# Patient Record
Sex: Female | Born: 1975 | Race: Black or African American | Hispanic: No | Smoking: Current every day smoker
Health system: Southern US, Community
[De-identification: ages and names within clinical notes are randomized; demographics above are authoritative.]

## PROBLEM LIST (undated history)

## (undated) ENCOUNTER — Inpatient Hospital Stay (HOSPITAL_COMMUNITY): Payer: Self-pay

## (undated) DIAGNOSIS — R519 Headache, unspecified: Secondary | ICD-10-CM

## (undated) DIAGNOSIS — M5430 Sciatica, unspecified side: Secondary | ICD-10-CM

## (undated) DIAGNOSIS — E119 Type 2 diabetes mellitus without complications: Secondary | ICD-10-CM

## (undated) DIAGNOSIS — Z87442 Personal history of urinary calculi: Secondary | ICD-10-CM

## (undated) DIAGNOSIS — K219 Gastro-esophageal reflux disease without esophagitis: Secondary | ICD-10-CM

## (undated) DIAGNOSIS — I10 Essential (primary) hypertension: Secondary | ICD-10-CM

## (undated) DIAGNOSIS — D649 Anemia, unspecified: Secondary | ICD-10-CM

## (undated) HISTORY — PX: COLPOSCOPY: SHX161

## (undated) HISTORY — PX: CERVICAL BIOPSY: SHX590

## (undated) HISTORY — DX: Sciatica, unspecified side: M54.30

---

## 1998-05-29 ENCOUNTER — Encounter: Admission: RE | Admit: 1998-05-29 | Discharge: 1998-05-29 | Payer: Self-pay | Admitting: Internal Medicine

## 2004-10-05 ENCOUNTER — Emergency Department (HOSPITAL_COMMUNITY): Admission: EM | Admit: 2004-10-05 | Discharge: 2004-10-05 | Payer: Self-pay | Admitting: Emergency Medicine

## 2009-05-19 ENCOUNTER — Emergency Department (HOSPITAL_COMMUNITY): Admission: EM | Admit: 2009-05-19 | Discharge: 2009-05-19 | Payer: Self-pay | Admitting: Emergency Medicine

## 2010-01-17 ENCOUNTER — Emergency Department (HOSPITAL_COMMUNITY): Admission: EM | Admit: 2010-01-17 | Discharge: 2010-01-18 | Payer: Self-pay | Admitting: Emergency Medicine

## 2010-10-12 LAB — POCT I-STAT, CHEM 8
Calcium, Ion: 1.2 mmol/L (ref 1.12–1.32)
Chloride: 107 mEq/L (ref 96–112)
Creatinine, Ser: 0.9 mg/dL (ref 0.4–1.2)
Glucose, Bld: 79 mg/dL (ref 70–99)
Hemoglobin: 12.6 g/dL (ref 12.0–15.0)
Potassium: 3.1 mEq/L — ABNORMAL LOW (ref 3.5–5.1)
TCO2: 23 mmol/L (ref 0–100)

## 2010-10-12 LAB — URINALYSIS, ROUTINE W REFLEX MICROSCOPIC
Glucose, UA: NEGATIVE mg/dL
Ketones, ur: 15 mg/dL — AB
Leukocytes, UA: NEGATIVE
Nitrite: NEGATIVE
Protein, ur: NEGATIVE mg/dL
Specific Gravity, Urine: 1.027 (ref 1.005–1.030)
Urobilinogen, UA: 1 mg/dL (ref 0.0–1.0)
pH: 5.5 (ref 5.0–8.0)

## 2010-10-12 LAB — POCT PREGNANCY, URINE: Preg Test, Ur: NEGATIVE

## 2010-10-12 LAB — URINE MICROSCOPIC-ADD ON

## 2010-10-30 LAB — URINE MICROSCOPIC-ADD ON

## 2010-10-30 LAB — URINALYSIS, ROUTINE W REFLEX MICROSCOPIC
Nitrite: NEGATIVE
Specific Gravity, Urine: 1.026 (ref 1.005–1.030)
Urobilinogen, UA: 1 mg/dL (ref 0.0–1.0)
pH: 6.5 (ref 5.0–8.0)

## 2010-10-30 LAB — CBC
HCT: 39.5 % (ref 36.0–46.0)
Hemoglobin: 13.7 g/dL (ref 12.0–15.0)
MCHC: 34.7 g/dL (ref 30.0–36.0)
MCV: 101 fL — ABNORMAL HIGH (ref 78.0–100.0)
RDW: 11.9 % (ref 11.5–15.5)
WBC: 6.6 10*3/uL (ref 4.0–10.5)

## 2010-10-30 LAB — DIFFERENTIAL
Lymphocytes Relative: 31 % (ref 12–46)
Lymphs Abs: 2.1 10*3/uL (ref 0.7–4.0)
Monocytes Absolute: 0.3 10*3/uL (ref 0.1–1.0)
Monocytes Relative: 5 % (ref 3–12)
Neutrophils Relative %: 63 % (ref 43–77)

## 2010-10-30 LAB — POCT I-STAT, CHEM 8
Chloride: 110 mEq/L (ref 96–112)
Hemoglobin: 14.3 g/dL (ref 12.0–15.0)
Potassium: 3.3 mEq/L — ABNORMAL LOW (ref 3.5–5.1)
Sodium: 140 mEq/L (ref 135–145)
TCO2: 21 mmol/L (ref 0–100)

## 2010-10-30 LAB — ETHANOL: Alcohol, Ethyl (B): <5 mg/dL (ref 0–10)

## 2010-10-30 LAB — POCT PREGNANCY, URINE: Preg Test, Ur: NEGATIVE

## 2011-11-11 ENCOUNTER — Emergency Department (HOSPITAL_COMMUNITY): Payer: BC Managed Care – PPO

## 2011-11-11 ENCOUNTER — Encounter (HOSPITAL_COMMUNITY): Payer: Self-pay | Admitting: Emergency Medicine

## 2011-11-11 ENCOUNTER — Emergency Department (HOSPITAL_COMMUNITY)
Admission: EM | Admit: 2011-11-11 | Discharge: 2011-11-12 | Disposition: A | Discharge status: Home / Self Care | Payer: BC Managed Care – PPO | Attending: Emergency Medicine | Admitting: Emergency Medicine

## 2011-11-11 DIAGNOSIS — Z79899 Other long term (current) drug therapy: Secondary | ICD-10-CM | POA: Insufficient documentation

## 2011-11-11 DIAGNOSIS — I10 Essential (primary) hypertension: Secondary | ICD-10-CM | POA: Insufficient documentation

## 2011-11-11 DIAGNOSIS — R0602 Shortness of breath: Secondary | ICD-10-CM | POA: Insufficient documentation

## 2011-11-11 DIAGNOSIS — R064 Hyperventilation: Secondary | ICD-10-CM | POA: Insufficient documentation

## 2011-11-11 DIAGNOSIS — R209 Unspecified disturbances of skin sensation: Secondary | ICD-10-CM | POA: Insufficient documentation

## 2011-11-11 DIAGNOSIS — R0789 Other chest pain: Secondary | ICD-10-CM

## 2011-11-11 DIAGNOSIS — Z7982 Long term (current) use of aspirin: Secondary | ICD-10-CM | POA: Insufficient documentation

## 2011-11-11 DIAGNOSIS — R071 Chest pain on breathing: Secondary | ICD-10-CM | POA: Insufficient documentation

## 2011-11-11 DIAGNOSIS — M25519 Pain in unspecified shoulder: Secondary | ICD-10-CM | POA: Insufficient documentation

## 2011-11-11 DIAGNOSIS — E876 Hypokalemia: Secondary | ICD-10-CM | POA: Insufficient documentation

## 2011-11-11 DIAGNOSIS — M79609 Pain in unspecified limb: Secondary | ICD-10-CM | POA: Insufficient documentation

## 2011-11-11 DIAGNOSIS — R110 Nausea: Secondary | ICD-10-CM | POA: Insufficient documentation

## 2011-11-11 HISTORY — DX: Essential (primary) hypertension: I10

## 2011-11-11 LAB — BASIC METABOLIC PANEL
Chloride: 97 mEq/L (ref 96–112)
GFR calc Af Amer: >90 mL/min (ref >90)
GFR calc non Af Amer: >90 mL/min (ref >90)
Potassium: 2.4 mEq/L — CL (ref 3.5–5.1)
Sodium: 139 mEq/L (ref 135–145)

## 2011-11-11 LAB — DIFFERENTIAL
Basophils Absolute: 0 10*3/uL (ref 0.0–0.1)
Basophils Relative: 0 % (ref 0–1)
Eosinophils Absolute: 0.1 10*3/uL (ref 0.0–0.7)
Monocytes Relative: 7 % (ref 3–12)
Neutro Abs: 5.1 10*3/uL (ref 1.7–7.7)
Neutrophils Relative %: 53 % (ref 43–77)

## 2011-11-11 LAB — TROPONIN I: Troponin I: <0.3 ng/mL (ref <0.30)

## 2011-11-11 LAB — CBC
Hemoglobin: 12.1 g/dL (ref 12.0–15.0)
MCH: 32.9 pg (ref 26.0–34.0)
MCHC: 34.3 g/dL (ref 30.0–36.0)
Platelets: 281 10*3/uL (ref 150–400)

## 2011-11-11 LAB — D-DIMER, QUANTITATIVE: D-Dimer, Quant: <0.22 ug/mL-FEU (ref 0.00–0.48)

## 2011-11-11 MED ORDER — IBUPROFEN 600 MG PO TABS: 600.0000 mg | ORAL_TABLET | Freq: Four times a day (QID) | ORAL | Status: AC | PRN

## 2011-11-11 MED ORDER — POTASSIUM CHLORIDE CRYS ER 20 MEQ PO TBCR: EXTENDED_RELEASE_TABLET | ORAL | Status: DC

## 2011-11-11 MED ORDER — SODIUM CHLORIDE 0.9 % IV SOLN
INTRAVENOUS | Status: DC
  Administered 2011-11-11: 125 mL/h via INTRAVENOUS

## 2011-11-11 MED ORDER — METHOCARBAMOL 500 MG PO TABS: ORAL_TABLET | ORAL | Status: DC

## 2011-11-11 MED ORDER — CYCLOBENZAPRINE HCL 10 MG PO TABS
10.0000 mg | ORAL_TABLET | Freq: Once | ORAL | Status: AC
  Administered 2011-11-11: 10 mg via ORAL
  Filled 2011-11-11: qty 1

## 2011-11-11 MED ORDER — OMEPRAZOLE 20 MG PO CPDR: 20.0000 mg | DELAYED_RELEASE_CAPSULE | Freq: Every day | ORAL | Status: DC

## 2011-11-11 MED ORDER — POTASSIUM CHLORIDE CRYS ER 20 MEQ PO TBCR
40.0000 meq | EXTENDED_RELEASE_TABLET | Freq: Once | ORAL | Status: AC
  Administered 2011-11-12: 40 meq via ORAL
  Filled 2011-11-11: qty 2

## 2011-11-11 MED ORDER — KETOROLAC TROMETHAMINE 30 MG/ML IJ SOLN
30.0000 mg | Freq: Once | INTRAMUSCULAR | Status: AC
  Administered 2011-11-11: 30 mg via INTRAVENOUS
  Filled 2011-11-11: qty 1

## 2011-11-11 NOTE — Discharge Instructions (Signed)
Take the potassium pills as directed. You  will probably need to be on them as long as you are taking the hydrochlorothiazide for your blood pressure. Take the other medications for your chest wall pain. Recheck as needed.

## 2011-11-11 NOTE — ED Notes (Signed)
Per EMS-chest pain radiating to left side, nausea resolved-been doing heavy lifting at work-bp 158/100

## 2011-11-11 NOTE — ED Notes (Signed)
AVW:[id number]<XB> Expected date:11/11/11<BR> Expected time:<BR> Means of arrival:<BR> Comments:<BR> EMS 10 GC - chest wall pain

## 2011-11-11 NOTE — ED Provider Notes (Signed)
History     CSN: [id number]  Arrival date & time 11/11/11  1859   First MD Initiated Contact with Patient 11/11/11 2052      Chief Complaint  Patient presents with  . Chest Pain    (Consider location/radiation/quality/duration/timing/severity/associated sxs/prior treatment) HPI  Patient relates last night when she was sitting she started getting pain in her left chest that went up into her left shoulder and down into her left arm and her back. She states it lasted about an hour before she fell asleep. She relates she did not have it this morning however her chest was sore. She relates she was at work and she has difficulty with her employer and they were having some words. This is about 5:30 PM and she states the chest pain got more intense and restarted. She states it's sharp and a pressure feeling and she felt like her heart was skipping and beating. She states she started feeling short of breath and hyperventilated and her toes feel tingly. She had nausea without vomiting or sweating. She states she's never had this before. She states certain movements make the pain worse and laying flat on her back makes the pain feel better. She relates she was on Depo-Provera but her last injection was in August. Patient also relates her stepfather has been in the hospital the past 3 days.  PCP Dr. Eula Listen  Past Medical History  Diagnosis Date  . Hypertension     No past surgical history on file.  No family history on file. Maternal grandmother died at age 43 from an MI  maternal uncle died in his 81s of a brain aneurysm Paternal grandfather died age 58 of cancer Mother of patient, maternal uncle, maternal aunt, and 2 brothers have diabetes    History  Substance Use Topics  . Smoking status: Smokes 1/4 ppd  . Smokeless tobacco: Current User  . Alcohol Use: Yes  employed  OB History    Grav Para Term Preterm Abortions TAB SAB Ect Mult Living                  Review of Systems    All other systems reviewed and are negative.    Allergies  Review of patient's allergies indicates no known allergies.  Home Medications   Current Outpatient Rx  Name Route Sig Dispense Refill  . AMLODIPINE BESYLATE 10 MG PO TABS Oral Take 10 mg by mouth daily.    . ASPIRIN EC 81 MG PO TBEC Oral Take 81 mg by mouth daily.    Marland Kitchen HYDROCHLOROTHIAZIDE 25 MG PO TABS Oral Take 25 mg by mouth daily.      BP 119/73  Pulse 87  Temp 98.6 F (37 C)  Resp 22  Ht 5\' 5"  (1.651 m)  Wt 218 lb (98.884 kg)  BMI 36.28 kg/m2  SpO2 100%  LMP 10/28/2011  Vital signs normal    Physical Exam  Nursing note and vitals reviewed. Constitutional: She is oriented to person, place, and time. She appears well-developed and well-nourished.  Non-toxic appearance. She does not appear ill. No distress.  HENT:  Head: Normocephalic and atraumatic.  Right Ear: External ear normal.  Left Ear: External ear normal.  Nose: Nose normal. No mucosal edema or rhinorrhea.  Mouth/Throat: Oropharynx is clear and moist and mucous membranes are normal. No dental abscesses or uvula swelling.  Eyes: Conjunctivae and EOM are normal. Pupils are equal, round, and reactive to light.  Neck: Normal range of motion and  full passive range of motion without pain. Neck supple.  Cardiovascular: Normal rate, regular rhythm and normal heart sounds.  Exam reveals no gallop and no friction rub.   No murmur heard. Pulmonary/Chest: Effort normal and breath sounds normal. No respiratory distress. She has no wheezes. She has no rhonchi. She has no rales. She exhibits tenderness. She exhibits no crepitus.       Tender left upper chest wall to palpation.  Abdominal: Soft. Normal appearance and bowel sounds are normal. She exhibits no distension. There is no tenderness. There is no rebound and no guarding.  Musculoskeletal: Normal range of motion. She exhibits no edema and no tenderness.       Moves all extremities well.   Neurological: She  is alert and oriented to person, place, and time. She has normal strength. No cranial nerve deficit.  Skin: Skin is warm, dry and intact. No rash noted. No erythema. No pallor.  Psychiatric: She has a normal mood and affect. Her speech is normal and behavior is normal. Her mood appears not anxious.    ED Course  Procedures (including critical care time)   Medications  0.9 %  sodium chloride infusion (125 mL/hr Intravenous New Bag/Given 11/11/11 2223)  potassium chloride SA (K-DUR,KLOR-CON) CR tablet 40 mEq (not administered)  ketorolac (TORADOL) 30 MG/ML injection 30 mg (30 mg Intravenous Given 11/11/11 2224)  cyclobenzaprine (FLEXERIL) tablet 10 mg (10 mg Oral Given 11/11/11 2224)   Recheck states her pain is better, denies cramping.  MOP in room and upset when I discussed getting IV potassium, states she has been with her ill husband in the hospital and working and can't stay in the ED until 1 am for her to get IV potassium ( I was going to order several runs).   Results for orders placed during the hospital encounter of 11/11/11  CBC      Component Value Range   WBC 9.6  4.0 - 10.5 (K/uL)   RBC 3.68 (*) 3.87 - 5.11 (MIL/uL)   Hemoglobin 12.1  12.0 - 15.0 (g/dL)   HCT 16.1 (*) 09.6 - 46.0 (%)   MCV 95.9  78.0 - 100.0 (fL)   MCH 32.9  26.0 - 34.0 (pg)   MCHC 34.3  30.0 - 36.0 (g/dL)   RDW 04.5  40.9 - 81.1 (%)   Platelets 281  150 - 400 (K/uL)  DIFFERENTIAL      Component Value Range   Neutrophils Relative 53  43 - 77 (%)   Neutro Abs 5.1  1.7 - 7.7 (K/uL)   Lymphocytes Relative 39  12 - 46 (%)   Lymphs Abs 3.7  0.7 - 4.0 (K/uL)   Monocytes Relative 7  3 - 12 (%)   Monocytes Absolute 0.7  0.1 - 1.0 (K/uL)   Eosinophils Relative 1  0 - 5 (%)   Eosinophils Absolute 0.1  0.0 - 0.7 (K/uL)   Basophils Relative 0  0 - 1 (%)   Basophils Absolute 0.0  0.0 - 0.1 (K/uL)  BASIC METABOLIC PANEL      Component Value Range   Sodium 139  135 - 145 (mEq/L)   Potassium 2.4 (*) 3.5 - 5.1  (mEq/L)   Chloride 97  96 - 112 (mEq/L)   CO2 32  19 - 32 (mEq/L)   Glucose, Bld 98  70 - 99 (mg/dL)   BUN 12  6 - 23 (mg/dL)   Creatinine, Ser 9.14  0.50 - 1.10 (mg/dL)   Calcium 10.4  8.4 - 10.5 (mg/dL)   GFR calc non Af Amer >90  >90 (mL/min)   GFR calc Af Amer >90  >90 (mL/min)  TROPONIN I      Component Value Range   Troponin I <0.30  <0.30 (ng/mL)  D-DIMER, QUANTITATIVE      Component Value Range   D-Dimer, Quant <0.22  0.00 - 0.48 (ug/mL-FEU)   Laboratory interpretation all normal except hypokalemia   Dg Chest 2 View  11/11/2011  *RADIOLOGY REPORT*  Clinical Data: Chest pain and shortness of breath.  CHEST - 2 VIEW  Comparison: CT and plain film chest 05/19/2009.  Findings: Lungs are clear.  Heart size is normal.  No pneumothorax or effusion.  IMPRESSION: Negative chest.  Original Report Authenticated By: Bernadene Bell. Maricela Curet, M.D.    Date: 11/11/2011  Rate: 94  Rhythm: normal sinus rhythm  QRS Axis: normal  Intervals: normal  ST/T Wave abnormalities: normal  Conduction Disutrbances:none  Narrative Interpretation:   Old EKG Reviewed: none available    1. Chest wall pain   2. Hypokalemia    New Prescriptions   IBUPROFEN (ADVIL,MOTRIN) 600 MG TABLET    Take 1 tablet (600 mg total) by mouth every 6 (six) hours as needed for pain.   METHOCARBAMOL (ROBAXIN) 500 MG TABLET    Take 1 or 2 po Q 6hrs for pain   OMEPRAZOLE (PRILOSEC) 20 MG CAPSULE    Take 1 capsule (20 mg total) by mouth daily.   POTASSIUM CHLORIDE SA (K-DUR,KLOR-CON) 20 MEQ TABLET    Take 2 BID x 7 days then once a day   Plan discharge Devoria Albe, MD, FACEP    MDM          Ward Givens, MD 11/12/11 0002

## 2012-10-10 ENCOUNTER — Emergency Department (HOSPITAL_COMMUNITY)
Admission: EM | Admit: 2012-10-10 | Discharge: 2012-10-11 | Disposition: A | Discharge status: Home / Self Care | Payer: BC Managed Care – PPO | Attending: Emergency Medicine | Admitting: Emergency Medicine

## 2012-10-10 ENCOUNTER — Emergency Department (HOSPITAL_COMMUNITY): Payer: BC Managed Care – PPO

## 2012-10-10 ENCOUNTER — Encounter (HOSPITAL_COMMUNITY): Payer: Self-pay | Admitting: *Deleted

## 2012-10-10 DIAGNOSIS — Z7982 Long term (current) use of aspirin: Secondary | ICD-10-CM | POA: Insufficient documentation

## 2012-10-10 DIAGNOSIS — I10 Essential (primary) hypertension: Secondary | ICD-10-CM | POA: Insufficient documentation

## 2012-10-10 DIAGNOSIS — R5381 Other malaise: Secondary | ICD-10-CM | POA: Insufficient documentation

## 2012-10-10 DIAGNOSIS — R209 Unspecified disturbances of skin sensation: Secondary | ICD-10-CM | POA: Insufficient documentation

## 2012-10-10 DIAGNOSIS — F1210 Cannabis abuse, uncomplicated: Secondary | ICD-10-CM | POA: Insufficient documentation

## 2012-10-10 DIAGNOSIS — K219 Gastro-esophageal reflux disease without esophagitis: Secondary | ICD-10-CM | POA: Insufficient documentation

## 2012-10-10 DIAGNOSIS — R0789 Other chest pain: Secondary | ICD-10-CM | POA: Insufficient documentation

## 2012-10-10 DIAGNOSIS — Z79899 Other long term (current) drug therapy: Secondary | ICD-10-CM | POA: Insufficient documentation

## 2012-10-10 DIAGNOSIS — E876 Hypokalemia: Secondary | ICD-10-CM | POA: Insufficient documentation

## 2012-10-10 HISTORY — DX: Gastro-esophageal reflux disease without esophagitis: K21.9

## 2012-10-10 LAB — COMPREHENSIVE METABOLIC PANEL
ALT: 11 U/L (ref 0–35)
AST: 17 U/L (ref 0–37)
Alkaline Phosphatase: 107 U/L (ref 39–117)
CO2: 29 mEq/L (ref 19–32)
Calcium: 10.6 mg/dL — ABNORMAL HIGH (ref 8.4–10.5)
GFR calc non Af Amer: 78 mL/min — ABNORMAL LOW (ref >90)
Potassium: 2.8 mEq/L — ABNORMAL LOW (ref 3.5–5.1)
Sodium: 137 mEq/L (ref 135–145)
Total Protein: 8.8 g/dL — ABNORMAL HIGH (ref 6.0–8.3)

## 2012-10-10 LAB — CBC WITH DIFFERENTIAL/PLATELET
Basophils Absolute: 0 10*3/uL (ref 0.0–0.1)
Eosinophils Relative: 2 % (ref 0–5)
Lymphocytes Relative: 42 % (ref 12–46)
Lymphs Abs: 3.1 10*3/uL (ref 0.7–4.0)
MCV: 96.6 fL (ref 78.0–100.0)
Neutrophils Relative %: 49 % (ref 43–77)
Platelets: 292 10*3/uL (ref 150–400)
RBC: 4.11 MIL/uL (ref 3.87–5.11)
RDW: 12.6 % (ref 11.5–15.5)
WBC: 7.3 10*3/uL (ref 4.0–10.5)

## 2012-10-10 NOTE — ED Notes (Signed)
Pt states that she has been having CP for a couple of days now. Pt tried taking her GERD medication on Sat and that did not help with the pain. Pt states that the pain is in the center of her chest and radiates down her right arm.

## 2012-10-11 MED ORDER — POTASSIUM CHLORIDE CRYS ER 20 MEQ PO TBCR
40.0000 meq | EXTENDED_RELEASE_TABLET | Freq: Once | ORAL | Status: AC
  Administered 2012-10-11: 40 meq via ORAL
  Filled 2012-10-11: qty 2

## 2012-10-11 MED ORDER — POTASSIUM CHLORIDE 20 MEQ/15ML (10%) PO LIQD
40.0000 meq | Freq: Once | ORAL | Status: DC
  Filled 2012-10-11: qty 30

## 2012-10-11 MED ORDER — POTASSIUM CHLORIDE ER 10 MEQ PO TBCR: 10.0000 meq | EXTENDED_RELEASE_TABLET | Freq: Two times a day (BID) | ORAL | Status: DC

## 2012-10-11 MED ORDER — IBUPROFEN 800 MG PO TABS
800.0000 mg | ORAL_TABLET | Freq: Once | ORAL | Status: AC
  Administered 2012-10-11: 800 mg via ORAL
  Filled 2012-10-11: qty 1

## 2012-10-11 MED ORDER — POTASSIUM CHLORIDE 20 MEQ/15ML (10%) PO LIQD: 20.0000 meq | Freq: Two times a day (BID) | ORAL | Status: DC

## 2012-10-11 NOTE — ED Provider Notes (Signed)
History     CSN: [id number]  Arrival date & time 10/10/12  1939   First MD Initiated Contact with Patient 10/10/12 2353      Chief Complaint  Patient presents with  . Chest Pain    (Consider location/radiation/quality/duration/timing/severity/associated sxs/prior treatment) HPI 37 yo female presents to the ER with complaint of tingling in hands, face, chest pressure extending into both arms and back, generalized weakness, and "feeling weird".  Pt with similar presentation about a year ago.  She has been taking her prevacid without improvement in symptoms.  No fever, chills, cough.  She has had mild sob.  No sxs at present.  Chest discomfort started yesterday, other symptoms today.  Pt reports feeling "washed out" Past Medical History  Diagnosis Date  . Hypertension   . GERD (gastroesophageal reflux disease)     History reviewed. No pertinent past surgical history.  History reviewed. No pertinent family history.  History  Substance Use Topics  . Smoking status: Not on file  . Smokeless tobacco: Current User  . Alcohol Use: Yes    OB History   Grav Para Term Preterm Abortions TAB SAB Ect Mult Living                  Review of Systems  All other systems reviewed and are negative.    Allergies  Review of patient's allergies indicates no known allergies.  Home Medications   Current Outpatient Rx  Name  Route  Sig  Dispense  Refill  . amLODipine (NORVASC) 10 MG tablet   Oral   Take 10 mg by mouth daily.         Marland Kitchen aspirin EC 81 MG tablet   Oral   Take 81 mg by mouth daily.         . hydrochlorothiazide (HYDRODIURIL) 25 MG tablet   Oral   Take 25 mg by mouth daily.         . methocarbamol (ROBAXIN) 500 MG tablet   Oral   Take 500 mg by mouth every 6 (six) hours as needed (pain).         Marland Kitchen omeprazole (PRILOSEC) 20 MG capsule   Oral   Take 1 capsule (20 mg total) by mouth daily.   30 capsule   0     BP 136/95  Pulse 96  Temp(Src) 97.6 F  (36.4 C) (Oral)  Resp 16  SpO2 100%  Physical Exam  Nursing note and vitals reviewed. Constitutional: She is oriented to person, place, and time. She appears well-developed and well-nourished.  HENT:  Head: Normocephalic and atraumatic.  Nose: Nose normal.  Mouth/Throat: Oropharynx is clear and moist.  Eyes: Conjunctivae and EOM are normal. Pupils are equal, round, and reactive to light.  Neck: Normal range of motion. Neck supple. No JVD present. No tracheal deviation present. No thyromegaly present.  Cardiovascular: Normal rate, regular rhythm, normal heart sounds and intact distal pulses.  Exam reveals no gallop and no friction rub.   No murmur heard. Pulmonary/Chest: Effort normal and breath sounds normal. No stridor. No respiratory distress. She has no wheezes. She has no rales. She exhibits no tenderness.  Abdominal: Soft. Bowel sounds are normal. She exhibits no distension and no mass. There is no tenderness. There is no rebound and no guarding.  Musculoskeletal: Normal range of motion. She exhibits no edema and no tenderness.  Lymphadenopathy:    She has no cervical adenopathy.  Neurological: She is alert and oriented to person, place, and  time. No cranial nerve deficit. She exhibits normal muscle tone. Coordination normal.  Skin: Skin is warm and dry. No rash noted. No erythema. No pallor.  Psychiatric: She has a normal mood and affect. Her behavior is normal. Judgment and thought content normal.    ED Course  Procedures (including critical care time)  Labs Reviewed  CBC WITH DIFFERENTIAL - Abnormal; Notable for the following:    MCH 34.1 (*)    All other components within normal limits  COMPREHENSIVE METABOLIC PANEL - Abnormal; Notable for the following:    Potassium 2.8 (*)    Chloride 95 (*)    Glucose, Bld 100 (*)    Calcium 10.6 (*)    Total Protein 8.8 (*)    GFR calc non Af Amer 78 (*)    All other components within normal limits  POCT I-STAT TROPONIN I   Dg  Chest 2 View  10/10/2012  *RADIOLOGY REPORT*  Clinical Data: Chest pain and short of breath  CHEST - 2 VIEW  Comparison: 11/11/2011  Findings: Normal heart size and normal vascularity.  Lungs are clear.  Negative for pneumonia or effusion.  IMPRESSION: Negative   Original Report Authenticated By: Janeece Riggers, M.D.     Date: 10/10/2012  Rate: 108  Rhythm: sinus tachycardia  QRS Axis: normal  Intervals: QT prolonged  ST/T Wave abnormalities: normal  Conduction Disutrbances:none  Narrative Interpretation:   Old EKG Reviewed: changes noted    1. Hypokalemia   2. Atypical chest pain       MDM  37 yo female with vague symptoms, chest discomfort.  Found to have hypokalemia, otherwise unremarkable exam and workup.  Will d/c with potassium repletion, close f/u with her pcm.  Do not feel sxs are due to dissection, PE, acs.          Olivia Mackie, MD 10/11/12 [phone]

## 2012-10-20 ENCOUNTER — Emergency Department (INDEPENDENT_AMBULATORY_CARE_PROVIDER_SITE_OTHER)
Admission: EM | Admit: 2012-10-20 | Discharge: 2012-10-20 | Disposition: A | Discharge status: Home / Self Care | Payer: BC Managed Care – PPO | Source: Home / Self Care | Attending: Family Medicine | Admitting: Family Medicine

## 2012-10-20 ENCOUNTER — Encounter (HOSPITAL_COMMUNITY): Payer: Self-pay | Admitting: *Deleted

## 2012-10-20 DIAGNOSIS — J020 Streptococcal pharyngitis: Secondary | ICD-10-CM

## 2012-10-20 MED ORDER — AMOXICILLIN 500 MG PO CAPS: 500.0000 mg | ORAL_CAPSULE | Freq: Three times a day (TID) | ORAL | Status: DC

## 2012-10-20 NOTE — ED Provider Notes (Signed)
History     CSN: [id number]  Arrival date & time 10/20/12  1524   First MD Initiated Contact with Patient 10/20/12 1543      No chief complaint on file.   (Consider location/radiation/quality/duration/timing/severity/associated sxs/prior treatment) Patient is a 37 y.o. female presenting with pharyngitis. The history is provided by the patient.  Sore Throat This is a new problem. The current episode started more than 2 days ago. The problem has been gradually worsening. The symptoms are aggravated by swallowing.    Past Medical History  Diagnosis Date  . Hypertension   . GERD (gastroesophageal reflux disease)     No past surgical history on file.  No family history on file.  History  Substance Use Topics  . Smoking status: Not on file  . Smokeless tobacco: Current User  . Alcohol Use: Yes    OB History   Grav Para Term Preterm Abortions TAB SAB Ect Mult Living                  Review of Systems  Constitutional: Positive for fever, chills and appetite change.  HENT: Positive for sore throat. Negative for congestion and rhinorrhea.   Cardiovascular: Negative.   Gastrointestinal: Negative.   Musculoskeletal: Negative.   Skin: Negative.   Hematological: Positive for adenopathy.    Allergies  Review of patient's allergies indicates no known allergies.  Home Medications   Current Outpatient Rx  Name  Route  Sig  Dispense  Refill  . amLODipine (NORVASC) 10 MG tablet   Oral   Take 10 mg by mouth daily.         Marland Kitchen amoxicillin (AMOXIL) 500 MG capsule   Oral   Take 1 capsule (500 mg total) by mouth 3 (three) times daily.   30 capsule   0   . aspirin EC 81 MG tablet   Oral   Take 81 mg by mouth daily.         . hydrochlorothiazide (HYDRODIURIL) 25 MG tablet   Oral   Take 25 mg by mouth daily.         . methocarbamol (ROBAXIN) 500 MG tablet   Oral   Take 500 mg by mouth every 6 (six) hours as needed (pain).         Marland Kitchen omeprazole (PRILOSEC) 20 MG  capsule   Oral   Take 1 capsule (20 mg total) by mouth daily.   30 capsule   0   . potassium chloride (K-DUR) 10 MEQ tablet   Oral   Take 1 tablet (10 mEq total) by mouth 2 (two) times daily.   20 tablet   0     BP 128/65  Pulse 111  Temp(Src) 98.6 F (37 C) (Oral)  Resp 16  SpO2 100%  Physical Exam  Nursing note and vitals reviewed. Constitutional: She is oriented to person, place, and time. She appears well-developed and well-nourished. No distress.  HENT:  Head: Normocephalic.  Right Ear: External ear normal.  Left Ear: External ear normal.  Mouth/Throat: Uvula is midline and mucous membranes are normal. No edematous. Oropharyngeal exudate present.  Neck: Normal range of motion. Neck supple.  Pulmonary/Chest: Breath sounds normal.  Lymphadenopathy:    She has cervical adenopathy.  Neurological: She is alert and oriented to person, place, and time.  Skin: Skin is warm and dry. No rash noted.    ED Course  Procedures (including critical care time)  Labs Reviewed - No data to display No results found.  1. Strep sore throat       MDM          Linna Hoff, MD 10/20/12 [phone]

## 2012-10-20 NOTE — ED Notes (Signed)
C/o sore throat onset Sat.  She thought it was a cold,  C/o chills fever and sweating.  Hasn't eaten much since Sat.  C/o body weakness and aching.  C/o headache and pain in R TMJ.

## 2013-10-17 ENCOUNTER — Emergency Department (HOSPITAL_COMMUNITY)
Admission: EM | Admit: 2013-10-17 | Discharge: 2013-10-17 | Disposition: A | Discharge status: Home / Self Care | Payer: BC Managed Care – PPO | Attending: Emergency Medicine | Admitting: Emergency Medicine

## 2013-10-17 ENCOUNTER — Emergency Department (HOSPITAL_COMMUNITY): Payer: BC Managed Care – PPO

## 2013-10-17 ENCOUNTER — Encounter (HOSPITAL_COMMUNITY): Payer: Self-pay | Admitting: Emergency Medicine

## 2013-10-17 DIAGNOSIS — I10 Essential (primary) hypertension: Secondary | ICD-10-CM | POA: Insufficient documentation

## 2013-10-17 DIAGNOSIS — Z7982 Long term (current) use of aspirin: Secondary | ICD-10-CM | POA: Insufficient documentation

## 2013-10-17 DIAGNOSIS — Z792 Long term (current) use of antibiotics: Secondary | ICD-10-CM | POA: Insufficient documentation

## 2013-10-17 DIAGNOSIS — K219 Gastro-esophageal reflux disease without esophagitis: Secondary | ICD-10-CM | POA: Insufficient documentation

## 2013-10-17 DIAGNOSIS — E876 Hypokalemia: Secondary | ICD-10-CM | POA: Insufficient documentation

## 2013-10-17 DIAGNOSIS — E669 Obesity, unspecified: Secondary | ICD-10-CM | POA: Insufficient documentation

## 2013-10-17 DIAGNOSIS — D649 Anemia, unspecified: Secondary | ICD-10-CM | POA: Insufficient documentation

## 2013-10-17 DIAGNOSIS — R5381 Other malaise: Secondary | ICD-10-CM | POA: Insufficient documentation

## 2013-10-17 DIAGNOSIS — Z79899 Other long term (current) drug therapy: Secondary | ICD-10-CM | POA: Insufficient documentation

## 2013-10-17 DIAGNOSIS — M940 Chondrocostal junction syndrome [Tietze]: Secondary | ICD-10-CM | POA: Insufficient documentation

## 2013-10-17 DIAGNOSIS — R195 Other fecal abnormalities: Secondary | ICD-10-CM | POA: Insufficient documentation

## 2013-10-17 DIAGNOSIS — F17200 Nicotine dependence, unspecified, uncomplicated: Secondary | ICD-10-CM | POA: Insufficient documentation

## 2013-10-17 DIAGNOSIS — R5383 Other fatigue: Secondary | ICD-10-CM

## 2013-10-17 LAB — BASIC METABOLIC PANEL
BUN: 12 mg/dL (ref 6–23)
CALCIUM: 9.7 mg/dL (ref 8.4–10.5)
CO2: 28 meq/L (ref 19–32)
CREATININE: 1 mg/dL (ref 0.50–1.10)
Chloride: 94 mEq/L — ABNORMAL LOW (ref 96–112)
GFR calc Af Amer: 82 mL/min — ABNORMAL LOW (ref >90)
GFR, EST NON AFRICAN AMERICAN: 71 mL/min — AB (ref >90)
Glucose, Bld: 85 mg/dL (ref 70–99)
Potassium: 2.5 mEq/L — CL (ref 3.7–5.3)
Sodium: 136 mEq/L — ABNORMAL LOW (ref 137–147)

## 2013-10-17 LAB — CBC
HCT: 31.6 % — ABNORMAL LOW (ref 36.0–46.0)
Hemoglobin: 10.9 g/dL — ABNORMAL LOW (ref 12.0–15.0)
MCH: 34.3 pg — ABNORMAL HIGH (ref 26.0–34.0)
MCHC: 34.5 g/dL (ref 30.0–36.0)
MCV: 99.4 fL (ref 78.0–100.0)
PLATELETS: 347 10*3/uL (ref 150–400)
RBC: 3.18 MIL/uL — AB (ref 3.87–5.11)
RDW: 13.3 % (ref 11.5–15.5)
WBC: 9.5 10*3/uL (ref 4.0–10.5)

## 2013-10-17 LAB — I-STAT TROPONIN, ED: TROPONIN I, POC: 0 ng/mL (ref 0.00–0.08)

## 2013-10-17 LAB — POC OCCULT BLOOD, ED: Fecal Occult Bld: POSITIVE — AB

## 2013-10-17 MED ORDER — POTASSIUM CHLORIDE CRYS ER 20 MEQ PO TBCR: 20.0000 meq | EXTENDED_RELEASE_TABLET | Freq: Every day | ORAL | Status: DC

## 2013-10-17 MED ORDER — POTASSIUM CHLORIDE 10 MEQ/100ML IV SOLN
10.0000 meq | Freq: Once | INTRAVENOUS | Status: AC
  Administered 2013-10-17: 10 meq via INTRAVENOUS
  Filled 2013-10-17: qty 100

## 2013-10-17 MED ORDER — POTASSIUM CHLORIDE CRYS ER 20 MEQ PO TBCR
40.0000 meq | EXTENDED_RELEASE_TABLET | Freq: Once | ORAL | Status: AC
  Administered 2013-10-17: 40 meq via ORAL
  Filled 2013-10-17: qty 2

## 2013-10-17 MED ORDER — KETOROLAC TROMETHAMINE 30 MG/ML IJ SOLN
30.0000 mg | Freq: Once | INTRAMUSCULAR | Status: AC
  Administered 2013-10-17: 30 mg via INTRAVENOUS
  Filled 2013-10-17: qty 1

## 2013-10-17 NOTE — ED Notes (Signed)
Pt states was at a funeral when pain started. Seen for same previously states pain more intense.

## 2013-10-17 NOTE — ED Provider Notes (Signed)
CSN: [id number]     Arrival date & time 10/17/13  1452 History   First MD Initiated Contact with Patient 10/17/13 1555     Chief Complaint  Patient presents with  . Chest Pain     (Consider location/radiation/quality/duration/timing/severity/associated sxs/prior Treatment) HPI Comments: Patient is a 38 year old female past medical history of hypertension and GERD who presents to the emergency department complaining of midsternal chest pain radiating to bilateral axilla beginning around 12:00 noon today while she was at a funeral. Pain has been constant since, described as sharp and aching, worse with certain movements, palpation and deep inspiration. She has not had any alleviating factors for her symptoms. Denies nausea, vomiting or diaphoresis. States she had an upper respiratory infection over the past week and was coughing a lot, these symptoms have since subsided. She is a smoker. States her grandmother died of early heart disease. Labs were obtained prior to patient being seen, it was noted that she had a hemoglobin of 10.9, 3 point drop in one year. Patient does not have a menstrual cycle as she is on the Depakote she, denies any rectal bleeding. Denies history of anemia. She is aware that she has low potassium after discussing potassium level of 2.5. She is supposed to be on potassium replacement, however does not take this. States she's had slight generalized weakness and fatigue.  The history is provided by the patient.    Past Medical History  Diagnosis Date  . Hypertension   . GERD (gastroesophageal reflux disease)    History reviewed. No pertinent past surgical history. Family History  Problem Relation Age of Onset  . Diabetes Mother   . Hypertension Mother   . GER disease Mother    History  Substance Use Topics  . Smoking status: Current Every Day Smoker -- 0.30 packs/day    Types: Cigarettes  . Smokeless tobacco: Never Used  . Alcohol Use: Yes     Comment: occasionally    OB History   Grav Para Term Preterm Abortions TAB SAB Ect Mult Living                 Review of Systems  Constitutional: Positive for fatigue.  Cardiovascular: Positive for chest pain.  Neurological: Positive for weakness.  All other systems reviewed and are negative.      Allergies  Review of patient's allergies indicates no known allergies.  Home Medications   Current Outpatient Rx  Name  Route  Sig  Dispense  Refill  . amLODipine (NORVASC) 10 MG tablet   Oral   Take 10 mg by mouth daily.         Marland Kitchen. aspirin EC 81 MG tablet   Oral   Take 81 mg by mouth daily.         . hydrochlorothiazide (HYDRODIURIL) 25 MG tablet   Oral   Take 25 mg by mouth daily.         Marland Kitchen. ibuprofen (ADVIL,MOTRIN) 800 MG tablet   Oral   Take 800 mg by mouth every 8 (eight) hours as needed for mild pain.         Marland Kitchen. omeprazole (PRILOSEC) 20 MG capsule   Oral   Take 20 mg by mouth daily.         Marland Kitchen. amoxicillin (AMOXIL) 500 MG capsule   Oral   Take 1 capsule (500 mg total) by mouth 3 (three) times daily.   30 capsule   0   . EXPIRED: omeprazole (PRILOSEC) 20 MG capsule  Oral   Take 1 capsule (20 mg total) by mouth daily.   30 capsule   0   . potassium chloride SA (K-DUR,KLOR-CON) 20 MEQ tablet   Oral   Take 1 tablet (20 mEq total) by mouth daily.   5 tablet   0    BP 96/63  Pulse 83  Temp(Src) 98.3 F (36.8 C)  Resp 15  Ht 5\' 6"  (1.676 m)  Wt 218 lb (98.884 kg)  BMI 35.20 kg/m2  SpO2 100% Physical Exam  Nursing note and vitals reviewed. Constitutional: She is oriented to person, place, and time. She appears well-developed and well-nourished. No distress.  Obese.  HENT:  Head: Normocephalic and atraumatic.  Mouth/Throat: Oropharynx is clear and moist.  Eyes: Conjunctivae and EOM are normal. Pupils are equal, round, and reactive to light.  Neck: Normal range of motion. Neck supple. No JVD present.  Cardiovascular: Normal rate, regular rhythm, normal heart  sounds and intact distal pulses.   No extremity edema.  Pulmonary/Chest: Effort normal and breath sounds normal. No respiratory distress. She exhibits tenderness (generalized tenderness across the entire chest).  Abdominal: Soft. Bowel sounds are normal. There is no tenderness.  Musculoskeletal: Normal range of motion. She exhibits no edema.  Neurological: She is alert and oriented to person, place, and time. She has normal strength. No sensory deficit.  Speech fluent, goal oriented. Moves limbs without ataxia. Equal grip strength bilateral.  Skin: Skin is warm and dry. She is not diaphoretic.  Psychiatric: She has a normal mood and affect. Her behavior is normal.    ED Course  Procedures (including critical care time) Labs Review Labs Reviewed  CBC - Abnormal; Notable for the following:    RBC 3.18 (*)    Hemoglobin 10.9 (*)    HCT 31.6 (*)    MCH 34.3 (*)    All other components within normal limits  BASIC METABOLIC PANEL - Abnormal; Notable for the following:    Sodium 136 (*)    Potassium 2.5 (*)    Chloride 94 (*)    GFR calc non Af Amer 71 (*)    GFR calc Af Amer 82 (*)    All other components within normal limits  POC OCCULT BLOOD, ED - Abnormal; Notable for the following:    Fecal Occult Bld POSITIVE (*)    All other components within normal limits  I-STAT TROPOININ, ED  POCT GASTRIC OCCULT BLOOD (1-CARD TO LAB)   Imaging Review Dg Chest 2 View  10/17/2013   CLINICAL DATA:  Chest pain, left-sided.  EXAM: CHEST  2 VIEW  COMPARISON:  10/10/2012  FINDINGS: Normal heart size and mediastinal contours. No acute infiltrate or edema. No effusion or pneumothorax. No acute osseous findings.  IMPRESSION: No active cardiopulmonary disease.   Electronically Signed   By: Tiburcio Pea M.D.   On: 10/17/2013 15:42     EKG Interpretation   Date/Time:  Tuesday October 17 2013 14:57:13 EDT Ventricular Rate:  92 PR Interval:  150 QRS Duration: 82 QT Interval:  360 QTC  Calculation: 445 R Axis:   40 Text Interpretation:  Normal sinus rhythm T wave abnormality, consider  inferior ischemia Abnormal ECG New T wave inversions Confirmed by  ZACKOWSKI  MD, SCOTT [phone]) on 10/17/2013 5:41:12 PM      MDM   Final diagnoses:  Costochondritis  Hypokalemia  Anemia  Occult blood positive stool   Patient presenting with reproducible chest pain beginning around 12 noon today while at a funeral.  She is well appearing and in no apparent distress, afebrile with normal vital signs. Labs obtained in triage as stated in history of present illness, will treat hypokalemia PO and IV, check stool for occult blood, give IV fluids and Toradol for pain. Doubt cardiac or pulmonary in origin, low risk, PERC negative. 6:56 PM Patient reports improvement of her chest pain after receiving Toradol. IV potassium completed. Stool positive for occult blood. EKG showing new T-wave inversions, however I have low suspicion for cardiac in origin. Heart score low risk 3. Patient stable for discharge, vitals remained stable. She will followup with both cardiology and gastroenterology. I prescribed her potassium orally. Return precautions given. Patient states understanding of treatment care plan and is agreeable.   Trevor Mace, PA-C 10/17/13 1858

## 2013-10-17 NOTE — ED Notes (Signed)
PA at bedside.

## 2013-10-17 NOTE — ED Notes (Signed)
Pt ambulates without distress.  

## 2013-10-17 NOTE — ED Notes (Signed)
Pt reports L CP starting today around 1200.

## 2013-10-17 NOTE — Discharge Instructions (Signed)
Take potassium daily as prescribed. Follow up with both cardiology and gastroenterology. Take tylenol every 6 hours as needed for chest wall pain. Return with worsening symptoms.  Costochondritis Costochondritis, sometimes called Tietze syndrome, is a swelling and irritation (inflammation) of the tissue (cartilage) that connects your ribs with your breastbone (sternum). It causes pain in the chest and rib area. Costochondritis usually goes away on its own over time. It can take up to 6 weeks or longer to get better, especially if you are unable to limit your activities. CAUSES  Some cases of costochondritis have no known cause. Possible causes include:  Injury (trauma).  Exercise or activity such as lifting.  Severe coughing. SIGNS AND SYMPTOMS  Pain and tenderness in the chest and rib area.  Pain that gets worse when coughing or taking deep breaths.  Pain that gets worse with specific movements. DIAGNOSIS  Your health care provider will do a physical exam and ask about your symptoms. Chest X-rays or other tests may be done to rule out other problems. TREATMENT  Costochondritis usually goes away on its own over time. Your health care provider may prescribe medicine to help relieve pain. HOME CARE INSTRUCTIONS   Avoid exhausting physical activity. Try not to strain your ribs during normal activity. This would include any activities using chest, abdominal, and side muscles, especially if heavy weights are used.  Apply ice to the affected area for the first 2 days after the pain begins.  Put ice in a plastic bag.  Place a towel between your skin and the bag.  Leave the ice on for 20 minutes, 2 3 times a day.  Only take over-the-counter or prescription medicines as directed by your health care provider. SEEK MEDICAL CARE IF:  You have redness or swelling at the rib joints. These are signs of infection.  Your pain does not go away despite rest or medicine. SEEK IMMEDIATE MEDICAL  CARE IF:   Your pain increases or you are very uncomfortable.  You have shortness of breath or difficulty breathing.  You cough up blood.  You have worse chest pains, sweating, or vomiting.  You have a fever or persistent symptoms for more than 2 3 days.  You have a fever and your symptoms suddenly get worse. MAKE SURE YOU:   Understand these instructions.  Will watch your condition.  Will get help right away if you are not doing well or get worse. Document Released: 04/22/2005 Document Revised: 05/03/2013 Document Reviewed: 02/14/2013 [facility] Patient Information 2014 [place], [place].  Chest Wall Pain Chest wall pain is pain in or around the bones and muscles of your chest. It may take up to 6 weeks to get better. It may take longer if you must stay physically active in your work and activities.  CAUSES  Chest wall pain may happen on its own. However, it may be caused by:  A viral illness like the flu.  Injury.  Coughing.  Exercise.  Arthritis.  Fibromyalgia.  Shingles. HOME CARE INSTRUCTIONS   Avoid overtiring physical activity. Try not to strain or perform activities that cause pain. This includes any activities using your chest or your abdominal and side muscles, especially if heavy weights are used.  Put ice on the sore area.  Put ice in a plastic bag.  Place a towel between your skin and the bag.  Leave the ice on for 15-20 minutes per hour while awake for the first 2 days.  Only take over-the-counter or prescription medicines for pain,  discomfort, or fever as directed by your caregiver. SEEK IMMEDIATE MEDICAL CARE IF:   Your pain increases, or you are very uncomfortable.  You have a fever.  Your chest pain becomes worse.  You have new, unexplained symptoms.  You have nausea or vomiting.  You feel sweaty or lightheaded.  You have a cough with phlegm (sputum), or you cough up blood. MAKE SURE YOU:   Understand these instructions.  Will  watch your condition.  Will get help right away if you are not doing well or get worse. Document Released: 07/13/2005 Document Revised: 10/05/2011 Document Reviewed: 03/09/2011 [facility] Patient Information 2014 [place].  Fecal Occult Blood Test This is a test done on a stool specimen to screen for gastrointestinal bleeding, which may be an indicator of colon cancer Is is usually done as part of a routine examination, annually, after age 38 or as directed by your caregiver. The fecal occult blood test (FOBT) checks for blood in your stool. Normally, there will not be enough blood lost through the gastrointestinal tract to turn an FOBT positive or for you to notice it visually in the form of bloody or dark, tarry stools. Any significant amount of blood being passed should be investigated.  A positive FOBT will tell your caregiver that you have bleeding occurring somewhere in your gastrointestinal tract. This blood loss could be due to ulcers, diverticulosis, bleeding polyps, inflammatory bowel disease, hemorrhoids, from swallowed blood due to bleeding gums or nosebleeds, or it could be due to benign or cancerous tumors. Anything that protrudes into the lumen (the empty space in the intestine), like a polyp or tumor, and is rubbed against by the fecal waste as it passes through has the potential to eventually bleed intermittently. Often this small amount of blood is the first, and sometimes the only, symptom of early colon cancer, making the FOBT a valuable screening tool. PREPARATION FOR TEST  You should not eat red meat within three days before testing. Other substances that could cause a false positive test result include fish, turnips, horseradish, and drugs such as colchicines and oxidizing drugs (for example, iodine and boric acid). Be sure to carefully follow your caregiver's instructions. With FOBT, your caregiver or laboratory will give you one or more test "cards." You collect a separate  sample from three different stools, usually on consecutive days. Each stool sample should be collected into a clean container and should not be contaminated with urine or water. The slide is labeled with your name and the date; then, with an applicator stick, you apply a thin smear of stool onto each filter paper square/window contained on the card. Allow the filter paper to dry. Once it is dry, it is stable. Usually you will collect all of the consecutive samples, and then return all of them to your caregiver or laboratory at the same time, sometimes by mailing them. There are also over the counter tests which are dropped in your toilet. NORMAL FINDINGS   No occult blood within the stool.  The FOBT test is normally negative. A positive indicates either blood in the stool or an interfering substance. Multiple samples are done to: 1) catch intermittent bleeding; and 2) help rule out false positives. Ranges for normal findings may vary among different laboratories and hospitals. You should always check with your doctor after having lab work or other tests done to discuss the meaning of your test results and whether your values are considered within normal limits. MEANING OF TEST  Your  caregiver will go over the test results with you and discuss the importance and meaning of your results, as well as treatment options and the need for additional tests if necessary. OBTAINING THE TEST RESULTS  It is your responsibility to obtain your test results. Ask the lab or department performing the test when and how you will get your results. Document Released: 08/07/2004 Document Revised: 10/05/2011 Document Reviewed: 06/22/2008 [facility] Patient Information 2014 [place], [place].  Anemia, Nonspecific Anemia is a condition in which the concentration of red blood cells or hemoglobin in the blood is below normal. Hemoglobin is a substance in red blood cells that carries oxygen to the tissues of the body. Anemia results  in not enough oxygen reaching these tissues.  CAUSES  Common causes of anemia include:   Excessive bleeding. Bleeding may be internal or external. This includes excessive bleeding from periods (in women) or from the intestine.   Poor nutrition.   Chronic kidney, thyroid, and liver disease.  Bone marrow disorders that decrease red blood cell production.  Cancer and treatments for cancer.  HIV, AIDS, and their treatments.  Spleen problems that increase red blood cell destruction.  Blood disorders.  Excess destruction of red blood cells due to infection, medicines, and autoimmune disorders. SIGNS AND SYMPTOMS   Minor weakness.   Dizziness.   Headache.  Palpitations.   Shortness of breath, especially with exercise.   Paleness.  Cold sensitivity.  Indigestion.  Nausea.  Difficulty sleeping.  Difficulty concentrating. Symptoms may occur suddenly or they may develop slowly.  DIAGNOSIS  Additional blood tests are often needed. These help your health care provider determine the best treatment. Your health care provider will check your stool for blood and look for other causes of blood loss.  TREATMENT  Treatment varies depending on the cause of the anemia. Treatment can include:   Supplements of iron, vitamin B12, or folic acid.   Hormone medicines.   A blood transfusion. This may be needed if blood loss is severe.   Hospitalization. This may be needed if there is significant continual blood loss.   Dietary changes.  Spleen removal. HOME CARE INSTRUCTIONS Keep all follow-up appointments. It often takes many weeks to correct anemia, and having your health care provider check on your condition and your response to treatment is very important. SEEK IMMEDIATE MEDICAL CARE IF:   You develop extreme weakness, shortness of breath, or chest pain.   You become dizzy or have trouble concentrating.  You develop heavy vaginal bleeding.   You develop a  rash.   You have bloody or black, tarry stools.   You faint.   You vomit up blood.   You vomit repeatedly.   You have abdominal pain.  You have a fever or persistent symptoms for more than 2 3 days.   You have a fever and your symptoms suddenly get worse.   You are dehydrated.  MAKE SURE YOU:  Understand these instructions.  Will watch your condition.  Will get help right away if you are not doing well or get worse. Document Released: 08/20/2004 Document Revised: 03/15/2013 Document Reviewed: 01/06/2013 [facility] Patient Information 2014 [place], [place].

## 2013-10-18 NOTE — ED Provider Notes (Signed)
Medical screening examination/treatment/procedure(s) were performed by non-physician practitioner and as supervising physician I was immediately available for consultation/collaboration.   EKG Interpretation   Date/Time:  Tuesday October 17 2013 14:57:13 EDT Ventricular Rate:  92 PR Interval:  150 QRS Duration: 82 QT Interval:  360 QTC Calculation: 445 R Axis:   40 Text Interpretation:  Normal sinus rhythm T wave abnormality, consider  inferior ischemia Abnormal ECG New T wave inversions Confirmed by  Deretha EmoryZACKOWSKI  MD, Annette Liotta [phone]([id number]) on 10/17/2013 5:41:12 PM        Shelda JakesScott W. Tamlyn Sides, MD 10/18/13 1348

## 2013-11-22 ENCOUNTER — Encounter (HOSPITAL_COMMUNITY): Payer: Self-pay | Admitting: Emergency Medicine

## 2013-11-22 DIAGNOSIS — K219 Gastro-esophageal reflux disease without esophagitis: Secondary | ICD-10-CM | POA: Insufficient documentation

## 2013-11-22 DIAGNOSIS — Z7982 Long term (current) use of aspirin: Secondary | ICD-10-CM | POA: Insufficient documentation

## 2013-11-22 DIAGNOSIS — A64 Unspecified sexually transmitted disease: Secondary | ICD-10-CM | POA: Insufficient documentation

## 2013-11-22 DIAGNOSIS — Z91040 Latex allergy status: Secondary | ICD-10-CM | POA: Insufficient documentation

## 2013-11-22 DIAGNOSIS — I10 Essential (primary) hypertension: Secondary | ICD-10-CM | POA: Insufficient documentation

## 2013-11-22 DIAGNOSIS — Z791 Long term (current) use of non-steroidal anti-inflammatories (NSAID): Secondary | ICD-10-CM | POA: Insufficient documentation

## 2013-11-22 DIAGNOSIS — F17200 Nicotine dependence, unspecified, uncomplicated: Secondary | ICD-10-CM | POA: Insufficient documentation

## 2013-11-22 DIAGNOSIS — Z79899 Other long term (current) drug therapy: Secondary | ICD-10-CM | POA: Insufficient documentation

## 2013-11-22 DIAGNOSIS — A5901 Trichomonal vulvovaginitis: Secondary | ICD-10-CM | POA: Insufficient documentation

## 2013-11-22 LAB — CBC WITH DIFFERENTIAL/PLATELET
Basophils Absolute: 0 10*3/uL (ref 0.0–0.1)
Basophils Relative: 0 % (ref 0–1)
Eosinophils Absolute: 0.1 10*3/uL (ref 0.0–0.7)
Eosinophils Relative: 1 % (ref 0–5)
HCT: 33.2 % — ABNORMAL LOW (ref 36.0–46.0)
Hemoglobin: 11 g/dL — ABNORMAL LOW (ref 12.0–15.0)
LYMPHS ABS: 3.2 10*3/uL (ref 0.7–4.0)
Lymphocytes Relative: 37 % (ref 12–46)
MCH: 34.2 pg — ABNORMAL HIGH (ref 26.0–34.0)
MCHC: 33.1 g/dL (ref 30.0–36.0)
MCV: 103.1 fL — AB (ref 78.0–100.0)
MONOS PCT: 5 % (ref 3–12)
Monocytes Absolute: 0.4 10*3/uL (ref 0.1–1.0)
Neutro Abs: 4.8 10*3/uL (ref 1.7–7.7)
Neutrophils Relative %: 57 % (ref 43–77)
Platelets: 349 10*3/uL (ref 150–400)
RBC: 3.22 MIL/uL — AB (ref 3.87–5.11)
RDW: 13.3 % (ref 11.5–15.5)
WBC: 8.5 10*3/uL (ref 4.0–10.5)

## 2013-11-22 LAB — URINALYSIS, ROUTINE W REFLEX MICROSCOPIC
Glucose, UA: NEGATIVE mg/dL
Ketones, ur: NEGATIVE mg/dL
NITRITE: NEGATIVE
PH: 5.5 (ref 5.0–8.0)
Protein, ur: 30 mg/dL — AB
Specific Gravity, Urine: 1.03 (ref 1.005–1.030)
Urobilinogen, UA: 0.2 mg/dL (ref 0.0–1.0)

## 2013-11-22 LAB — COMPREHENSIVE METABOLIC PANEL
ALT: 8 U/L (ref 0–35)
AST: 12 U/L (ref 0–37)
Albumin: 3.6 g/dL (ref 3.5–5.2)
Alkaline Phosphatase: 93 U/L (ref 39–117)
BUN: 8 mg/dL (ref 6–23)
CO2: 24 mEq/L (ref 19–32)
CREATININE: 0.79 mg/dL (ref 0.50–1.10)
Calcium: 9.1 mg/dL (ref 8.4–10.5)
Chloride: 105 mEq/L (ref 96–112)
GFR calc Af Amer: >90 mL/min (ref >90)
GFR calc non Af Amer: >90 mL/min (ref >90)
GLUCOSE: 86 mg/dL (ref 70–99)
Potassium: 2.8 mEq/L — CL (ref 3.7–5.3)
Sodium: 142 mEq/L (ref 137–147)
TOTAL PROTEIN: 7.5 g/dL (ref 6.0–8.3)
Total Bilirubin: 0.4 mg/dL (ref 0.3–1.2)

## 2013-11-22 LAB — URINE MICROSCOPIC-ADD ON

## 2013-11-22 NOTE — ED Notes (Addendum)
Pt reports blood in urine; left sided flank pain; pelvic pain for 3 days. Denies burning with urination. Denies N,V, mild diarr and constipation at times. Pt reports she was here recently and was blood found in stool. Was told to see gastroenterologist but was unable to afford it.

## 2013-11-22 NOTE — ED Notes (Signed)
Dr Karma GanjaLinker notified of Potassium  Ordered EKG

## 2013-11-22 NOTE — ED Notes (Signed)
Potassium 2.8 per lab

## 2013-11-23 ENCOUNTER — Emergency Department (HOSPITAL_COMMUNITY)
Admission: EM | Admit: 2013-11-23 | Discharge: 2013-11-23 | Disposition: A | Discharge status: Home / Self Care | Payer: BC Managed Care – PPO | Attending: Emergency Medicine | Admitting: Emergency Medicine

## 2013-11-23 ENCOUNTER — Encounter (HOSPITAL_COMMUNITY): Payer: Self-pay | Admitting: Emergency Medicine

## 2013-11-23 DIAGNOSIS — R109 Unspecified abdominal pain: Secondary | ICD-10-CM

## 2013-11-23 DIAGNOSIS — A64 Unspecified sexually transmitted disease: Secondary | ICD-10-CM

## 2013-11-23 DIAGNOSIS — A599 Trichomoniasis, unspecified: Secondary | ICD-10-CM

## 2013-11-23 LAB — GC/CHLAMYDIA PROBE AMP
CT Probe RNA: NEGATIVE
GC Probe RNA: NEGATIVE

## 2013-11-23 LAB — HIV ANTIBODY (ROUTINE TESTING W REFLEX): HIV 1&2 Ab, 4th Generation: NONREACTIVE

## 2013-11-23 LAB — RPR

## 2013-11-23 MED ORDER — CEFTRIAXONE SODIUM 250 MG IJ SOLR
250.0000 mg | Freq: Once | INTRAMUSCULAR | Status: AC
Start: 2013-11-23 — End: 2013-11-23
  Administered 2013-11-23: 250 mg via INTRAMUSCULAR
  Filled 2013-11-23: qty 250

## 2013-11-23 MED ORDER — LIDOCAINE HCL (PF) 1 % IJ SOLN
INTRAMUSCULAR | Status: AC
  Administered 2013-11-23: 0.9 mL
  Filled 2013-11-23: qty 5

## 2013-11-23 MED ORDER — POTASSIUM CHLORIDE ER 10 MEQ PO TBCR: 10.0000 meq | EXTENDED_RELEASE_TABLET | Freq: Every day | ORAL | Status: DC

## 2013-11-23 MED ORDER — DOXYCYCLINE HYCLATE 100 MG PO CAPS: 100.0000 mg | ORAL_CAPSULE | Freq: Two times a day (BID) | ORAL | Status: DC

## 2013-11-23 MED ORDER — METRONIDAZOLE 500 MG PO TABS: 500.0000 mg | ORAL_TABLET | Freq: Two times a day (BID) | ORAL | Status: DC

## 2013-11-23 MED ORDER — NAPROXEN 500 MG PO TABS: 500.0000 mg | ORAL_TABLET | Freq: Two times a day (BID) | ORAL | Status: DC

## 2013-11-23 NOTE — ED Provider Notes (Signed)
CSN: [id number]     Arrival date & time 11/22/13  2009 History   First MD Initiated Contact with Patient 11/23/13 0121     Chief Complaint  Patient presents with  . Abdominal Pain  . Flank Pain     (Consider location/radiation/quality/duration/timing/severity/associated sxs/prior Treatment) HPI Comments: 38-year-old female, history of abdominal pain which has been intermittent for the last several days. This seems to be intermittent, it is not spurred on by any specific action including eating, drinking, urinating or having bowel movements. She has noted intermittent vaginal discharge as well as some dark colored urine. The symptoms are not present at this time, she has no symptoms on arrival here at the emergency department. She does have a history of gonorrhea in the past. The pain is located in the left abdomen and sometimes has some radiation to the left flank.  Patient is a 38 y.o. female presenting with abdominal pain and flank pain. The history is provided by the patient.  Abdominal Pain Flank Pain Associated symptoms include abdominal pain.    Past Medical History  Diagnosis Date  . Hypertension   . GERD (gastroesophageal reflux disease)    History reviewed. No pertinent past surgical history. Family History  Problem Relation Age of Onset  . Diabetes Mother   . Hypertension Mother   . GER disease Mother    History  Substance Use Topics  . Smoking status: Current Every Day Smoker -- 0.30 packs/day for 15 years    Types: Cigarettes  . Smokeless tobacco: Never Used  . Alcohol Use: Yes     Comment: rare   OB History   Grav Para Term Preterm Abortions TAB SAB Ect Mult Living                 Review of Systems  Gastrointestinal: Positive for abdominal pain.  Genitourinary: Positive for flank pain.  All other systems reviewed and are negative.     Allergies  Coconut flavor; Latex; Pineapple; and Tomato  Home Medications   Prior to Admission medications    Medication Sig Start Date End Date Taking? Authorizing Provider  amLODipine (NORVASC) 10 MG tablet Take 10 mg by mouth daily.   Yes Historical Provider, MD  aspirin EC 81 MG tablet Take 81 mg by mouth daily.   Yes Historical Provider, MD  hydrochlorothiazide (HYDRODIURIL) 25 MG tablet Take 25 mg by mouth daily.   Yes Historical Provider, MD  ibuprofen (ADVIL,MOTRIN) 800 MG tablet Take 800 mg by mouth every 8 (eight) hours as needed for mild pain.   Yes Historical Provider, MD  omeprazole (PRILOSEC) 20 MG capsule Take 20 mg by mouth daily.   Yes Historical Provider, MD  potassium chloride SA (K-DUR,KLOR-CON) 20 MEQ tablet Take 1 tablet (20 mEq total) by mouth daily. 10/17/13  Yes Trevor Mace, PA-C  omeprazole (PRILOSEC) 20 MG capsule Take 1 capsule (20 mg total) by mouth daily. 11/11/11 11/10/12  Ward Givens, MD   BP 141/84  Pulse 78  Temp(Src) 98.7 F (37.1 C) (Oral)  Resp 18  Ht 5\' 5"  (1.651 m)  Wt 202 lb (91.627 kg)  BMI 33.61 kg/m2  SpO2 100% Physical Exam  Nursing note and vitals reviewed. Constitutional: She appears well-developed and well-nourished. No distress.  HENT:  Head: Normocephalic and atraumatic.  Mouth/Throat: Oropharynx is clear and moist. No oropharyngeal exudate.  Eyes: Conjunctivae and EOM are normal. Pupils are equal, round, and reactive to light. Right eye exhibits no discharge. Left eye exhibits no  discharge. No scleral icterus.  Neck: Normal range of motion. Neck supple. No JVD present. No thyromegaly present.  Cardiovascular: Normal rate, regular rhythm, normal heart sounds and intact distal pulses.  Exam reveals no gallop and no friction rub.   No murmur heard. Pulmonary/Chest: Effort normal and breath sounds normal. No respiratory distress. She has no wheezes. She has no rales.  Abdominal: Soft. Bowel sounds are normal. She exhibits no distension and no mass. There is no tenderness.  Genitourinary:  Chaperone present for exam, grayish white discharge in the  vaginal vault, no cervical motion tenderness or adnexal tenderness or masses, no bleeding, cervical os closed  Musculoskeletal: Normal range of motion. She exhibits no edema and no tenderness.  Lymphadenopathy:    She has no cervical adenopathy.  Neurological: She is alert. Coordination normal.  Skin: Skin is warm and dry. No rash noted. No erythema.  Psychiatric: She has a normal mood and affect. Her behavior is normal.    ED Course  Procedures (including critical care time) Labs Review Labs Reviewed  CBC WITH DIFFERENTIAL - Abnormal; Notable for the following:    RBC 3.22 (*)    Hemoglobin 11.0 (*)    HCT 33.2 (*)    MCV 103.1 (*)    MCH 34.2 (*)    All other components within normal limits  COMPREHENSIVE METABOLIC PANEL - Abnormal; Notable for the following:    Potassium 2.8 (*)    All other components within normal limits  URINALYSIS, ROUTINE W REFLEX MICROSCOPIC - Abnormal; Notable for the following:    Color, Urine AMBER (*)    APPearance CLOUDY (*)    Hgb urine dipstick MODERATE (*)    Bilirubin Urine SMALL (*)    Protein, ur 30 (*)    Leukocytes, UA MODERATE (*)    All other components within normal limits  URINE MICROSCOPIC-ADD ON - Abnormal; Notable for the following:    Squamous Epithelial / LPF MANY (*)    All other components within normal limits  GC/CHLAMYDIA PROBE AMP  RPR  HIV ANTIBODY (ROUTINE TESTING)    Imaging Review No results found.   EKG Interpretation   Date/Time:  Wednesday November 22 2013 21:45:17 EDT Ventricular Rate:  75 PR Interval:  148 QRS Duration: 78 QT Interval:  386 QTC Calculation: 431 R Axis:   25 Text Interpretation:  Normal sinus rhythm Normal ECG possible U waves  Since last tracing U waves now present Confirmed by Darryl Blumenstein  MD, Laurell Coalson  [phone]([id number]) on 11/23/2013 1:21:15 AM      MDM   Final diagnoses:  STD (female)  Trichomonal infection  Abdominal pain    The patient has no abdominal tenderness, her vital signs are  normal, her exam is consistent with an STD and her urinalysis shows that she does have Trichomonas. She will be prophylactically treated for STDs, she has been made aware of the indications for return and followup.  Meds given in ED:  Medications  cefTRIAXone (ROCEPHIN) injection 250 mg (not administered)    New Prescriptions   DOXYCYCLINE (VIBRAMYCIN) 100 MG CAPSULE    Take 1 capsule (100 mg total) by mouth 2 (two) times daily.   METRONIDAZOLE (FLAGYL) 500 MG TABLET    Take 1 tablet (500 mg total) by mouth 2 (two) times daily.   NAPROXEN (NAPROSYN) 500 MG TABLET    Take 1 tablet (500 mg total) by mouth 2 (two) times daily with a meal.        Vida RollerBrian D Margarite Vessel,  MD 11/23/13 [id number]

## 2013-11-23 NOTE — Discharge Instructions (Signed)
Pelvic Infection ° °If you have been diagnosed with a pelvic infection such as a sexually transmitted disease, you will need to be treated with antibiotics. Please take the medicines as prescribed. Some of these tests do not come back for 1-2 days in which case if they turn positive you will receive a phone call to let you know. If you are contacted and do have an infection consistent with a sexually transmitted disease, then you will need to tell any and all sexual partners that you have had in the last 6 months no so that they can be tested and treated as well. If you should develop severe or worsening pain in your abdomen or the pelvis or develop severe fevers,nausea or vomiting that prevent you from taking your medications, return to the emergency department immediately. Otherwise contact your local physician or county health department for a follow up appointment to complete STD testing including HIV and syphilis.  See the list of phone numbers below. ° °RESOURCE GUIDE ° °Dental Problems ° °Patients with Medicaid: °[facility]                     [facility] °[address]                                           [address] °Phone:  [phone]                                                  Phone:  [phone] ° °If unable to pay or uninsured, contact:  [facility] or [facility] to become qualified for the adult dental clinic. ° °Chronic Pain Problems °Contact [facility]  [phone] °Patients need to be referred by their primary care doctor. ° °Insufficient Money for Medicine °Contact [employer]:  call "211" or [facility] 271-5999. ° °No Primary Care Doctor °Call [facility]  832-8000 °Other agencies that provide inexpensive medical care °   [facility]  [phone] °   [facility]  832-7272 °   [facility]  [phone] °   [facility] Clinic  832-4777 °   [facility]  [phone] ° [facility]  [phone] ° °Psychological Services °[facility]  [phone] °[facility]  [phone] °[facility]   [phone] (emergency services 641-4993) ° °Substance Abuse Resources °[facility]  336-882-2125 °[facility] 336-784-9470 °[facility] 336-285-9073 °[facility] 336-845-3988 °Residential & Outpatient Substance Abuse Program  800-659-3381 ° °Abuse/Neglect °[place] [facility] (336) 641-3795 °[place] [facility] 800-378-5315 (After Hours) ° °Emergency Shelter °[facility] (336) 271-5985 ° °Maternity Homes °[facility] (336) 275-9566 °[facility] (704) 372-4663 ° °MRSA Hotline #:   832-7006 ° ° ° °[place] [facility] ° °[facility] [place] [facility]     [employer]                          [facility] °[address] [place]                         [address]      [address]  °[place]                                                [place]                            [place] °Phone:  349-3220                                   Phone:  [phone]                 Phone:  342-8140 ° °[facility] °Phone:  342-8316 ° °[place] [facility] °(336) 342-1394 °(336) 342-3537 (After Hours) ° ° ° °

## 2013-11-23 NOTE — ED Notes (Signed)
Clarified with Dr. Hyacinth MeekerMiller about missing order for wet prep.  MD says to discard since diagnosis has already been made.  Contacted lab to update.

## 2014-09-27 ENCOUNTER — Emergency Department (HOSPITAL_COMMUNITY): Payer: BLUE CROSS/BLUE SHIELD

## 2014-09-27 ENCOUNTER — Encounter (HOSPITAL_COMMUNITY): Payer: Self-pay | Admitting: *Deleted

## 2014-09-27 ENCOUNTER — Emergency Department (HOSPITAL_COMMUNITY)
Admission: EM | Admit: 2014-09-27 | Discharge: 2014-09-27 | Disposition: A | Discharge status: Home / Self Care | Payer: BLUE CROSS/BLUE SHIELD | Attending: Emergency Medicine | Admitting: Emergency Medicine

## 2014-09-27 DIAGNOSIS — R51 Headache: Secondary | ICD-10-CM | POA: Diagnosis not present

## 2014-09-27 DIAGNOSIS — R1084 Generalized abdominal pain: Secondary | ICD-10-CM | POA: Diagnosis not present

## 2014-09-27 DIAGNOSIS — Z792 Long term (current) use of antibiotics: Secondary | ICD-10-CM | POA: Insufficient documentation

## 2014-09-27 DIAGNOSIS — I10 Essential (primary) hypertension: Secondary | ICD-10-CM | POA: Diagnosis not present

## 2014-09-27 DIAGNOSIS — R079 Chest pain, unspecified: Secondary | ICD-10-CM | POA: Diagnosis not present

## 2014-09-27 DIAGNOSIS — Z79899 Other long term (current) drug therapy: Secondary | ICD-10-CM | POA: Diagnosis not present

## 2014-09-27 DIAGNOSIS — K219 Gastro-esophageal reflux disease without esophagitis: Secondary | ICD-10-CM | POA: Diagnosis not present

## 2014-09-27 DIAGNOSIS — Z791 Long term (current) use of non-steroidal anti-inflammatories (NSAID): Secondary | ICD-10-CM | POA: Diagnosis not present

## 2014-09-27 DIAGNOSIS — Z7982 Long term (current) use of aspirin: Secondary | ICD-10-CM | POA: Diagnosis not present

## 2014-09-27 DIAGNOSIS — Z720 Tobacco use: Secondary | ICD-10-CM | POA: Insufficient documentation

## 2014-09-27 DIAGNOSIS — Z3202 Encounter for pregnancy test, result negative: Secondary | ICD-10-CM | POA: Insufficient documentation

## 2014-09-27 DIAGNOSIS — R109 Unspecified abdominal pain: Secondary | ICD-10-CM | POA: Diagnosis present

## 2014-09-27 DIAGNOSIS — Z91040 Latex allergy status: Secondary | ICD-10-CM | POA: Diagnosis not present

## 2014-09-27 DIAGNOSIS — R519 Headache, unspecified: Secondary | ICD-10-CM

## 2014-09-27 LAB — CBC WITH DIFFERENTIAL/PLATELET
BASOS PCT: 0 % (ref 0–1)
Basophils Absolute: 0 10*3/uL (ref 0.0–0.1)
Eosinophils Absolute: 0.1 10*3/uL (ref 0.0–0.7)
Eosinophils Relative: 2 % (ref 0–5)
HCT: 35.8 % — ABNORMAL LOW (ref 36.0–46.0)
Hemoglobin: 11.7 g/dL — ABNORMAL LOW (ref 12.0–15.0)
LYMPHS ABS: 2.3 10*3/uL (ref 0.7–4.0)
Lymphocytes Relative: 38 % (ref 12–46)
MCH: 33.9 pg (ref 26.0–34.0)
MCHC: 32.7 g/dL (ref 30.0–36.0)
MCV: 103.8 fL — ABNORMAL HIGH (ref 78.0–100.0)
MONOS PCT: 6 % (ref 3–12)
Monocytes Absolute: 0.4 10*3/uL (ref 0.1–1.0)
NEUTROS PCT: 54 % (ref 43–77)
Neutro Abs: 3.2 10*3/uL (ref 1.7–7.7)
PLATELETS: 257 10*3/uL (ref 150–400)
RBC: 3.45 MIL/uL — AB (ref 3.87–5.11)
RDW: 12.6 % (ref 11.5–15.5)
WBC: 6 10*3/uL (ref 4.0–10.5)

## 2014-09-27 LAB — URINALYSIS, ROUTINE W REFLEX MICROSCOPIC
BILIRUBIN URINE: NEGATIVE
GLUCOSE, UA: NEGATIVE mg/dL
KETONES UR: NEGATIVE mg/dL
LEUKOCYTES UA: NEGATIVE
Nitrite: NEGATIVE
Protein, ur: NEGATIVE mg/dL
SPECIFIC GRAVITY, URINE: 1.025 (ref 1.005–1.030)
Urobilinogen, UA: 0.2 mg/dL (ref 0.0–1.0)
pH: 6.5 (ref 5.0–8.0)

## 2014-09-27 LAB — COMPREHENSIVE METABOLIC PANEL
ALT: 18 U/L (ref 0–35)
AST: 27 U/L (ref 0–37)
Albumin: 4.1 g/dL (ref 3.5–5.2)
Alkaline Phosphatase: 68 U/L (ref 39–117)
Anion gap: 5 (ref 5–15)
BILIRUBIN TOTAL: 0.5 mg/dL (ref 0.3–1.2)
BUN: 9 mg/dL (ref 6–23)
CHLORIDE: 106 mmol/L (ref 96–112)
CO2: 29 mmol/L (ref 19–32)
CREATININE: 0.78 mg/dL (ref 0.50–1.10)
Calcium: 9.1 mg/dL (ref 8.4–10.5)
GFR calc Af Amer: >90 mL/min (ref >90)
GFR calc non Af Amer: >90 mL/min (ref >90)
Glucose, Bld: 92 mg/dL (ref 70–99)
Potassium: 3.3 mmol/L — ABNORMAL LOW (ref 3.5–5.1)
Sodium: 140 mmol/L (ref 135–145)
Total Protein: 7.5 g/dL (ref 6.0–8.3)

## 2014-09-27 LAB — LIPASE, BLOOD: Lipase: 27 U/L (ref 11–59)

## 2014-09-27 LAB — URINE MICROSCOPIC-ADD ON

## 2014-09-27 LAB — POC URINE PREG, ED: PREG TEST UR: NEGATIVE

## 2014-09-27 MED ORDER — POTASSIUM CHLORIDE CRYS ER 20 MEQ PO TBCR
40.0000 meq | EXTENDED_RELEASE_TABLET | Freq: Once | ORAL | Status: AC
  Administered 2014-09-27: 40 meq via ORAL
  Filled 2014-09-27: qty 2

## 2014-09-27 MED ORDER — SODIUM CHLORIDE 0.9 % IV BOLUS (SEPSIS)
1000.0000 mL | Freq: Once | INTRAVENOUS | Status: AC
  Administered 2014-09-27: 1000 mL via INTRAVENOUS

## 2014-09-27 MED ORDER — KETOROLAC TROMETHAMINE 30 MG/ML IJ SOLN
30.0000 mg | Freq: Once | INTRAMUSCULAR | Status: AC
  Administered 2014-09-27: 30 mg via INTRAVENOUS
  Filled 2014-09-27: qty 1

## 2014-09-27 MED ORDER — NAPROXEN 500 MG PO TABS: 500.0000 mg | ORAL_TABLET | Freq: Two times a day (BID) | ORAL | Status: DC

## 2014-09-27 NOTE — ED Notes (Signed)
Xray informed of negative pregnancy.

## 2014-09-27 NOTE — ED Notes (Signed)
POC pregnancy negative as reported by Elease HashimotoPatricia in mini lab.

## 2014-09-27 NOTE — ED Provider Notes (Signed)
CSN: [id number]     Arrival date & time 09/27/14  [id number] History   First MD Initiated Contact with Patient 09/27/14 1013     Chief Complaint  Patient presents with  . Chest Pain  . Headache  . Abdominal Pain     (Consider location/radiation/quality/duration/timing/severity/associated sxs/prior Treatment) HPI   39 year old female with history of hypertension and GERD presents with multiple complaints. Patient reports for the past 2 days she has had intermittent chest pain. She described pain as a sharp sensation to the left side of chest radiates up to her left side of neck and across both upper arms. Chest pain is waxing waning, does not associated with exertion however certain movement does aggravate her pain. No associated lightheadedness or dizziness or shortness of breath. Denies diaphoresis. She also endorsed abdominal pain for the past 2 days. Pain is a crampy sensation with sharp component starting on the right side of her abdomen and radiate across the left side of abdomen worsening with bending over or with lifting. No associated nausea vomiting or diarrhea, no dysuria or hematuria vaginal bleeding vaginal discharge hematochezia or melena. Today she also endorsed a gradual onset of headache. Headache resides on the right forehead, persistent with light and sound sensitivity but no visual changes and no neck stiffness. No fever or chills. States that she has history of migraine headaches since she was young. Patient works as a Estate manager/land agentjanitorial. She admits to having a strong family history of cardiac disease, she is a smoker and has history of hypertension. She also mentioned that she has low potassium which she was given supplementation. She denies any prior history of PE or DVT, no recent surgery, prolonged bed rest, unilateral leg swelling or calf pain, taking birth control pill, hemoptysis, or active cancer.   Past Medical History  Diagnosis Date  . Hypertension   . GERD (gastroesophageal reflux  disease)    History reviewed. No pertinent past surgical history. Family History  Problem Relation Age of Onset  . Diabetes Mother   . Hypertension Mother   . GER disease Mother    History  Substance Use Topics  . Smoking status: Current Every Day Smoker -- 0.30 packs/day for 15 years    Types: Cigarettes  . Smokeless tobacco: Never Used  . Alcohol Use: Yes     Comment: rare   OB History    No data available     Review of Systems  All other systems reviewed and are negative.     Allergies  Coconut flavor; Latex; Pineapple; and Tomato  Home Medications   Prior to Admission medications   Medication Sig Start Date End Date Taking? Authorizing Provider  amLODipine (NORVASC) 10 MG tablet Take 10 mg by mouth daily.    Historical Provider, MD  aspirin EC 81 MG tablet Take 81 mg by mouth daily.    Historical Provider, MD  doxycycline (VIBRAMYCIN) 100 MG capsule Take 1 capsule (100 mg total) by mouth 2 (two) times daily. 11/23/13   Vida RollerBrian D Miller, MD  hydrochlorothiazide (HYDRODIURIL) 25 MG tablet Take 25 mg by mouth daily.    Historical Provider, MD  ibuprofen (ADVIL,MOTRIN) 800 MG tablet Take 800 mg by mouth every 8 (eight) hours as needed for mild pain.    Historical Provider, MD  metroNIDAZOLE (FLAGYL) 500 MG tablet Take 1 tablet (500 mg total) by mouth 2 (two) times daily. 11/23/13   Vida RollerBrian D Miller, MD  naproxen (NAPROSYN) 500 MG tablet Take 1 tablet (500 mg total)  by mouth 2 (two) times daily with a meal. 11/23/13   Vida Roller, MD  omeprazole (PRILOSEC) 20 MG capsule Take 1 capsule (20 mg total) by mouth daily. 11/11/11 11/10/12  Ward Givens, MD  omeprazole (PRILOSEC) 20 MG capsule Take 20 mg by mouth daily.    Historical Provider, MD  potassium chloride (K-DUR) 10 MEQ tablet Take 1 tablet (10 mEq total) by mouth daily. 11/23/13   Vida Roller, MD  potassium chloride SA (K-DUR,KLOR-CON) 20 MEQ tablet Take 1 tablet (20 mEq total) by mouth daily. 10/17/13   Robyn M Hess, PA-C    BP 128/79 mmHg  Pulse 73  Temp(Src) 98.1 F (36.7 C) (Oral)  Resp 15  SpO2 96% Physical Exam  Constitutional: She is oriented to person, place, and time. She appears well-developed and well-nourished. No distress.  HENT:  Head: Atraumatic.  Eyes: Conjunctivae and EOM are normal. Pupils are equal, round, and reactive to light.  Neck: Neck supple. No JVD present.  No nuchal rigidity  Cardiovascular: Normal rate and regular rhythm.  Exam reveals no gallop and no friction rub.   No murmur heard. Pulmonary/Chest: Effort normal and breath sounds normal. No respiratory distress. She exhibits tenderness (Mild diffuse anterior chest wall tenderness to palpation without focal point tenderness, crepitus, as emphysema.).  Abdominal: Soft. Bowel sounds are normal. She exhibits no distension. There is no tenderness.  Musculoskeletal: She exhibits no edema.  Bilateral lower extremities without palpable cords, erythema, edema.  5/5 strength to all 4 extremities with intact distal pulses.  Neurological: She is alert and oriented to person, place, and time. She has normal strength. No cranial nerve deficit or sensory deficit. GCS eye subscore is 4. GCS verbal subscore is 5. GCS motor subscore is 6.  Skin: No rash noted.  Psychiatric: She has a normal mood and affect.  Nursing note and vitals reviewed.   ED Course  Procedures (including critical care time)   10:51 AM Patient presents with multiple complaints. Her headache is chronic in nature, no acute onset thunderclap headache concerning for subarachnoid hemorrhage, no fever nuchal rigidity concerning for meningitis, no focal neuro deficit concerning for stroke. Patient endorse chest pain.  HEART score of 3, low risk.  Pain is reproducible.  Endorse abdominal pain.  Her abdomen is soft and nontender.  No peritoneal sign.  Work up initiated.  No abdominal bruit, low suspicion for AAA or aortic dissection.  12:29 PM Workup unremarkable, labs  reassuring, mild hypokalemia with a potassium of 3.3, supplementation given. No source of infection identified. Doubt ACS. Pt report feeling better.  She mentioned she has not f/u with her PCP and are out of all of her meds for nearly a year.  She request medication refill.  She agrees to set up f/u appointment with pcp promptly.    12:36 PM Patient has amlodipine, and hydrochlorothiazide listed on the list of medication. Her blood pressure has been normal throughout today's visit. At this time I recommend follow-up with her PCP for medication refill and to see whether she needs to be on blood per medication. Patient voiced understanding and agrees with plan. Will prescribe NSAIDs to use as needed.  Labs Review Labs Reviewed  CBC WITH DIFFERENTIAL/PLATELET - Abnormal; Notable for the following:    RBC 3.45 (*)    Hemoglobin 11.7 (*)    HCT 35.8 (*)    MCV 103.8 (*)    All other components within normal limits  COMPREHENSIVE METABOLIC PANEL - Abnormal; Notable  for the following:    Potassium 3.3 (*)    All other components within normal limits  URINALYSIS, ROUTINE W REFLEX MICROSCOPIC - Abnormal; Notable for the following:    APPearance TURBID (*)    Hgb urine dipstick SMALL (*)    All other components within normal limits  URINE MICROSCOPIC-ADD ON - Abnormal; Notable for the following:    Bacteria, UA FEW (*)    All other components within normal limits  LIPASE, BLOOD  I-STAT TROPOININ, ED  POC URINE PREG, ED    Imaging Review Dg Chest 2 View  09/27/2014   CLINICAL DATA:  Chest pain and difficulty breathing for 2 days  EXAM: CHEST  2 VIEW  COMPARISON:  October 17, 2013  FINDINGS: Lungs are clear. Heart size and pulmonary vascularity are normal. No adenopathy. No bone lesions. No pneumothorax.  IMPRESSION: No edema or consolidation.   Electronically Signed   By: Bretta Bang III M.D.   On: 09/27/2014 11:50     EKG Interpretation   Date/Time:  Thursday September 27 2014 10:24:01  EST Ventricular Rate:  70 PR Interval:  139 QRS Duration: 81 QT Interval:  429 QTC Calculation: 463 R Axis:   43 Text Interpretation:  Sinus rhythm No significant change since last  tracing Confirmed by KNAPP  MD-J, JON ([id number]) on 09/27/2014 10:31:33 AM      MDM   Final diagnoses:  Chest pain  Bad headache  Generalized abdominal pain    BP 127/86 mmHg  Pulse 82  Temp(Src) 98.1 F (36.7 C) (Oral)  Resp 27  SpO2 100%  I have reviewed nursing notes and vital signs. I personally reviewed the imaging tests through PACS system  I reviewed available ER/hospitalization records thought the EMR     Fayrene Helper, PA-C 09/27/14 1243  Linwood Dibbles, MD 09/27/14 1246

## 2014-09-27 NOTE — Discharge Instructions (Signed)
Please follow up with your doctor for further management of your health.  You may benefit from outpatient cardiac stress test if your doctor think it is appropriate.  Take naproxen as needed for pain.     Emergency Department Resource Guide 1) Find a Doctor and Pay Out of Pocket Although you won't have to find out who is covered by your insurance plan, it is a good idea to ask around and get recommendations. You will then need to call the office and see if the doctor you have chosen will accept you as a new patient and what types of options they offer for patients who are self-pay. Some doctors offer discounts or will set up payment plans for their patients who do not have insurance, but you will need to ask so you aren't surprised when you get to your appointment.  2) Contact Your Local Health Department Not all health departments have doctors that can see patients for sick visits, but many do, so it is worth a call to see if yours does. If you don't know where your local health department is, you can check in your phone book. The CDC also has a tool to help you locate your state's health department, and many state websites also have listings of all of their local health departments.  3) Find a Walk-in Clinic If your illness is not likely to be very severe or complicated, you may want to try a walk in clinic. These are popping up all over the country in pharmacies, drugstores, and shopping centers. They're usually staffed by nurse practitioners or physician assistants that have been trained to treat common illnesses and complaints. They're usually fairly quick and inexpensive. However, if you have serious medical issues or chronic medical problems, these are probably not your best option.  No Primary Care Doctor: - Call Health Connect at  [phone] - they can help you locate a primary care doctor that  accepts your insurance, provides certain services, etc. - Physician Referral Service-  [phone]  Chronic Pain Problems: Organization         Address  Phone   Notes  [facility]  [phone] Patients need to be referred by their primary care doctor.   Medication Assistance: Organization         Address  Phone   Notes  [facility] Medication [facility] [address] [place], [place] [address] [phone] --Must be a resident of [place] -- Must have NO insurance coverage whatsoever (no Medicaid/ Medicare, etc.) -- The pt. MUST have a primary care doctor that directs their care regularly and follows them in the community   MedAssist  [phone]   [employer]  [phone]    Agencies that provide inexpensive medical care: Organization         Address  Phone   Notes  [facility]  [phone]   [facility]    [phone]   [facility] [address] [place], [place] [address] [phone]   [facility] [address], [place] [phone]   [facility]anned Parenthood    [phone]   [facility]    [phone]   [facility]  [address], [place] Phone:  [phone], Fax:  [phone] Hours of Operation:  9 am - 6 pm, M-F.  Also accepts Medicaid/Medicare and self-pay.  [facility]  [place] for [place], [address], [place] Phone: [phone], Fax: [phone]. Hours of Operation:  8:30 am - 5:30 pm, M-F.  Also accepts Medicaid and self-pay.  [facility] [place] [address] [place] Phone: [phone]   [place], [place], [place] [phone], Ext. [phone] Mondays & Thursdays: 7-9 AM.  First 15 patients are seen on a first come, first serve basis.    [place] Providers:  Organization         Address  Phone   Notes  [facility] [address],  [place] [phone] Also accepts self-pay patients.  [facility] [id number] [place], [place]  [phone]   [place], [address], [place] [phone]   [facility] [address] [place] [phone]   Lucianne Lei [address], [place]   [phone] Only accepts [place] Access [place] patients after they have their name applied to their card.   Self-Pay (no insurance) in [facility]:  Organization         Address  Phone   Notes  Sickle Cell Patients, [facility] [place] [phone]   [facility] [place] [phone]   [facility] [place]  [place], [place], [place] [phone]   [facility]/Dr. Osei-Bonsu  [address], [place] or [address], [place] [phone] Phone number for both [place] and [place] locations is the same.  [facility] [address] [place] [phone]   [facility]  [address], [place] or [address] [place] [address] [phone]   [facility] [address], [place] [phone], phone; [phone], fax Sees patients 1st and 3rd Saturday of every month.  Must not qualify for public or private insurance (i.e. Medicaid, Medicare, [place] Health Choice, Veterans' Benefits)  Household income should be no more than 200% of the poverty level The clinic cannot treat you if you are pregnant or think you are pregnant  Sexually transmitted diseases are not treated at the clinic.    Dental Care: Organization         Address  Phone  Notes  [facility] Department of [facility] [place] [phone] Accepts children up to age 40 who are enrolled in  [place] or [place]; pregnant women with a Medicaid card; and children who have applied for Medicaid or [place] Health Choice, but were declined, whose parents can pay a reduced fee at time of service.  [facility] of [facility]  [address], [place] [phone] Accepts children up to age 85 who are enrolled in [place] or [place]; pregnant women with a Medicaid card; and children who have applied for Medicaid or [place] Health Choice, but were declined, whose parents can pay a reduced fee at time of service.  [place] Adult [facility] Access PROGRAM  [place] [phone] Patients are seen by appointment only. Walk-ins are not accepted. [place] will see patients 62 years of age and older. Monday - Tuesday (8am-5pm) Most Wednesdays (8:30-5pm) $30 per visit,  cash only  [employer] Adult [employer] PROGRAM  [address], [place] [phone] Patients are seen by appointment only. Walk-ins are not accepted. [place] will see patients 68 years of age and older. One Wednesday Evening (Monthly: Volunteer Based).  $30 per visit, cash only  [place]  [phone] for adults; Children under age 32, call Graduate Pediatric Dentistry at [phone]. Children aged 36-14, please call [phone] to request a pediatric application.  Dental services are provided in all areas of dental care including fillings, crowns and bridges, complete and partial dentures, implants, gum treatment, root canals, and extractions. Preventive care is also provided. Treatment is provided to both adults and children. Patients are selected via a lottery and there is often a waiting list.   [facility] [address] [place]  [phone] [email or web address]   [facility] [address] [place], [place] [phone], Ext. [phone] Second and Fourth Thursday of each month, opens at 6:30  AM; Clinic ends at 9 AM.  Patients are seen on a first-come first-served basis, and a limited number are seen during each clinic.   [facility]  [address] Hillard Danker [place], [place] [phone]   Eligibility Requirements You must have lived in [place], [place], or [place] counties for at least the last three months.   You cannot be eligible for state or federal sponsored [employer], including [employer], [place], or [employer].   You generally cannot be eligible for healthcare insurance through your employer.    How to apply: Eligibility screenings are held every Tuesday and Wednesday afternoon from 1:00 pm until 4:00 pm. You do not need an appointment for the interview!  [facility] [address] [place], [place]   [place]  [place] Department  [place]  [phone]    Behavioral Health Resources in the Community: Intensive Outpatient Programs Organization         Address  Phone  Notes  Bejou Flanagan. [address], [place], [place] [phone]   [facility] Outpatient [address] [place], [place]   ADS: Alcohol & Drug Svcs [address] [place], [place]   [place] [address],  [place], [place] or [phone]   Substance Abuse Resources Organization         Address  Phone  Notes  Alcohol and Drug Services  [phone]   [place]  [phone]   [facility] [place]   Chinita Pester  [phone]   Residential & Outpatient Substance Abuse Program  [phone]   Psychological Services Organization         Address  Phone  Notes  [facility] [place]  [place]  [phone]   [place] [address] [place] or  [phone]    Mobile Crisis Teams Organization         Address  Phone  Notes  [facility], Mobile Crisis Care Unit  [phone]   [facility]  [address] [place], [place]   [place] Levels [address], [place] [phone]    Self-Help/Support Groups Organization         Address  Phone  Notes  [facility] of [place] - variety of support groups  [place] Call for more information  Narcotics Anonymous (NA), [facility] [address], [employer] [place]  2 meetings at this location   Special educational needs teacher         Address  Phone  Notes  [facility] Residential Treatment [place],    [place]  [phone]   [facility]  [address] [place] [phone], [place], [place]   [place], [place] [phone] Admissions: 8am-3pm M-F  Incentives Substance [place] [address]    [place], [place] [phone]   [facility] [address] [place], [place], [place]   [facility] [address],  [place], [place]   [facility] - Intensive Outpatient [address], [place], [place]   [facility] [address]  [place], [place] [phone] or [phone]   [facility] (RTS) [address] [place], [place] Accepts Medicaid  [facility] [place] [address],  [place] [phone] Substance Abuse/Addiction Treatment   [facility] Organization         Address  Phone  Notes  [facility]  [phone]   Domenic Schwab, PhD [address] Arlis Porta [place], [place]   [phone] or [phone]   [place] and [place], [place] [phone]   [facility] [address],  [place], [place] [phone] Insurance/Medicaid/sponsorship through [facility] and Families [address], [address] [place]                                    [place], [place] [phone] [place] [address] [place], [place] [phone]    Dr. Adele Schilder  [phone]   [facility] Dept. 1) [address], [place] 2) [place] 3)  [place] [address], [place] [phone]  [phone]   [place] [phone] or [phone] (After Hours)

## 2014-09-27 NOTE — ED Notes (Signed)
Pt arrives from home, states she has had chest pain, h/a, and abdominal pain x2 days intermittently. Pt states chest pain is intermittent and radiates down the left arm and up to the jaw and will also radiate to the right. Pt denies n/v/d, dizziness, lightheadedness, sob, weakness.

## 2014-09-27 NOTE — ED Notes (Signed)
Pt is in stable condition upon d/c and ambulates from ED. 

## 2015-09-09 ENCOUNTER — Encounter (HOSPITAL_COMMUNITY): Payer: Self-pay

## 2015-09-09 ENCOUNTER — Inpatient Hospital Stay (HOSPITAL_COMMUNITY)
Admission: AD | Admit: 2015-09-09 | Discharge: 2015-09-09 | Disposition: A | Discharge status: Home / Self Care | Payer: BLUE CROSS/BLUE SHIELD | Source: Ambulatory Visit | Attending: Family Medicine | Admitting: Family Medicine

## 2015-09-09 ENCOUNTER — Inpatient Hospital Stay (HOSPITAL_COMMUNITY): Payer: BLUE CROSS/BLUE SHIELD

## 2015-09-09 DIAGNOSIS — O3680X0 Pregnancy with inconclusive fetal viability, not applicable or unspecified: Secondary | ICD-10-CM

## 2015-09-09 DIAGNOSIS — O99331 Smoking (tobacco) complicating pregnancy, first trimester: Secondary | ICD-10-CM | POA: Diagnosis not present

## 2015-09-09 DIAGNOSIS — Z7982 Long term (current) use of aspirin: Secondary | ICD-10-CM | POA: Insufficient documentation

## 2015-09-09 DIAGNOSIS — F17210 Nicotine dependence, cigarettes, uncomplicated: Secondary | ICD-10-CM | POA: Insufficient documentation

## 2015-09-09 DIAGNOSIS — Z3A01 Less than 8 weeks gestation of pregnancy: Secondary | ICD-10-CM | POA: Diagnosis not present

## 2015-09-09 DIAGNOSIS — O4691 Antepartum hemorrhage, unspecified, first trimester: Secondary | ICD-10-CM | POA: Diagnosis not present

## 2015-09-09 DIAGNOSIS — O209 Hemorrhage in early pregnancy, unspecified: Secondary | ICD-10-CM | POA: Insufficient documentation

## 2015-09-09 LAB — HCG, QUANTITATIVE, PREGNANCY: hCG, Beta Chain, Quant, S: 395 m[IU]/mL — ABNORMAL HIGH (ref <5)

## 2015-09-09 LAB — URINE MICROSCOPIC-ADD ON

## 2015-09-09 LAB — URINALYSIS, ROUTINE W REFLEX MICROSCOPIC
Bilirubin Urine: NEGATIVE
GLUCOSE, UA: NEGATIVE mg/dL
KETONES UR: NEGATIVE mg/dL
Leukocytes, UA: NEGATIVE
NITRITE: NEGATIVE
PH: 6 (ref 5.0–8.0)
PROTEIN: NEGATIVE mg/dL
Specific Gravity, Urine: 1.02 (ref 1.005–1.030)

## 2015-09-09 LAB — WET PREP, GENITAL
Sperm: NONE SEEN
Trich, Wet Prep: NONE SEEN
YEAST WET PREP: NONE SEEN

## 2015-09-09 LAB — CBC
HCT: 34.4 % — ABNORMAL LOW (ref 36.0–46.0)
HEMOGLOBIN: 11.6 g/dL — AB (ref 12.0–15.0)
MCH: 34.2 pg — AB (ref 26.0–34.0)
MCHC: 33.7 g/dL (ref 30.0–36.0)
MCV: 101.5 fL — AB (ref 78.0–100.0)
PLATELETS: 256 10*3/uL (ref 150–400)
RBC: 3.39 MIL/uL — ABNORMAL LOW (ref 3.87–5.11)
RDW: 12.8 % (ref 11.5–15.5)
WBC: 7.3 10*3/uL (ref 4.0–10.5)

## 2015-09-09 LAB — POCT PREGNANCY, URINE: Preg Test, Ur: POSITIVE — AB

## 2015-09-09 NOTE — Discharge Instructions (Signed)

## 2015-09-09 NOTE — MAU Note (Signed)
Patient presents via EMS with vaginal bleeding that started 2 hrs ago. Patient denies any pain.

## 2015-09-09 NOTE — MAU Provider Note (Signed)
History     CSN: [id number]  Arrival date and time: 09/09/15 2047   First Provider Initiated Contact with Patient 09/09/15 2146      Chief Complaint  Patient presents with  . Vaginal Bleeding   Vaginal Bleeding The patient's primary symptoms include vaginal bleeding. This is a new problem. The current episode started today. The problem occurs intermittently. The problem has been unchanged. The patient is experiencing no pain. She is pregnant. Pertinent negatives include no abdominal pain, chills, constipation, diarrhea, dysuria, fever, frequency, nausea, urgency or vomiting. The vaginal discharge was bloody. The vaginal bleeding is spotting. She has not been passing clots. She has not been passing tissue. Nothing aggravates the symptoms. She has tried nothing for the symptoms. She is sexually active. It is unknown whether or not her partner has an STD. She uses nothing for contraception. Her menstrual history has been irregular (LMP 08/01/15 ).    Past Medical History  Diagnosis Date  . Hypertension   . GERD (gastroesophageal reflux disease)     History reviewed. No pertinent past surgical history.  Family History  Problem Relation Age of Onset  . Diabetes Mother   . Hypertension Mother   . GER disease Mother     Social History  Substance Use Topics  . Smoking status: Current Every Day Smoker -- 0.30 packs/day for 15 years    Types: Cigarettes  . Smokeless tobacco: Never Used  . Alcohol Use: Yes     Comment: rare    Allergies:  Allergies  Allergen Reactions  . Coconut Flavor     Nausea   . Latex Itching  . Pineapple     Itching and tongue swelling  . Tomato     Itching and tongue swelling.     Prescriptions prior to admission  Medication Sig Dispense Refill Last Dose  . amLODipine (NORVASC) 10 MG tablet Take 10 mg by mouth daily.   unk  . aspirin EC 81 MG tablet Take 81 mg by mouth daily.   09/26/2014 at Unknown time  . doxycycline (VIBRAMYCIN) 100 MG capsule  Take 1 capsule (100 mg total) by mouth 2 (two) times daily. (Patient not taking: Reported on 09/27/2014) 20 capsule 0 Completed Course at Unknown time  . hydrochlorothiazide (HYDRODIURIL) 25 MG tablet Take 25 mg by mouth daily.   unk  . ibuprofen (ADVIL,MOTRIN) 800 MG tablet Take 800 mg by mouth every 8 (eight) hours as needed for mild pain.   unk  . metroNIDAZOLE (FLAGYL) 500 MG tablet Take 1 tablet (500 mg total) by mouth 2 (two) times daily. 20 tablet 0   . naproxen (NAPROSYN) 500 MG tablet Take 1 tablet (500 mg total) by mouth 2 (two) times daily with a meal. 30 tablet 0   . omeprazole (PRILOSEC) 20 MG capsule Take 1 capsule (20 mg total) by mouth daily. 30 capsule 0 Past Month at Unknown time  . potassium chloride (K-DUR) 10 MEQ tablet Take 1 tablet (10 mEq total) by mouth daily. 20 tablet 0   . potassium chloride SA (K-DUR,KLOR-CON) 20 MEQ tablet Take 1 tablet (20 mEq total) by mouth daily. 5 tablet 0 Past Week at Unknown time    Review of Systems  Constitutional: Negative for fever and chills.  Gastrointestinal: Negative for nausea, vomiting, abdominal pain, diarrhea and constipation.  Genitourinary: Positive for vaginal bleeding. Negative for dysuria, urgency and frequency.   Physical Exam   Blood pressure 139/79, pulse 78, temperature 98.8 F (37.1 C), temperature source Oral,  resp. rate 16, last menstrual period 08/01/2015.  Physical Exam  Nursing note and vitals reviewed. Constitutional: She is oriented to person, place, and time. She appears well-developed and well-nourished. No distress.  HENT:  Head: Normocephalic.  Cardiovascular: Normal rate.   Respiratory: Effort normal.  GI: Soft. There is no tenderness. There is no rebound.  Neurological: She is alert and oriented to person, place, and time.  Skin: Skin is warm and dry.  Psychiatric: She has a normal mood and affect.   Results for orders placed or performed during the hospital encounter of 09/09/15 (from the past 24  hour(s))  Urinalysis, Routine w reflex microscopic (not at [place])     Status: Abnormal   Collection Time: 09/09/15  8:54 PM  Result Value Ref Range   Color, Urine YELLOW YELLOW   APPearance CLEAR CLEAR   Specific Gravity, Urine 1.020 1.005 - 1.030   pH 6.0 5.0 - 8.0   Glucose, UA NEGATIVE NEGATIVE mg/dL   Hgb urine dipstick LARGE (A) NEGATIVE   Bilirubin Urine NEGATIVE NEGATIVE   Ketones, ur NEGATIVE NEGATIVE mg/dL   Protein, ur NEGATIVE NEGATIVE mg/dL   Nitrite NEGATIVE NEGATIVE   Leukocytes, UA NEGATIVE NEGATIVE  Urine microscopic-add on     Status: Abnormal   Collection Time: 09/09/15  8:54 PM  Result Value Ref Range   Squamous Epithelial / LPF 6-30 (A) NONE SEEN   WBC, UA 0-5 0 - 5 WBC/hpf   RBC / HPF 0-5 0 - 5 RBC/hpf   Bacteria, UA FEW (A) NONE SEEN   Urine-Other AMORPHOUS URATES/PHOSPHATES   Pregnancy, urine POC     Status: Abnormal   Collection Time: 09/09/15  9:00 PM  Result Value Ref Range   Preg Test, Ur POSITIVE (A) NEGATIVE  Wet prep, genital     Status: Abnormal   Collection Time: 09/09/15  9:27 PM  Result Value Ref Range   Yeast Wet Prep HPF POC NONE SEEN NONE SEEN   Trich, Wet Prep NONE SEEN NONE SEEN   Clue Cells Wet Prep HPF POC PRESENT (A) NONE SEEN   WBC, Wet Prep HPF POC FEW (A) NONE SEEN   Sperm NONE SEEN   CBC     Status: Abnormal   Collection Time: 09/09/15  9:50 PM  Result Value Ref Range   WBC 7.3 4.0 - 10.5 K/uL   RBC 3.39 (L) 3.87 - 5.11 MIL/uL   Hemoglobin 11.6 (L) 12.0 - 15.0 g/dL   HCT 16.1 (L) 09.6 - 04.5 %   MCV 101.5 (H) 78.0 - 100.0 fL   MCH 34.2 (H) 26.0 - 34.0 pg   MCHC 33.7 30.0 - 36.0 g/dL   RDW 40.9 81.1 - 91.4 %   Platelets 256 150 - 400 K/uL  hCG, quantitative, pregnancy     Status: Abnormal   Collection Time: 09/09/15  9:50 PM  Result Value Ref Range   hCG, Beta Chain, Quant, S 395 (H) <5 mIU/mL  ABO/Rh     Status: None (Preliminary result)   Collection Time: 09/09/15 10:06 PM  Result Value Ref Range   ABO/RH(D) B POS     US Ob Comp Less 14 Wks  09/09/2015  CLINICAL DATA:  Spotting today. Estimated gestational age by LMP is 5 weeks 4 days. Quantitative beta HCG is 395. EXAM: OBSTETRIC <14 WK [place] AND TRANSVAGINAL OB US TECHNIQUE: Both transabdominal and transvaginal ultrasound examinations were performed for complete evaluation of the gestation as well as the maternal uterus, adnexal regions, and  pelvic cul-de-sac. Transvaginal technique was performed to assess early pregnancy. COMPARISON:  None. FINDINGS: Intrauterine gestational sac: No intrauterine gestational sac identified. Yolk sac:  Not identified. Embryo:  Not identified. Cardiac Activity: Not identified. Maternal uterus/adnexae: Uterus is anteverted. No myometrial mass lesions identified. Endometrial stripe is not thickened. Both ovaries are visualized and appear normal. Corpus luteal cyst on the right. No free fluid in the pelvis. IMPRESSION: No intrauterine gestational sac, yolk sac, or fetal pole identified. Differential considerations include intrauterine pregnancy too early to be sonographically visualized, missed abortion, or ectopic pregnancy. Followup ultrasound is recommended in 10-14 days for further evaluation. Electronically Signed   By: Burman Nieves M.D.   On: 09/09/2015 23:24   US Ob Transvaginal  09/09/2015  CLINICAL DATA:  Spotting today. Estimated gestational age by LMP is 5 weeks 4 days. Quantitative beta HCG is 395. EXAM: OBSTETRIC <14 WK [place] AND TRANSVAGINAL OB US TECHNIQUE: Both transabdominal and transvaginal ultrasound examinations were performed for complete evaluation of the gestation as well as the maternal uterus, adnexal regions, and pelvic cul-de-sac. Transvaginal technique was performed to assess early pregnancy. COMPARISON:  None. FINDINGS: Intrauterine gestational sac: No intrauterine gestational sac identified. Yolk sac:  Not identified. Embryo:  Not identified. Cardiac Activity: Not identified. Maternal uterus/adnexae: Uterus is  anteverted. No myometrial mass lesions identified. Endometrial stripe is not thickened. Both ovaries are visualized and appear normal. Corpus luteal cyst on the right. No free fluid in the pelvis. IMPRESSION: No intrauterine gestational sac, yolk sac, or fetal pole identified. Differential considerations include intrauterine pregnancy too early to be sonographically visualized, missed abortion, or ectopic pregnancy. Followup ultrasound is recommended in 10-14 days for further evaluation. Electronically Signed   By: Burman Nieves M.D.   On: 09/09/2015 23:24     MAU Course  Procedures  MDM   Assessment and Plan   1. Vaginal bleeding in pregnancy, first trimester   2. Pregnancy, location unknown    DC home Comfort measures reviewed  1st Trimester precautions  Bleeding precautions Ectopic precautions RX: none  Return to MAU as needed   Follow-up Information    Follow up with [facility].   Specialty:  Obstetrics and Gynecology   Why:  thursday 09/11/14 at 11:00 am for repeat blood work    Contact information:   [address] [place] [address] [phone]        Tawnya Crook 09/09/2015, 9:52 PM

## 2015-09-10 ENCOUNTER — Encounter (HOSPITAL_COMMUNITY): Payer: Self-pay | Admitting: *Deleted

## 2015-09-10 ENCOUNTER — Inpatient Hospital Stay (HOSPITAL_COMMUNITY)
Admission: AD | Admit: 2015-09-10 | Discharge: 2015-09-10 | Disposition: A | Discharge status: Home / Self Care | Payer: BLUE CROSS/BLUE SHIELD | Source: Ambulatory Visit | Attending: Obstetrics & Gynecology | Admitting: Obstetrics & Gynecology

## 2015-09-10 DIAGNOSIS — Z3A01 Less than 8 weeks gestation of pregnancy: Secondary | ICD-10-CM | POA: Insufficient documentation

## 2015-09-10 DIAGNOSIS — O99331 Smoking (tobacco) complicating pregnancy, first trimester: Secondary | ICD-10-CM | POA: Insufficient documentation

## 2015-09-10 DIAGNOSIS — O209 Hemorrhage in early pregnancy, unspecified: Secondary | ICD-10-CM | POA: Insufficient documentation

## 2015-09-10 DIAGNOSIS — O4691 Antepartum hemorrhage, unspecified, first trimester: Secondary | ICD-10-CM

## 2015-09-10 DIAGNOSIS — O3680X0 Pregnancy with inconclusive fetal viability, not applicable or unspecified: Secondary | ICD-10-CM

## 2015-09-10 DIAGNOSIS — F17210 Nicotine dependence, cigarettes, uncomplicated: Secondary | ICD-10-CM | POA: Insufficient documentation

## 2015-09-10 DIAGNOSIS — Z7982 Long term (current) use of aspirin: Secondary | ICD-10-CM | POA: Insufficient documentation

## 2015-09-10 LAB — CBC
HEMATOCRIT: 37 % (ref 36.0–46.0)
HEMOGLOBIN: 12.4 g/dL (ref 12.0–15.0)
MCH: 33 pg (ref 26.0–34.0)
MCHC: 33.5 g/dL (ref 30.0–36.0)
MCV: 98.4 fL (ref 78.0–100.0)
Platelets: 294 10*3/uL (ref 150–400)
RBC: 3.76 MIL/uL — ABNORMAL LOW (ref 3.87–5.11)
RDW: 12.6 % (ref 11.5–15.5)
WBC: 7.7 10*3/uL (ref 4.0–10.5)

## 2015-09-10 LAB — GC/CHLAMYDIA PROBE AMP (~~LOC~~) NOT AT ARMC
Chlamydia: NEGATIVE
NEISSERIA GONORRHEA: NEGATIVE

## 2015-09-10 LAB — ABO/RH: ABO/RH(D): B POS

## 2015-09-10 LAB — RPR: RPR Ser Ql: NONREACTIVE

## 2015-09-10 LAB — HCG, QUANTITATIVE, PREGNANCY: hCG, Beta Chain, Quant, S: 475 m[IU]/mL — ABNORMAL HIGH (ref <5)

## 2015-09-10 LAB — HIV ANTIBODY (ROUTINE TESTING W REFLEX): HIV Screen 4th Generation wRfx: NONREACTIVE

## 2015-09-10 NOTE — MAU Provider Note (Signed)
Chief Complaint: Vaginal Bleeding   None     SUBJECTIVE HPI: Suzanne [name] is a 40 y.o. G1P0 at [redacted]w[redacted]d by LMP who presents to maternity admissions reporting increased bleeding since her visit in MAU yesterday. She was seen yesterday with vaginal spotting and had quant hcg of 395 and no IUP or ectopic visualized on [place].  Today, she reports her bleeding increased and is dark red and requiring pads, soaking a pad in 2-3 hours with some golf ball sized clots.  She has not tried anything for her bleeding, nothing makes it better or worse. She denies abdominal pain, vaginal itching/burning, urinary symptoms, h/a, dizziness, n/v, or fever/chills.     HPI  Past Medical History  Diagnosis Date  . Hypertension   . GERD (gastroesophageal reflux disease)    Past Surgical History  Procedure Laterality Date  . Colposcopy    . Cervical biopsy     Social History   Social History  . Marital Status: Single    Spouse Name: N/A  . Number of Children: N/A  . Years of Education: N/A   Occupational History  . Not on file.   Social History Main Topics  . Smoking status: Current Every Day Smoker -- 0.30 packs/day for 15 years    Types: Cigarettes  . Smokeless tobacco: Never Used  . Alcohol Use: Yes     Comment: rare  . Drug Use: 1.00 per week    Special: Marijuana  . Sexual Activity: Yes    Birth Control/ Protection: Injection   Other Topics Concern  . Not on file   Social History Narrative   No current facility-administered medications on file prior to encounter.   Current Outpatient Prescriptions on File Prior to Encounter  Medication Sig Dispense Refill  . amLODipine (NORVASC) 10 MG tablet Take 10 mg by mouth daily.    Marland Kitchen aspirin EC 81 MG tablet Take 81 mg by mouth daily.    . hydrochlorothiazide (HYDRODIURIL) 25 MG tablet Take 25 mg by mouth daily.    Marland Kitchen lisinopril (PRINIVIL,ZESTRIL) 10 MG tablet Take 10 mg by mouth daily.    . Multiple Vitamin (MULTIVITAMIN WITH MINERALS) TABS  tablet Take 1 tablet by mouth daily.    Marland Kitchen omeprazole (PRILOSEC) 20 MG capsule Take 20 mg by mouth daily.     Allergies  Allergen Reactions  . Coconut Flavor Nausea Only        . Latex Itching  . Pineapple Itching and Swelling    Tongue swelling  . Tomato Itching and Swelling    Tongue swelling.     ROS:  Review of Systems  Constitutional: Negative for fever, chills and fatigue.  Respiratory: Negative for shortness of breath.   Cardiovascular: Negative for chest pain.  Gastrointestinal: Negative for abdominal pain.  Genitourinary: Positive for vaginal bleeding. Negative for dysuria, flank pain, vaginal discharge, difficulty urinating, vaginal pain and pelvic pain.  Neurological: Negative for dizziness and headaches.  Psychiatric/Behavioral: Negative.      I have reviewed patient's Past Medical Hx, Surgical Hx, Family Hx, Social Hx, medications and allergies.   Physical Exam   Patient Vitals for the past 24 hrs:  BP Temp Temp src Pulse Resp Height Weight  09/10/15 1741 138/84 mmHg - - 100 16 - -  09/10/15 1512 129/87 mmHg 98.6 F (37 C) Oral (!) 123 18  (1.676 m) 192 lb (87.091 kg)   Constitutional: Well-developed, well-nourished female in no acute distress.  Cardiovascular: normal rate Respiratory: normal effort GI:  Abd soft, non-tender. Pos BS x 4 MS: Extremities nontender, no edema, normal ROM Neurologic: Alert and oriented x 4.  GU: Neg CVAT.  PELVIC EXAM: deferred.  Visual inspection of pad with 1/4 soaked pad in 2 hours in MAU.  LAB RESULTS Results for orders placed or performed during the hospital encounter of 09/10/15 (from the past 24 hour(s))  CBC     Status: Abnormal   Collection Time: 09/10/15  4:04 PM  Result Value Ref Range   WBC 7.7 4.0 - 10.5 K/uL   RBC 3.76 (L) 3.87 - 5.11 MIL/uL   Hemoglobin 12.4 12.0 - 15.0 g/dL   HCT 16.1 09.6 - 04.5 %   MCV 98.4 78.0 - 100.0 fL   MCH 33.0 26.0 - 34.0 pg   MCHC 33.5 30.0 - 36.0 g/dL   RDW 40.9 81.1 -  91.4 %   Platelets 294 150 - 400 K/uL  hCG, quantitative, pregnancy     Status: Abnormal   Collection Time: 09/10/15  4:04 PM  Result Value Ref Range   hCG, Beta Chain, Quant, S 475 (H) <5 mIU/mL    --/--/B POS (02/13 2206)  IMAGING US Ob Comp Less 14 Wks  09/09/2015  CLINICAL DATA:  Spotting today. Estimated gestational age by LMP is 5 weeks 4 days. Quantitative beta HCG is 395. EXAM: OBSTETRIC <14 WK [place] AND TRANSVAGINAL OB US TECHNIQUE: Both transabdominal and transvaginal ultrasound examinations were performed for complete evaluation of the gestation as well as the maternal uterus, adnexal regions, and pelvic cul-de-sac. Transvaginal technique was performed to assess early pregnancy. COMPARISON:  None. FINDINGS: Intrauterine gestational sac: No intrauterine gestational sac identified. Yolk sac:  Not identified. Embryo:  Not identified. Cardiac Activity: Not identified. Maternal uterus/adnexae: Uterus is anteverted. No myometrial mass lesions identified. Endometrial stripe is not thickened. Both ovaries are visualized and appear normal. Corpus luteal cyst on the right. No free fluid in the pelvis. IMPRESSION: No intrauterine gestational sac, yolk sac, or fetal pole identified. Differential considerations include intrauterine pregnancy too early to be sonographically visualized, missed abortion, or ectopic pregnancy. Followup ultrasound is recommended in 10-14 days for further evaluation. Electronically Signed   By: Burman Nieves M.D.   On: 09/09/2015 23:24   US Ob Transvaginal  09/09/2015  CLINICAL DATA:  Spotting today. Estimated gestational age by LMP is 5 weeks 4 days. Quantitative beta HCG is 395. EXAM: OBSTETRIC <14 WK [place] AND TRANSVAGINAL OB US TECHNIQUE: Both transabdominal and transvaginal ultrasound examinations were performed for complete evaluation of the gestation as well as the maternal uterus, adnexal regions, and pelvic cul-de-sac. Transvaginal technique was performed to assess  early pregnancy. COMPARISON:  None. FINDINGS: Intrauterine gestational sac: No intrauterine gestational sac identified. Yolk sac:  Not identified. Embryo:  Not identified. Cardiac Activity: Not identified. Maternal uterus/adnexae: Uterus is anteverted. No myometrial mass lesions identified. Endometrial stripe is not thickened. Both ovaries are visualized and appear normal. Corpus luteal cyst on the right. No free fluid in the pelvis. IMPRESSION: No intrauterine gestational sac, yolk sac, or fetal pole identified. Differential considerations include intrauterine pregnancy too early to be sonographically visualized, missed abortion, or ectopic pregnancy. Followup ultrasound is recommended in 10-14 days for further evaluation. Electronically Signed   By: Burman Nieves M.D.   On: 09/09/2015 23:24    MAU Management/MDM: Ordered labs and reviewed results.  With rise in hcg, and no report of pain today, will discharge pt with plan to follow hcg in 2 days in clinic.  Ectopic  precautions given, pt aware that ectopic can be life threatening and she needs to follow up as scheduled and return to MAU with emergencies. Pt stable at time of discharge.  ASSESSMENT 1. Vaginal bleeding in pregnancy, first trimester   2. Pregnancy of unknown anatomic location     PLAN Discharge home with ectopic and bleeding precautions   Follow-up Information    Follow up with [facility]   Specialty:  Obstetrics and Gynecology   Why:  On Thursday at 11:00 as scheduled , Return to MAU as needed for emergencies   Contact information:   [address] [place] [address] [phone]      Sharen Counter Certified Nurse-Midwife 09/10/2015  6:48 PM

## 2015-09-10 NOTE — Discharge Instructions (Signed)

## 2015-09-10 NOTE — MAU Note (Addendum)
Was here last night, was spotting.  Bleeding got heavier, passed a golf ball sized clot about 30 min ago.

## 2015-09-10 NOTE — MAU Note (Signed)
Denies pain, though has a funny feeling in lower abd

## 2015-09-12 ENCOUNTER — Encounter: Payer: Self-pay | Admitting: Advanced Practice Midwife

## 2015-09-12 ENCOUNTER — Ambulatory Visit (INDEPENDENT_AMBULATORY_CARE_PROVIDER_SITE_OTHER): Payer: BLUE CROSS/BLUE SHIELD | Admitting: Advanced Practice Midwife

## 2015-09-12 DIAGNOSIS — O3680X0 Pregnancy with inconclusive fetal viability, not applicable or unspecified: Secondary | ICD-10-CM

## 2015-09-12 DIAGNOSIS — O034 Incomplete spontaneous abortion without complication: Secondary | ICD-10-CM

## 2015-09-12 DIAGNOSIS — O0281 Inappropriate change in quantitative human chorionic gonadotropin (hCG) in early pregnancy: Secondary | ICD-10-CM | POA: Diagnosis not present

## 2015-09-12 LAB — HCG, QUANTITATIVE, PREGNANCY: HCG, BETA CHAIN, QUANT, S: 141 m[IU]/mL — AB (ref <5)

## 2015-09-12 NOTE — Patient Instructions (Signed)
Incomplete Miscarriage A miscarriage is the sudden loss of an unborn baby (fetus) before the 20th week of pregnancy. In an incomplete miscarriage, parts of the fetus or placenta (afterbirth) remain in the body.  Having a miscarriage can be an emotional experience. Talk with your health care provider about any questions you may have about miscarrying, the grieving process, and your future pregnancy plans. CAUSES   Problems with the fetal chromosomes that make it impossible for the baby to develop normally. Problems with the baby's genes or chromosomes are most often the result of errors that occur by chance as the embryo divides and grows. The problems are not inherited from the parents.  Infection of the cervix or uterus.  Hormone problems.  Problems with the cervix, such as having an incompetent cervix. This is when the tissue in the cervix is not strong enough to hold the pregnancy.  Problems with the uterus, such as an abnormally shaped uterus, uterine fibroids, or congenital abnormalities.  Certain medical conditions.  Smoking, drinking alcohol, or taking illegal drugs.  Trauma. SYMPTOMS   Vaginal bleeding or spotting, with or without cramps or pain.  Pain or cramping in the abdomen or lower back.  Passing fluid, tissue, or blood clots from the vagina. DIAGNOSIS  Your health care provider will perform a physical exam. You may also have an ultrasound to confirm the miscarriage. Blood or urine tests may also be ordered. TREATMENT   Usually, a dilation and curettage (D&C) procedure is performed. During a D&C procedure, the cervix is widened (dilated) and any remaining fetal or placental tissue is gently removed from the uterus.  Antibiotic medicines are prescribed if there is an infection. Other medicines may be given to reduce the size of the uterus (contract) if there is a lot of bleeding.  If you have Rh negative blood and your baby was Rh positive, you will need a Rho (D)  immune globulin shot. This shot will protect any future baby from having Rh blood problems in future pregnancies.  You may be confined to bed rest. This means you should stay in bed and only get up to use the bathroom. HOME CARE INSTRUCTIONS   Rest as directed by your health care provider.  Restrict activity as directed by your health care provider. You may be allowed to continue light activity if curettage was not done but you require further treatment.  Keep track of the number of pads you use each day. Keep track of how soaked (saturated) they are. Record this information.  Do not  use tampons.  Do not douche or have sexual intercourse until approved by your health care provider.  Keep all follow-up appointments for reevaluation and continuing management.  Only take over-the-counter or prescription medicines for pain, fever, or discomfort as directed by your health care provider.  Take antibiotic medicine as directed by your health care provider. Make sure you finish it even if you start to feel better. SEEK IMMEDIATE MEDICAL CARE IF:   You experience severe cramps in your stomach, back, or abdomen.  You have an unexplained temperature (make sure to record these temperatures).  You pass large clots or tissue (save these for your health care provider to inspect).  Your bleeding increases.  You become light-headed, weak, or have fainting episodes. MAKE SURE YOU:   Understand these instructions.  Will watch your condition.  Will get help right away if you are not doing well or get worse.   This information is not intended to   replace advice given to you by your health care provider. Make sure you discuss any questions you have with your health care provider.   Document Released: 07/13/2005 Document Revised: 08/03/2014 Document Reviewed: 02/09/2013 Elsevier Interactive Patient Education 2016 [employer]  

## 2015-09-12 NOTE — Progress Notes (Signed)
Ms. Suzanne [name]  is a 40 y.o. G1P0 at [redacted]w[redacted]d who presents to the [facility] today for follow-up quant hCG after 48 hours. The patient was seen in MAU on 09/09/15 and had quant hCG of 395 and US showed nothing in uterus. She denies pain, heavy vaginal bleeding or fever today. Her second HCG went up to 475, but her bleeding increased.   Pregnancy was desired, first pregnancy   OB History  Gravida Para Term Preterm AB SAB TAB Ectopic Multiple Living  1             # Outcome Date GA Lbr Len/2nd Weight Sex Delivery Anes PTL Lv  1 Current               Past Medical History  Diagnosis Date  . Hypertension   . GERD (gastroesophageal reflux disease)      LMP 08/01/2015  CONSTITUTIONAL: Well-developed, well-nourished female in no acute distress.  ENT: External right and left ear normal.  EYES: EOM intact, conjunctivae normal.  MUSCULOSKELETAL: Normal range of motion.  CARDIOVASCULAR: Regular heart rate RESPIRATORY: Normal effort NEUROLOGICAL: Alert and oriented to person, place, and time.  SKIN: Skin is warm and dry. No rash noted. Not diaphoretic. No erythema. No pallor. PSYCH: Normal mood and affect. Normal behavior. Normal judgment and thought content.  Results for orders placed or performed in visit on 09/12/15 (from the past 24 hour(s))  hCG, quantitative, pregnancy     Status: Abnormal   Collection Time: 09/12/15 11:12 AM  Result Value Ref Range   hCG, Beta Chain, Quant, S 141 (H) <5 mIU/mL    A: inappropriate  rise in quant hCG after 48 hours Significant drop Inevitable SAB  P: Discharge home First trimester/ectopic precautions discussed Patient will return for follow-up US in 1 week. Order placed and RN to schedule. Patient will return to [facility] for results following [place].  Patient may return to MAU as needed or if her condition were to change or worsen   Aviva Signs, CNM 09/12/2015 1:20 PM

## 2015-09-20 ENCOUNTER — Other Ambulatory Visit: Payer: BLUE CROSS/BLUE SHIELD

## 2015-09-20 DIAGNOSIS — O034 Incomplete spontaneous abortion without complication: Secondary | ICD-10-CM

## 2015-09-21 LAB — HCG, QUANTITATIVE, PREGNANCY: hCG, Beta Chain, Quant, S: <2 m[IU]/mL

## 2015-09-23 ENCOUNTER — Telehealth: Payer: Self-pay | Admitting: *Deleted

## 2015-09-23 NOTE — Telephone Encounter (Signed)
Called patient per Dr Debroah Loop to inform her of b-hcg results. No need for f/u. Patient voiced understanding. No further questions.

## 2015-09-23 NOTE — Telephone Encounter (Signed)
-----   Message from Adam Phenix, MD sent at 09/21/2015  3:59 PM EST ----- HCG low c/w complete SAb

## 2015-11-29 ENCOUNTER — Inpatient Hospital Stay (HOSPITAL_COMMUNITY)
Admission: AD | Admit: 2015-11-29 | Discharge: 2015-11-29 | Disposition: A | Discharge status: Home / Self Care | Payer: BLUE CROSS/BLUE SHIELD | Source: Ambulatory Visit | Attending: Obstetrics & Gynecology | Admitting: Obstetrics & Gynecology

## 2015-11-29 ENCOUNTER — Inpatient Hospital Stay (HOSPITAL_COMMUNITY): Payer: BLUE CROSS/BLUE SHIELD

## 2015-11-29 ENCOUNTER — Encounter (HOSPITAL_COMMUNITY): Payer: Self-pay | Admitting: *Deleted

## 2015-11-29 DIAGNOSIS — O418X10 Other specified disorders of amniotic fluid and membranes, first trimester, not applicable or unspecified: Secondary | ICD-10-CM

## 2015-11-29 DIAGNOSIS — Z79899 Other long term (current) drug therapy: Secondary | ICD-10-CM | POA: Diagnosis not present

## 2015-11-29 DIAGNOSIS — O99331 Smoking (tobacco) complicating pregnancy, first trimester: Secondary | ICD-10-CM | POA: Diagnosis not present

## 2015-11-29 DIAGNOSIS — O10919 Unspecified pre-existing hypertension complicating pregnancy, unspecified trimester: Secondary | ICD-10-CM

## 2015-11-29 DIAGNOSIS — F17210 Nicotine dependence, cigarettes, uncomplicated: Secondary | ICD-10-CM | POA: Insufficient documentation

## 2015-11-29 DIAGNOSIS — R109 Unspecified abdominal pain: Secondary | ICD-10-CM | POA: Diagnosis present

## 2015-11-29 DIAGNOSIS — O26891 Other specified pregnancy related conditions, first trimester: Secondary | ICD-10-CM | POA: Insufficient documentation

## 2015-11-29 DIAGNOSIS — Z3A01 Less than 8 weeks gestation of pregnancy: Secondary | ICD-10-CM | POA: Diagnosis not present

## 2015-11-29 DIAGNOSIS — O468X1 Other antepartum hemorrhage, first trimester: Secondary | ICD-10-CM | POA: Insufficient documentation

## 2015-11-29 DIAGNOSIS — Z91040 Latex allergy status: Secondary | ICD-10-CM | POA: Diagnosis not present

## 2015-11-29 DIAGNOSIS — Z91018 Allergy to other foods: Secondary | ICD-10-CM | POA: Diagnosis not present

## 2015-11-29 DIAGNOSIS — O09291 Supervision of pregnancy with other poor reproductive or obstetric history, first trimester: Secondary | ICD-10-CM

## 2015-11-29 DIAGNOSIS — N760 Acute vaginitis: Secondary | ICD-10-CM

## 2015-11-29 DIAGNOSIS — B9689 Other specified bacterial agents as the cause of diseases classified elsewhere: Secondary | ICD-10-CM | POA: Diagnosis not present

## 2015-11-29 DIAGNOSIS — K219 Gastro-esophageal reflux disease without esophagitis: Secondary | ICD-10-CM | POA: Diagnosis not present

## 2015-11-29 DIAGNOSIS — I10 Essential (primary) hypertension: Secondary | ICD-10-CM | POA: Insufficient documentation

## 2015-11-29 DIAGNOSIS — O23591 Infection of other part of genital tract in pregnancy, first trimester: Secondary | ICD-10-CM | POA: Diagnosis not present

## 2015-11-29 DIAGNOSIS — N83209 Unspecified ovarian cyst, unspecified side: Secondary | ICD-10-CM | POA: Diagnosis not present

## 2015-11-29 DIAGNOSIS — N83201 Unspecified ovarian cyst, right side: Secondary | ICD-10-CM | POA: Insufficient documentation

## 2015-11-29 DIAGNOSIS — O26899 Other specified pregnancy related conditions, unspecified trimester: Secondary | ICD-10-CM

## 2015-11-29 DIAGNOSIS — Z7982 Long term (current) use of aspirin: Secondary | ICD-10-CM | POA: Insufficient documentation

## 2015-11-29 LAB — WET PREP, GENITAL
Sperm: NONE SEEN
Trich, Wet Prep: NONE SEEN
Yeast Wet Prep HPF POC: NONE SEEN

## 2015-11-29 LAB — HCG, QUANTITATIVE, PREGNANCY: HCG, BETA CHAIN, QUANT, S: 99026 m[IU]/mL — AB (ref <5)

## 2015-11-29 LAB — CBC WITH DIFFERENTIAL/PLATELET
BASOS PCT: 0 %
Basophils Absolute: 0 10*3/uL (ref 0.0–0.1)
EOS ABS: 0.1 10*3/uL (ref 0.0–0.7)
Eosinophils Relative: 1 %
HEMATOCRIT: 34.7 % — AB (ref 36.0–46.0)
HEMOGLOBIN: 12 g/dL (ref 12.0–15.0)
Lymphocytes Relative: 29 %
Lymphs Abs: 2.2 10*3/uL (ref 0.7–4.0)
MCH: 33.7 pg (ref 26.0–34.0)
MCHC: 34.6 g/dL (ref 30.0–36.0)
MCV: 97.5 fL (ref 78.0–100.0)
MONOS PCT: 5 %
Monocytes Absolute: 0.4 10*3/uL (ref 0.1–1.0)
NEUTROS ABS: 4.8 10*3/uL (ref 1.7–7.7)
NEUTROS PCT: 65 %
Platelets: 259 10*3/uL (ref 150–400)
RBC: 3.56 MIL/uL — AB (ref 3.87–5.11)
RDW: 12.3 % (ref 11.5–15.5)
WBC: 7.4 10*3/uL (ref 4.0–10.5)

## 2015-11-29 LAB — URINALYSIS, ROUTINE W REFLEX MICROSCOPIC
BILIRUBIN URINE: NEGATIVE
Glucose, UA: NEGATIVE mg/dL
Ketones, ur: NEGATIVE mg/dL
Leukocytes, UA: NEGATIVE
Nitrite: NEGATIVE
PH: 5.5 (ref 5.0–8.0)
Protein, ur: NEGATIVE mg/dL
SPECIFIC GRAVITY, URINE: 1.02 (ref 1.005–1.030)

## 2015-11-29 LAB — POCT PREGNANCY, URINE: Preg Test, Ur: POSITIVE — AB

## 2015-11-29 LAB — URINE MICROSCOPIC-ADD ON

## 2015-11-29 MED ORDER — LABETALOL HCL 200 MG PO TABS: 200.0000 mg | ORAL_TABLET | Freq: Two times a day (BID) | ORAL | Status: DC

## 2015-11-29 MED ORDER — METRONIDAZOLE 500 MG PO TABS: 500.0000 mg | ORAL_TABLET | Freq: Two times a day (BID) | ORAL | Status: DC

## 2015-11-29 NOTE — MAU Provider Note (Signed)
History     CSN: [id number]  Arrival date and time: 11/29/15 1120   None     Chief Complaint  Patient presents with  . Abdominal Cramping  . Possible Pregnancy   HPI Suzanne [name] is 40 y.o. G2P0010 [redacted]w[redacted]d weeks presenting with lower abdominal cramping.  Began around midnight, interrupted sleep.  Easing off.  At its peak 7-8/10 and now 5/10. Neg for vaginal bleeding, a "little" discharge.  1 partner X 2 yrs.  Hx of Miscarriage in Feb.  LMP 3/24.  Depo injection given 11/05/15.  At time of miscarriage, we have record of [facility]CG on 2/13 395, 2/14 475 and on 2/16 141 and on 2/24 <2.    Past Medical History  Diagnosis Date  . Hypertension   . GERD (gastroesophageal reflux disease)     Past Surgical History  Procedure Laterality Date  . Colposcopy    . Cervical biopsy      Family History  Problem Relation Age of Onset  . Diabetes Mother   . Hypertension Mother   . GER disease Mother     Social History  Substance Use Topics  . Smoking status: Current Every Day Smoker -- 0.30 packs/day for 15 years    Types: Cigarettes  . Smokeless tobacco: Never Used  . Alcohol Use: No     Comment: rare    Allergies:  Allergies  Allergen Reactions  . Coconut Flavor Nausea Only        . Latex Itching  . Pineapple Itching and Swelling    Tongue swelling  . Tomato Itching and Swelling    Tongue swelling.     Prescriptions prior to admission  Medication Sig Dispense Refill Last Dose  . amLODipine (NORVASC) 10 MG tablet Take 10 mg by mouth daily.   09/09/2015 at Unknown time  . aspirin EC 81 MG tablet Take 81 mg by mouth daily.   Past Week at Unknown time  . hydrochlorothiazide (HYDRODIURIL) 25 MG tablet Take 25 mg by mouth daily.   09/09/2015 at Unknown time  . lisinopril (PRINIVIL,ZESTRIL) 10 MG tablet Take 10 mg by mouth daily.   09/09/2015 at Unknown time  . Multiple Vitamin (MULTIVITAMIN WITH MINERALS) TABS tablet Take 1 tablet by mouth daily.   Past Week at Unknown time  .  omeprazole (PRILOSEC) 20 MG capsule Take 20 mg by mouth daily.   09/09/2015 at Unknown time    Review of Systems  Constitutional: Negative for fever and chills.  Cardiovascular: Negative for chest pain.  Gastrointestinal: Positive for abdominal pain. Negative for nausea and vomiting.  Genitourinary: Negative for dysuria, urgency, frequency and hematuria.       Neg for vaginal bleeding.  + for vaginal discharge.   Neurological: Negative for headaches.   Physical Exam   Blood pressure 114/79, pulse 80, temperature 98.4 F (36.9 C), temperature source Oral, resp. rate 20, height 5' 5.5" (1.664 m), weight 197 lb (89.359 kg), last menstrual period 10/18/2015, unknown if currently breastfeeding.  Physical Exam  Constitutional: She is oriented to person, place, and time. She appears well-developed and well-nourished. No distress.  HENT:  Head: Normocephalic.  Neck: Normal range of motion.  Cardiovascular: Normal rate.   Respiratory: Effort normal.  GI: Soft. She exhibits no distension and no mass. There is no tenderness. There is no rebound and no guarding.  Genitourinary: There is no rash, tenderness or lesion on the right labia. There is no rash, tenderness or lesion on the left labia. Uterus is not  enlarged and not tender. Cervix exhibits no motion tenderness, no discharge and no friability. Right adnexum displays no mass, no tenderness and no fullness. Left adnexum displays no mass, no tenderness and no fullness. No bleeding in the vagina. Vaginal discharge (moderate amount of frothy discharge with mild odor.) found.  Neurological: She is alert and oriented to person, place, and time.  Skin: Skin is warm and dry.  Psychiatric: She has a normal mood and affect. Her behavior is normal. Thought content normal.    Results for orders placed or performed during the hospital encounter of 11/29/15 (from the past 24 hour(s))  Urinalysis, Routine w reflex microscopic (not at [facility])     Status:  Abnormal   Collection Time: 11/29/15 11:30 AM  Result Value Ref Range   Color, Urine YELLOW YELLOW   APPearance CLEAR CLEAR   Specific Gravity, Urine 1.020 1.005 - 1.030   pH 5.5 5.0 - 8.0   Glucose, UA NEGATIVE NEGATIVE mg/dL   Hgb urine dipstick TRACE (A) NEGATIVE   Bilirubin Urine NEGATIVE NEGATIVE   Ketones, ur NEGATIVE NEGATIVE mg/dL   Protein, ur NEGATIVE NEGATIVE mg/dL   Nitrite NEGATIVE NEGATIVE   Leukocytes, UA NEGATIVE NEGATIVE  Urine microscopic-add on     Status: Abnormal   Collection Time: 11/29/15 11:30 AM  Result Value Ref Range   Squamous Epithelial / LPF 0-5 (A) NONE SEEN   WBC, UA 0-5 0 - 5 WBC/hpf   RBC / HPF 0-5 0 - 5 RBC/hpf   Bacteria, UA FEW (A) NONE SEEN  Pregnancy, urine POC     Status: Abnormal   Collection Time: 11/29/15 11:34 AM  Result Value Ref Range   Preg Test, Ur POSITIVE (A) NEGATIVE  Wet prep, genital     Status: Abnormal   Collection Time: 11/29/15 12:15 PM  Result Value Ref Range   Yeast Wet Prep HPF POC NONE SEEN NONE SEEN   Trich, Wet Prep NONE SEEN NONE SEEN   Clue Cells Wet Prep HPF POC PRESENT (A) NONE SEEN   WBC, Wet Prep HPF POC MODERATE (A) NONE SEEN   Sperm NONE SEEN   CBC with Differential/Platelet     Status: Abnormal   Collection Time: 11/29/15 12:22 PM  Result Value Ref Range   WBC 7.4 4.0 - 10.5 K/uL   RBC 3.56 (L) 3.87 - 5.11 MIL/uL   Hemoglobin 12.0 12.0 - 15.0 g/dL   HCT 40.9 (L) 81.1 - 91.4 %   MCV 97.5 78.0 - 100.0 fL   MCH 33.7 26.0 - 34.0 pg   MCHC 34.6 30.0 - 36.0 g/dL   RDW 78.2 95.6 - 21.3 %   Platelets 259 150 - 400 K/uL   Neutrophils Relative % 65 %   Neutro Abs 4.8 1.7 - 7.7 K/uL   Lymphocytes Relative 29 %   Lymphs Abs 2.2 0.7 - 4.0 K/uL   Monocytes Relative 5 %   Monocytes Absolute 0.4 0.1 - 1.0 K/uL   Eosinophils Relative 1 %   Eosinophils Absolute 0.1 0.0 - 0.7 K/uL   Basophils Relative 0 %   Basophils Absolute 0.0 0.0 - 0.1 K/uL  hCG, quantitative, pregnancy     Status: Abnormal   Collection  Time: 11/29/15 12:22 PM  Result Value Ref Range   hCG, Beta Chain, Quant, S 99026 (H) <5 mIU/mL   BLOOD TYPE per previous record B Positive.   US Ob Comp Less 14 Wks  11/29/2015  CLINICAL DATA:  Pregnant patient with abdominal cramping.  EXAM: OBSTETRIC <14 WK US AND TRANSVAGINAL OB US TECHNIQUE: Both transabdominal and transvaginal ultrasound examinations were performed for complete evaluation of the gestation as well as the maternal uterus, adnexal regions, and pelvic cul-de-sac. Transvaginal technique was performed to assess early pregnancy. COMPARISON:  Pelvic ultrasound 09/09/2015 FINDINGS: Intrauterine gestational sac: Single Yolk sac:  Present Embryo:  Present Cardiac Activity: Present Heart Rate: 132  bpm CRL:  7.9  mm   6 w   5 d                  US EDC: 07/19/2016 Subchorionic hemorrhage:  Small Maternal uterus/adnexae: Small hemorrhagic cyst within the right ovary. Small simple cysts within the left ovary. Trace free fluid in the pelvis. IMPRESSION: Single live intrauterine gestation.  Small subchorionic hemorrhage. Electronically Signed   By: Annia Beltrew  Davis M.D.   On: 11/29/2015 14:17   [place] Ob Transvaginal  11/29/2015  CLINICAL DATA:  Pregnant patient with abdominal cramping. EXAM: OBSTETRIC <14 WK US AND TRANSVAGINAL OB US TECHNIQUE: Both transabdominal and transvaginal ultrasound examinations were performed for complete evaluation of the gestation as well as the maternal uterus, adnexal regions, and pelvic cul-de-sac. Transvaginal technique was performed to assess early pregnancy. COMPARISON:  Pelvic ultrasound 09/09/2015 FINDINGS: Intrauterine gestational sac: Single Yolk sac:  Present Embryo:  Present Cardiac Activity: Present Heart Rate: 132  bpm CRL:  7.9  mm   6 w   5 d                  US EDC: 07/19/2016 Subchorionic hemorrhage:  Small Maternal uterus/adnexae: Small hemorrhagic cyst within the right ovary. Small simple cysts within the left ovary. Trace free fluid in the pelvis. IMPRESSION:  Single live intrauterine gestation.  Small subchorionic hemorrhage. Electronically Signed   By: Annia Beltrew  Davis M.D.   On: 11/29/2015 14:17   MAU Course  Procedures  GC/CHL culture and HIV pending  MDM MSE Lab Exam U/S Labs and U/S results reviewed with Andrell.   Assessment and Plan  A:  Cramping in early pregnancy      Viable Intrauterine pregnancy at 7723w5d        Small Subchorionic hemorrhage      Small right hemorrhagic cyst      Bacterial vaginosis  P: Patient instructed to begin prenatal care with MD of her choice     Rx for Flagyl to pharmacy     May take tylenol for discomfort  Dyquan Minks,EVE M 11/29/2015, 2:29 PM

## 2015-11-29 NOTE — Discharge Instructions (Signed)
Bacterial Vaginosis Bacterial vaginosis is a vaginal infection that occurs when the normal balance of bacteria in the vagina is disrupted. It results from an overgrowth of certain bacteria. This is the most common vaginal infection in women of childbearing age. Treatment is important to prevent complications, especially in pregnant women, as it can cause a premature delivery. CAUSES  Bacterial vaginosis is caused by an increase in harmful bacteria that are normally present in smaller amounts in the vagina. Several different kinds of bacteria can cause bacterial vaginosis. However, the reason that the condition develops is not fully understood. RISK FACTORS Certain activities or behaviors can put you at an increased risk of developing bacterial vaginosis, including:  Having a new sex partner or multiple sex partners.  Douching.  Using an intrauterine device (IUD) for contraception. Women do not get bacterial vaginosis from toilet seats, bedding, swimming pools, or contact with objects around them. SIGNS AND SYMPTOMS  Some women with bacterial vaginosis have no signs or symptoms. Common symptoms include:  Grey vaginal discharge.  A fishlike odor with discharge, especially after sexual intercourse.  Itching or burning of the vagina and vulva.  Burning or pain with urination. DIAGNOSIS  Your health care provider will take a medical history and examine the vagina for signs of bacterial vaginosis. A sample of vaginal fluid may be taken. Your health care provider will look at this sample under a microscope to check for bacteria and abnormal cells. A vaginal pH test may also be done.  TREATMENT  Bacterial vaginosis may be treated with antibiotic medicines. These may be given in the form of a pill or a vaginal cream. A second round of antibiotics may be prescribed if the condition comes back after treatment. Because bacterial vaginosis increases your risk for sexually transmitted diseases, getting  treated can help reduce your risk for chlamydia, gonorrhea, HIV, and herpes. HOME CARE INSTRUCTIONS   Only take over-the-counter or prescription medicines as directed by your health care provider.  If antibiotic medicine was prescribed, take it as directed. Make sure you finish it even if you start to feel better.  Tell all sexual partners that you have a vaginal infection. They should see their health care provider and be treated if they have problems, such as a mild rash or itching.  During treatment, it is important that you follow these instructions:  Avoid sexual activity or use condoms correctly.  Do not douche.  Avoid alcohol as directed by your health care provider.  Avoid breastfeeding as directed by your health care provider. SEEK MEDICAL CARE IF:   Your symptoms are not improving after 3 days of treatment.  You have increased discharge or pain.  You have a fever. MAKE SURE YOU:   Understand these instructions.  Will watch your condition.  Will get help right away if you are not doing well or get worse. FOR MORE INFORMATION  Centers for Disease Control and Prevention, Division of STD Prevention: SolutionApps.co.za American Sexual Health Association (ASHA): www.ashastd.org    This information is not intended to replace advice given to you by your health care provider. Make sure you discuss any questions you have with your health care provider.   Document Released: 07/13/2005 Document Revised: 08/03/2014 Document Reviewed: 02/22/2013 Elsevier Interactive Patient Education 2016 Elsevier Inc. Hypertension During Pregnancy Hypertension is also called high blood pressure. Blood pressure moves blood in your body. Sometimes, the force that moves the blood becomes too strong. When you are pregnant, this condition should be watched carefully.  It can cause problems for you and your baby. HOME CARE   Make and keep all of your doctor visits.  Take medicine as told by your  doctor. Tell your doctor about all medicines you take.  Eat very little salt.  Exercise regularly.  Do not drink alcohol.  Do not smoke.  Do not have drinks with caffeine.  Lie on your left side when resting.  Your health care provider may ask you to take one low-dose aspirin (81mg ) each day. GET HELP RIGHT AWAY IF:  You have bad belly (abdominal) pain.  You have sudden puffiness (swelling) in the hands, ankles, or face.  You gain 4 pounds (1.8 kilograms) or more in 1 week.  You throw up (vomit) repeatedly.  You have bleeding from the vagina.  You do not feel the baby moving as much.  You have a headache.  You have blurred or double vision.  You have muscle twitching or spasms.  You have shortness of breath.  You have blue fingernails and lips.  You have blood in your pee (urine). MAKE SURE YOU:  Understand these instructions.  Will watch your condition.  Will get help right away if you are not doing well or get worse.   This information is not intended to replace advice given to you by your health care provider. Make sure you discuss any questions you have with your health care provider.   Document Released: 08/15/2010 Document Revised: 08/03/2014 Document Reviewed: 02/09/2013 Elsevier Interactive Patient Education 2016 Elsevier Inc. Abdominal Pain During Pregnancy Belly (abdominal) pain is common during pregnancy. Most of the time, it is not a serious problem. Other times, it can be a sign that something is wrong with the pregnancy. Always tell your doctor if you have belly pain. HOME CARE Monitor your belly pain for any changes. The following actions may help you feel better:  Do not have sex (intercourse) or put anything in your vagina until you feel better.  Rest until your pain stops.  Drink clear fluids if you feel sick to your stomach (nauseous). Do not eat solid food until you feel better.  Only take medicine as told by your doctor.  Keep all  doctor visits as told. GET HELP RIGHT AWAY IF:   You are bleeding, leaking fluid, or pieces of tissue come out of your vagina.  You have more pain or cramping.  You keep throwing up (vomiting).  You have pain when you pee (urinate) or have blood in your pee.  You have a fever.  You do not feel your baby moving as much.  You feel very weak or feel like passing out.  You have trouble breathing, with or without belly pain.  You have a very bad headache and belly pain.  You have fluid leaking from your vagina and belly pain.  You keep having watery poop (diarrhea).  Your belly pain does not go away after resting, or the pain gets worse. MAKE SURE YOU:   Understand these instructions.  Will watch your condition.  Will get help right away if you are not doing well or get worse.   This information is not intended to replace advice given to you by your health care provider. Make sure you discuss any questions you have with your health care provider.   Document Released: 07/01/2009 Document Revised: 03/15/2013 Document Reviewed: 02/09/2013 Elsevier Interactive Patient Education 2016 Elsevier Inc. Ovarian Cyst An ovarian cyst is a sac filled with fluid or blood. This sac  is attached to the ovary. Some cysts go away on their own. Other cysts need treatment.  HOME CARE   Only take medicine as told by your doctor.  Follow up with your doctor as told.  Get regular pelvic exams and Pap tests. GET HELP IF:  Your periods are late, not regular, or painful.  You stop having periods.  Your belly (abdominal) or pelvic pain does not go away.  Your belly becomes large or puffy (swollen).  You have a hard time peeing (totally emptying your bladder).  You have pressure on your bladder.  You have pain during sex.  You feel fullness, pressure, or discomfort in your belly.  You lose weight for no reason.  You feel sick most of the time.  You have a hard time pooping  (constipation).  You do not feel like eating.  You develop pimples (acne).  You have an increase in hair on your body and face.  You are gaining weight for no reason.  You think you are pregnant. GET HELP RIGHT AWAY IF:   Your belly pain gets worse.  You feel sick to your stomach (nauseous), and you throw up (vomit).  You have a fever that comes on fast.  You have belly pain while pooping (bowel movement).  Your periods are heavier than usual. MAKE SURE YOU:   Understand these instructions.  Will watch your condition.  Will get help right away if you are not doing well or get worse.   This information is not intended to replace advice given to you by your health care provider. Make sure you discuss any questions you have with your health care provider.   Document Released: 12/30/2007 Document Revised: 05/03/2013 Document Reviewed: 03/20/2013 Elsevier Interactive Patient Education 2016 Elsevier Inc. Subchorionic Hematoma A subchorionic hematoma is a gathering of blood between the outer wall of the placenta and the inner wall of the womb (uterus). The placenta is the organ that connects the fetus to the wall of the uterus. The placenta performs the feeding, breathing (oxygen to the fetus), and waste removal (excretory work) of the fetus.  Subchorionic hematoma is the most common abnormality found on a result from ultrasonography done during the first trimester or early second trimester of pregnancy. If there has been little or no vaginal bleeding, early small hematomas usually shrink on their own and do not affect your baby or pregnancy. The blood is gradually absorbed over 1-2 weeks. When bleeding starts later in pregnancy or the hematoma is larger or occurs in an older pregnant woman, the outcome may not be as good. Larger hematomas may get bigger, which increases the chances for miscarriage. Subchorionic hematoma also increases the risk of premature detachment of the placenta  from the uterus, preterm (premature) labor, and stillbirth. HOME CARE INSTRUCTIONS  Stay on bed rest if your health care provider recommends this. Although bed rest will not prevent more bleeding or prevent a miscarriage, your health care provider may recommend bed rest until you are advised otherwise.  Avoid heavy lifting (more than 10 lb [4.5 kg]), exercise, sexual intercourse, or douching as directed by your health care provider.  Keep track of the number of pads you use each day and how soaked (saturated) they are. Write down this information.  Do not use tampons.  Keep all follow-up appointments as directed by your health care provider. Your health care provider may ask you to have follow-up blood tests or ultrasound tests or both. SEEK IMMEDIATE MEDICAL CARE IF:  You have severe cramps in your stomach, back, abdomen, or pelvis.  You have a fever.  You pass large clots or tissue. Save any tissue for your health care provider to look at.  Your bleeding increases or you become lightheaded, feel weak, or have fainting episodes.   This information is not intended to replace advice given to you by your health care provider. Make sure you discuss any questions you have with your health care provider.   Document Released: 10/28/2006 Document Revised: 08/03/2014 Document Reviewed: 02/09/2013 Elsevier Interactive Patient Education [employer] Elsevier Inc.

## 2015-11-29 NOTE — MAU Note (Signed)
Had miscarriage in Feb.  LMP 3/24, had depo 4/11.  Is having a lot of cramping.

## 2015-11-30 LAB — HIV ANTIBODY (ROUTINE TESTING W REFLEX): HIV Screen 4th Generation wRfx: NONREACTIVE

## 2015-12-02 LAB — GC/CHLAMYDIA PROBE AMP (~~LOC~~) NOT AT ARMC
CHLAMYDIA, DNA PROBE: NEGATIVE
NEISSERIA GONORRHEA: NEGATIVE

## 2016-01-09 ENCOUNTER — Encounter: Payer: Self-pay | Admitting: Family Medicine

## 2016-01-09 ENCOUNTER — Ambulatory Visit (INDEPENDENT_AMBULATORY_CARE_PROVIDER_SITE_OTHER): Payer: BLUE CROSS/BLUE SHIELD | Admitting: Family Medicine

## 2016-01-09 ENCOUNTER — Ambulatory Visit (INDEPENDENT_AMBULATORY_CARE_PROVIDER_SITE_OTHER): Payer: Self-pay | Admitting: Clinical

## 2016-01-09 VITALS — BP 119/71 | HR 84 | Wt 203.7 lb

## 2016-01-09 DIAGNOSIS — Z124 Encounter for screening for malignant neoplasm of cervix: Secondary | ICD-10-CM

## 2016-01-09 DIAGNOSIS — O09529 Supervision of elderly multigravida, unspecified trimester: Secondary | ICD-10-CM | POA: Insufficient documentation

## 2016-01-09 DIAGNOSIS — F4323 Adjustment disorder with mixed anxiety and depressed mood: Secondary | ICD-10-CM

## 2016-01-09 DIAGNOSIS — Z113 Encounter for screening for infections with a predominantly sexual mode of transmission: Secondary | ICD-10-CM | POA: Diagnosis not present

## 2016-01-09 DIAGNOSIS — Z1151 Encounter for screening for human papillomavirus (HPV): Secondary | ICD-10-CM

## 2016-01-09 DIAGNOSIS — Z36 Encounter for antenatal screening of mother: Secondary | ICD-10-CM | POA: Diagnosis not present

## 2016-01-09 DIAGNOSIS — K219 Gastro-esophageal reflux disease without esophagitis: Secondary | ICD-10-CM | POA: Insufficient documentation

## 2016-01-09 DIAGNOSIS — I10 Essential (primary) hypertension: Secondary | ICD-10-CM

## 2016-01-09 DIAGNOSIS — O10011 Pre-existing essential hypertension complicating pregnancy, first trimester: Secondary | ICD-10-CM

## 2016-01-09 DIAGNOSIS — O0990 Supervision of high risk pregnancy, unspecified, unspecified trimester: Secondary | ICD-10-CM | POA: Insufficient documentation

## 2016-01-09 DIAGNOSIS — O09521 Supervision of elderly multigravida, first trimester: Secondary | ICD-10-CM

## 2016-01-09 DIAGNOSIS — O3680X1 Pregnancy with inconclusive fetal viability, fetus 1: Secondary | ICD-10-CM

## 2016-01-09 DIAGNOSIS — O0991 Supervision of high risk pregnancy, unspecified, first trimester: Secondary | ICD-10-CM

## 2016-01-09 DIAGNOSIS — O3680X0 Pregnancy with inconclusive fetal viability, not applicable or unspecified: Secondary | ICD-10-CM | POA: Diagnosis not present

## 2016-01-09 DIAGNOSIS — O10019 Pre-existing essential hypertension complicating pregnancy, unspecified trimester: Secondary | ICD-10-CM | POA: Insufficient documentation

## 2016-01-09 LAB — POCT URINALYSIS DIP (DEVICE)
BILIRUBIN URINE: NEGATIVE
Glucose, UA: NEGATIVE mg/dL
KETONES UR: NEGATIVE mg/dL
Nitrite: NEGATIVE
PH: 5.5 (ref 5.0–8.0)
PROTEIN: NEGATIVE mg/dL
SPECIFIC GRAVITY, URINE: 1.025 (ref 1.005–1.030)
Urobilinogen, UA: 0.2 mg/dL (ref 0.0–1.0)

## 2016-01-09 MED ORDER — ASPIRIN 81 MG PO CHEW: 81.0000 mg | CHEWABLE_TABLET | Freq: Every day | ORAL | Status: DC

## 2016-01-09 MED ORDER — CONCEPT OB 130-92.4-1 MG PO CAPS: 1.0000 | ORAL_CAPSULE | Freq: Every day | ORAL | Status: DC

## 2016-01-09 NOTE — Progress Notes (Signed)
Bedside US for viability = Single IUP; FHR - 160 bpm per M-mode; FM present

## 2016-01-09 NOTE — Progress Notes (Signed)
First trimester screen and genetic counseling scheduled 06/28 @ 1100am.

## 2016-01-09 NOTE — Progress Notes (Signed)
   Subjective:    Suzanne [name] StableJamila Chamberland is a G2P0010 427w6d being seen today for her first obstetrical visit.  Her obstetrical history is significant for advanced maternal age, smoker and hypertension. Quit 12/2015. Patient does intend to breast feed. Pregnancy history fully reviewed.  Patient reports nausea.  Filed Vitals:   01/09/16 0900  BP: 119/71  Pulse: 84  Weight: 203 lb 11.2 oz (92.398 kg)    HISTORY: OB History  Gravida Para Term Preterm AB SAB TAB Ectopic Multiple Living  2    1 1         # Outcome Date GA Lbr Len/2nd Weight Sex Delivery Anes PTL Lv  2 Current           1 SAB 08/2015             Past Medical History  Diagnosis Date  . Hypertension   . GERD (gastroesophageal reflux disease)   . Sciatica    Past Surgical History  Procedure Laterality Date  . Colposcopy    . Cervical biopsy     Family History  Problem Relation Age of Onset  . Diabetes Mother   . Hypertension Mother   . GER disease Mother      Exam    Uterus:   12 wk size  Pelvic Exam:    Perineum: Normal Perineum   Vulva: Bartholin's, Urethra, Skene's normal   Vagina:  normal mucosa, normal discharge   Cervix: no bleeding following Pap and no lesions   Adnexa: normal adnexa   Bony Pelvis: average  System: Breast:  normal appearance, no masses or tenderness, pendulous   Skin: normal coloration and turgor, no rashes    Neurologic: oriented   Extremities: normal strength, tone, and muscle mass   HEENT extra ocular movement intact and sclera clear, anicteric   Mouth/Teeth mucous membranes moist, pharynx normal without lesions   Neck Supple, no thyromegaly   Cardiovascular: regular rate and rhythm, no murmurs or gallops   Respiratory:  appears well, vitals normal, no respiratory distress, acyanotic, normal RR, ear and throat exam is normal, neck free of mass or lymphadenopathy, chest clear, no wheezing, crepitations, rhonchi, normal symmetric air entry   Abdomen: soft, non-tender; bowel  sounds normal; no masses,  no organomegaly      Assessment/Plan:  1. Supervision of high risk pregnancy, antepartum, first trimester - Prenatal Profile - Hemoglobinopathy evaluation - Glucose Tolerance, 1 HR (50g) w/o Fasting - GC/Chlamydia probe amp ([place] - Cytology - PAP - Culture, OB Urine - [id number] PDM PROFILE - Prenat w/o A Vit-FeFum-FePo-FA (CONCEPT OB) 130-92.4-1 MG CAPS; Take 1 capsule by mouth daily.  Dispense: 30 capsule; Refill: 11  2. AMA (advanced maternal age) multigravida 35+, first trimester Genetics referral - AMB MFM GENETICS REFERRAL - US MFM Fetal Nuchal Translucency; Future  3. Gastroesophageal reflux disease without esophagitis Continue Prilosec  4. Hypertension, benign Continue Labetalol  5. Hypertension in pregnancy, essential, antepartum, first trimester Baseline labs Serial u/s and care discussed with patient. - Comprehensive metabolic panel - Protein / creatinine ratio, urine - aspirin 81 MG chewable tablet; Chew 1 tablet (81 mg total) by mouth daily.  Dispense: 90 tablet; Refill: 2   Parys Elenbaas S 01/09/2016

## 2016-01-09 NOTE — Progress Notes (Signed)
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  Pt would benefit from brief therapeutic interventions regarding coping with adjustment.  Pt may benefit from additional community support, including grief support.  Stage of Change: contemplative  PLAN: 1. F/U with behavioral health clinician in as needed 2. Psychiatric Medications: none 3. Behavioral recommendations:   -Call list of agencies provided that may help with utilities -[facility] supports -Consider [facility] for grief support -Continue spending time with friends and family -Continue writing and singing songs   SUBJECTIVE: Pt. referred by Dr Shawnie PonsPratt for life stressors Pt. reports the following symptoms/concerns: Pt states that she miscarried her first pregnancy earlier in the year, and is feeling some stress over having first baby at 40yo while going to school, unemployed, and relationship breakup with FOB. Pt says that she has a full support system, including family, friends, and is active with her church family. Her primary stress today is financial, as she is having difficulty paying current utilities, while experiencing severe morning sickness, which makes schoolwork difficult.  Duration of problem: Less than one month Severity: mild   OBJECTIVE: Orientation & Cognition: Oriented x3. Thought processes normal and appropriate to situation. Mood: appropriate Affect: appropriate Appearance: appropriate Risk of harm to self or others: no known risk of harm to self or others Substance use: none known Assessments administered: PHQ9: 7/ GAD7: 6  Diagnosis: Adjustment disorder with mixed anxiety and depressed mood, mild CPT Code: F43.23  -------------------------------------------- Other(s) present in the room: none  Time spent with patient in exam room:30 minutes 9:10am to 9:40am    Depression screen  PHQ 2/9 01/09/2016  Decreased Interest 0  Down, Depressed, Hopeless 1  PHQ - 2 Score 1  Altered sleeping 1  Tired, decreased energy 2  Change in appetite 1  Feeling bad or failure about yourself  0  Trouble concentrating 2  Moving slowly or fidgety/restless 0  Suicidal thoughts 0  PHQ-9 Score 7  Difficult doing work/chores Not difficult at all    GAD 7 : Generalized Anxiety Score 01/09/2016  Nervous, Anxious, on Edge 1  Control/stop worrying 1  Worry too much - different things 1  Trouble relaxing 1  Restless 0  Easily annoyed or irritable 2  Afraid - awful might happen 0  Total GAD 7 Score 6  Anxiety Difficulty Not difficult at all

## 2016-01-09 NOTE — Patient Instructions (Signed)
 First Trimester of Pregnancy The first trimester of pregnancy is from week 1 until the end of week 12 (months 1 through 3). A week after a sperm fertilizes an egg, the egg will implant on the wall of the uterus. This embryo will begin to develop into a baby. Genes from you and your partner are forming the baby. The female genes determine whether the baby is a boy or a girl. At 6-8 weeks, the eyes and face are formed, and the heartbeat can be seen on ultrasound. At the end of 12 weeks, all the baby's organs are formed.  Now that you are pregnant, you will want to do everything you can to have a healthy baby. Two of the most important things are to get good prenatal care and to follow your health care provider's instructions. Prenatal care is all the medical care you receive before the baby's birth. This care will help prevent, find, and treat any problems during the pregnancy and childbirth. BODY CHANGES Your body goes through many changes during pregnancy. The changes vary from woman to woman.   You may gain or lose a couple of pounds at first.  You may feel sick to your stomach (nauseous) and throw up (vomit). If the vomiting is uncontrollable, call your health care provider.  You may tire easily.  You may develop headaches that can be relieved by medicines approved by your health care provider.  You may urinate more often. Painful urination may mean you have a bladder infection.  You may develop heartburn as a result of your pregnancy.  You may develop constipation because certain hormones are causing the muscles that push waste through your intestines to slow down.  You may develop hemorrhoids or swollen, bulging veins (varicose veins).  Your breasts may begin to grow larger and become tender. Your nipples may stick out more, and the tissue that surrounds them (areola) may become darker.  Your gums may bleed and may be sensitive to brushing and flossing.  Dark spots or blotches  (chloasma, mask of pregnancy) may develop on your face. This will likely fade after the baby is born.  Your menstrual periods will stop.  You may have a loss of appetite.  You may develop cravings for certain kinds of food.  You may have changes in your emotions from day to day, such as being excited to be pregnant or being concerned that something may go wrong with the pregnancy and baby.  You may have more vivid and strange dreams.  You may have changes in your hair. These can include thickening of your hair, rapid growth, and changes in texture. Some women also have hair loss during or after pregnancy, or hair that feels dry or thin. Your hair will most likely return to normal after your baby is born. WHAT TO EXPECT AT YOUR PRENATAL VISITS During a routine prenatal visit:  You will be weighed to make sure you and the baby are growing normally.  Your blood pressure will be taken.  Your abdomen will be measured to track your baby's growth.  The fetal heartbeat will be listened to starting around week 10 or 12 of your pregnancy.  Test results from any previous visits will be discussed. Your health care provider may ask you:  How you are feeling.  If you are feeling the baby move.  If you have had any abnormal symptoms, such as leaking fluid, bleeding, severe headaches, or abdominal cramping.  If you are using any tobacco   products, including cigarettes, chewing tobacco, and electronic cigarettes.  If you have any questions. Other tests that may be performed during your first trimester include:  Blood tests to find your blood type and to check for the presence of any previous infections. They will also be used to check for low iron levels (anemia) and Rh antibodies. Later in the pregnancy, blood tests for diabetes will be done along with other tests if problems develop.  Urine tests to check for infections, diabetes, or protein in the urine.  An ultrasound to confirm the  proper growth and development of the baby.  An amniocentesis to check for possible genetic problems.  Fetal screens for spina bifida and Down syndrome.  You may need other tests to make sure you and the baby are doing well.  HIV (human immunodeficiency virus) testing. Routine prenatal testing includes screening for HIV, unless you choose not to have this test. HOME CARE INSTRUCTIONS  Medicines  Follow your health care provider's instructions regarding medicine use. Specific medicines may be either safe or unsafe to take during pregnancy.  Take your prenatal vitamins as directed.  If you develop constipation, try taking a stool softener if your health care provider approves. Diet  Eat regular, well-balanced meals. Choose a variety of foods, such as meat or vegetable-based protein, fish, milk and low-fat dairy products, vegetables, fruits, and whole grain breads and cereals. Your health care provider will help you determine the amount of weight gain that is right for you.  Avoid raw meat and uncooked cheese. These carry germs that can cause birth defects in the baby.  Eating four or five small meals rather than three large meals a day may help relieve nausea and vomiting. If you start to feel nauseous, eating a few soda crackers can be helpful. Drinking liquids between meals instead of during meals also seems to help nausea and vomiting.  If you develop constipation, eat more high-fiber foods, such as fresh vegetables or fruit and whole grains. Drink enough fluids to keep your urine clear or pale yellow. Activity and Exercise  Exercise only as directed by your health care provider. Exercising will help you:  Control your weight.  Stay in shape.  Be prepared for labor and delivery.  Experiencing pain or cramping in the lower abdomen or low back is a good sign that you should stop exercising. Check with your health care provider before continuing normal exercises.  Try to avoid  standing for long periods of time. Move your legs often if you must stand in one place for a long time.  Avoid heavy lifting.  Wear low-heeled shoes, and practice good posture.  You may continue to have sex unless your health care provider directs you otherwise. Relief of Pain or Discomfort  Wear a good support bra for breast tenderness.   Take warm sitz baths to soothe any pain or discomfort caused by hemorrhoids. Use hemorrhoid cream if your health care provider approves.   Rest with your legs elevated if you have leg cramps or low back pain.  If you develop varicose veins in your legs, wear support hose. Elevate your feet for 15 minutes, 3-4 times a day. Limit salt in your diet. Prenatal Care  Schedule your prenatal visits by the twelfth week of pregnancy. They are usually scheduled monthly at first, then more often in the last 2 months before delivery.  Write down your questions. Take them to your prenatal visits.  Keep all your prenatal visits as directed by   your health care provider. Safety  Wear your seat belt at all times when driving.  Make a list of emergency phone numbers, including numbers for family, friends, the hospital, and police and fire departments. General Tips  Ask your health care provider for a referral to a local prenatal education class. Begin classes no later than at the beginning of month 6 of your pregnancy.  Ask for help if you have counseling or nutritional needs during pregnancy. Your health care provider can offer advice or refer you to specialists for help with various needs.  Do not use hot tubs, steam rooms, or saunas.  Do not douche or use tampons or scented sanitary pads.  Do not cross your legs for long periods of time.  Avoid cat litter boxes and soil used by cats. These carry germs that can cause birth defects in the baby and possibly loss of the fetus by miscarriage or stillbirth.  Avoid all smoking, herbs, alcohol, and medicines  not prescribed by your health care provider. Chemicals in these affect the formation and growth of the baby.  Do not use any tobacco products, including cigarettes, chewing tobacco, and electronic cigarettes. If you need help quitting, ask your health care provider. You may receive counseling support and other resources to help you quit.  Schedule a dentist appointment. At home, brush your teeth with a soft toothbrush and be gentle when you floss. SEEK MEDICAL CARE IF:   You have dizziness.  You have mild pelvic cramps, pelvic pressure, or nagging pain in the abdominal area.  You have persistent nausea, vomiting, or diarrhea.  You have a bad smelling vaginal discharge.  You have pain with urination.  You notice increased swelling in your face, hands, legs, or ankles. SEEK IMMEDIATE MEDICAL CARE IF:   You have a fever.  You are leaking fluid from your vagina.  You have spotting or bleeding from your vagina.  You have severe abdominal cramping or pain.  You have rapid weight gain or loss.  You vomit blood or material that looks like coffee grounds.  You are exposed to German measles and have never had them.  You are exposed to fifth disease or chickenpox.  You develop a severe headache.  You have shortness of breath.  You have any kind of trauma, such as from a fall or a car accident.   This information is not intended to replace advice given to you by your health care provider. Make sure you discuss any questions you have with your health care provider.   Document Released: 07/07/2001 Document Revised: 08/03/2014 Document Reviewed: 05/23/2013 Elsevier Interactive Patient Education 2016 [employer]   Breastfeeding Deciding to breastfeed is one of the best choices you can make for you and your baby. A change in hormones during pregnancy causes your breast tissue to grow and increases the number and size of your milk ducts. These hormones also allow proteins, sugars,  and fats from your blood supply to make breast milk in your milk-producing glands. Hormones prevent breast milk from being released before your baby is born as well as prompt milk flow after birth. Once breastfeeding has begun, thoughts of your baby, as well as his or her sucking or crying, can stimulate the release of milk from your milk-producing glands.  BENEFITS OF BREASTFEEDING For Your Baby  Your first milk (colostrum) helps your baby's digestive system function better.  There are antibodies in your milk that help your baby fight off infections.  Your baby has   a lower incidence of asthma, allergies, and sudden infant death syndrome.  The nutrients in breast milk are better for your baby than infant formulas and are designed uniquely for your baby's needs.  Breast milk improves your baby's brain development.  Your baby is less likely to develop other conditions, such as childhood obesity, asthma, or type 2 diabetes mellitus. For You  Breastfeeding helps to create a very special bond between you and your baby.  Breastfeeding is convenient. Breast milk is always available at the correct temperature and costs nothing.  Breastfeeding helps to burn calories and helps you lose the weight gained during pregnancy.  Breastfeeding makes your uterus contract to its prepregnancy size faster and slows bleeding (lochia) after you give birth.   Breastfeeding helps to lower your risk of developing type 2 diabetes mellitus, osteoporosis, and breast or ovarian cancer later in life. SIGNS THAT YOUR BABY IS HUNGRY Early Signs of Hunger  Increased alertness or activity.  Stretching.  Movement of the head from side to side.  Movement of the head and opening of the mouth when the corner of the mouth or cheek is stroked (rooting).  Increased sucking sounds, smacking lips, cooing, sighing, or squeaking.  Hand-to-mouth movements.  Increased sucking of fingers or hands. Late Signs of  Hunger  Fussing.  Intermittent crying. Extreme Signs of Hunger Signs of extreme hunger will require calming and consoling before your baby will be able to breastfeed successfully. Do not wait for the following signs of extreme hunger to occur before you initiate breastfeeding:  Restlessness.  A loud, strong cry.  Screaming. BREASTFEEDING BASICS Breastfeeding Initiation  Find a comfortable place to sit or lie down, with your neck and back well supported.  Place a pillow or rolled up blanket under your baby to bring him or her to the level of your breast (if you are seated). Nursing pillows are specially designed to help support your arms and your baby while you breastfeed.  Make sure that your baby's abdomen is facing your abdomen.  Gently massage your breast. With your fingertips, massage from your chest wall toward your nipple in a circular motion. This encourages milk flow. You may need to continue this action during the feeding if your milk flows slowly.  Support your breast with 4 fingers underneath and your thumb above your nipple. Make sure your fingers are well away from your nipple and your baby's mouth.  Stroke your baby's lips gently with your finger or nipple.  When your baby's mouth is open wide enough, quickly bring your baby to your breast, placing your entire nipple and as much of the colored area around your nipple (areola) as possible into your baby's mouth.  More areola should be visible above your baby's upper lip than below the lower lip.  Your baby's tongue should be between his or her lower gum and your breast.  Ensure that your baby's mouth is correctly positioned around your nipple (latched). Your baby's lips should create a seal on your breast and be turned out (everted).  It is common for your baby to suck about 2-3 minutes in order to start the flow of breast milk. Latching Teaching your baby how to latch on to your breast properly is very important.  An improper latch can cause nipple pain and decreased milk supply for you and poor weight gain in your baby. Also, if your baby is not latched onto your nipple properly, he or she may swallow some air during feeding. This   can make your baby fussy. Burping your baby when you switch breasts during the feeding can help to get rid of the air. However, teaching your baby to latch on properly is still the best way to prevent fussiness from swallowing air while breastfeeding. Signs that your baby has successfully latched on to your nipple:  Silent tugging or silent sucking, without causing you pain.  Swallowing heard between every 3-4 sucks.  Muscle movement above and in front of his or her ears while sucking. Signs that your baby has not successfully latched on to nipple:  Sucking sounds or smacking sounds from your baby while breastfeeding.  Nipple pain. If you think your baby has not latched on correctly, slip your finger into the corner of your baby's mouth to break the suction and place it between your baby's gums. Attempt breastfeeding initiation again. Signs of Successful Breastfeeding Signs from your baby:  A gradual decrease in the number of sucks or complete cessation of sucking.  Falling asleep.  Relaxation of his or her body.  Retention of a small amount of milk in his or her mouth.  Letting go of your breast by himself or herself. Signs from you:  Breasts that have increased in firmness, weight, and size 1-3 hours after feeding.  Breasts that are softer immediately after breastfeeding.  Increased milk volume, as well as a change in milk consistency and color by the fifth day of breastfeeding.  Nipples that are not sore, cracked, or bleeding. Signs That Your Baby is Getting Enough Milk  Wetting at least 3 diapers in a 24-hour period. The urine should be clear and pale yellow by age 5 days.  At least 3 stools in a 24-hour period by age 5 days. The stool should be soft and  yellow.  At least 3 stools in a 24-hour period by age 7 days. The stool should be seedy and yellow.  No loss of weight greater than 10% of birth weight during the first 3 days of age.  Average weight gain of 4-7 ounces (113-198 g) per week after age 4 days.  Consistent daily weight gain by age 5 days, without weight loss after the age of 2 weeks. After a feeding, your baby may spit up a small amount. This is common. BREASTFEEDING FREQUENCY AND DURATION Frequent feeding will help you make more milk and can prevent sore nipples and breast engorgement. Breastfeed when you feel the need to reduce the fullness of your breasts or when your baby shows signs of hunger. This is called "breastfeeding on demand." Avoid introducing a pacifier to your baby while you are working to establish breastfeeding (the first 4-6 weeks after your baby is born). After this time you may choose to use a pacifier. Research has shown that pacifier use during the first year of a baby's life decreases the risk of sudden infant death syndrome (SIDS). Allow your baby to feed on each breast as long as he or she wants. Breastfeed until your baby is finished feeding. When your baby unlatches or falls asleep while feeding from the first breast, offer the second breast. Because newborns are often sleepy in the first few weeks of life, you may need to awaken your baby to get him or her to feed. Breastfeeding times will vary from baby to baby. However, the following rules can serve as a guide to help you ensure that your baby is properly fed:  Newborns (babies 4 weeks of age or younger) may breastfeed every 1-3 hours.    Newborns should not go longer than 3 hours during the day or 5 hours during the night without breastfeeding.  You should breastfeed your baby a minimum of 8 times in a 24-hour period until you begin to introduce solid foods to your baby at around 6 months of age. BREAST MILK PUMPING Pumping and storing breast milk  allows you to ensure that your baby is exclusively fed your breast milk, even at times when you are unable to breastfeed. This is especially important if you are going back to work while you are still breastfeeding or when you are not able to be present during feedings. Your lactation consultant can give you guidelines on how long it is safe to store breast milk. A breast pump is a machine that allows you to pump milk from your breast into a sterile bottle. The pumped breast milk can then be stored in a refrigerator or freezer. Some breast pumps are operated by hand, while others use electricity. Ask your lactation consultant which type will work best for you. Breast pumps can be purchased, but some hospitals and breastfeeding support groups lease breast pumps on a monthly basis. A lactation consultant can teach you how to hand express breast milk, if you prefer not to use a pump. CARING FOR YOUR BREASTS WHILE YOU BREASTFEED Nipples can become dry, cracked, and sore while breastfeeding. The following recommendations can help keep your breasts moisturized and healthy:  Avoid using soap on your nipples.  Wear a supportive bra. Although not required, special nursing bras and tank tops are designed to allow access to your breasts for breastfeeding without taking off your entire bra or top. Avoid wearing underwire-style bras or extremely tight bras.  Air dry your nipples for 3-4minutes after each feeding.  Use only cotton bra pads to absorb leaked breast milk. Leaking of breast milk between feedings is normal.  Use lanolin on your nipples after breastfeeding. Lanolin helps to maintain your skin's normal moisture barrier. If you use pure lanolin, you do not need to wash it off before feeding your baby again. Pure lanolin is not toxic to your baby. You may also hand express a few drops of breast milk and gently massage that milk into your nipples and allow the milk to air dry. In the first few weeks after  giving birth, some women experience extremely full breasts (engorgement). Engorgement can make your breasts feel heavy, warm, and tender to the touch. Engorgement peaks within 3-5 days after you give birth. The following recommendations can help ease engorgement:  Completely empty your breasts while breastfeeding or pumping. You may want to start by applying warm, moist heat (in the shower or with warm water-soaked hand towels) just before feeding or pumping. This increases circulation and helps the milk flow. If your baby does not completely empty your breasts while breastfeeding, pump any extra milk after he or she is finished.  Wear a snug bra (nursing or regular) or tank top for 1-2 days to signal your body to slightly decrease milk production.  Apply ice packs to your breasts, unless this is too uncomfortable for you.  Make sure that your baby is latched on and positioned properly while breastfeeding. If engorgement persists after 48 hours of following these recommendations, contact your health care provider or a lactation consultant. OVERALL HEALTH CARE RECOMMENDATIONS WHILE BREASTFEEDING  Eat healthy foods. Alternate between meals and snacks, eating 3 of each per day. Because what you eat affects your breast milk, some of the foods   may make your baby more irritable than usual. Avoid eating these foods if you are sure that they are negatively affecting your baby.  Drink milk, fruit juice, and water to satisfy your thirst (about 10 glasses a day).  Rest often, relax, and continue to take your prenatal vitamins to prevent fatigue, stress, and anemia.  Continue breast self-awareness checks.  Avoid chewing and smoking tobacco. Chemicals from cigarettes that pass into breast milk and exposure to secondhand smoke may harm your baby.  Avoid alcohol and drug use, including marijuana. Some medicines that may be harmful to your baby can pass through breast milk. It is important to ask your health  care provider before taking any medicine, including all over-the-counter and prescription medicine as well as vitamin and herbal supplements. It is possible to become pregnant while breastfeeding. If birth control is desired, ask your health care provider about options that will be safe for your baby. SEEK MEDICAL CARE IF:  You feel like you want to stop breastfeeding or have become frustrated with breastfeeding.  You have painful breasts or nipples.  Your nipples are cracked or bleeding.  Your breasts are red, tender, or warm.  You have a swollen area on either breast.  You have a fever or chills.  You have nausea or vomiting.  You have drainage other than breast milk from your nipples.  Your breasts do not become full before feedings by the fifth day after you give birth.  You feel sad and depressed.  Your baby is too sleepy to eat well.  Your baby is having trouble sleeping.   Your baby is wetting less than 3 diapers in a 24-hour period.  Your baby has less than 3 stools in a 24-hour period.  Your baby's skin or the white part of his or her eyes becomes yellow.   Your baby is not gaining weight by 5 days of age. SEEK IMMEDIATE MEDICAL CARE IF:  Your baby is overly tired (lethargic) and does not want to wake up and feed.  Your baby develops an unexplained fever.   This information is not intended to replace advice given to you by your health care provider. Make sure you discuss any questions you have with your health care provider.   Document Released: 07/13/2005 Document Revised: 04/03/2015 Document Reviewed: 01/04/2013 Elsevier Interactive Patient Education 2016 [employer]  

## 2016-01-10 LAB — GC/CHLAMYDIA PROBE AMP (~~LOC~~) NOT AT ARMC
CHLAMYDIA, DNA PROBE: NEGATIVE
Neisseria Gonorrhea: NEGATIVE

## 2016-01-10 LAB — CYTOLOGY - PAP

## 2016-01-11 LAB — GLUCOSE TOLERANCE, 1 HOUR (50G) W/O FASTING: Glucose, 1 Hr, gestational: 144 mg/dL — ABNORMAL HIGH (ref <140)

## 2016-01-14 ENCOUNTER — Telehealth: Payer: Self-pay | Admitting: *Deleted

## 2016-01-14 DIAGNOSIS — B3731 Acute candidiasis of vulva and vagina: Secondary | ICD-10-CM

## 2016-01-14 DIAGNOSIS — B373 Candidiasis of vulva and vagina: Secondary | ICD-10-CM

## 2016-01-14 NOTE — Telephone Encounter (Signed)
Per messages from Dr. Mendel RyderPratt-need to notify patient pap was negative but did show yeast- if patient has symptoms may be treated with terconazole. Also had abnormal 1 hour gtt and needs 3 hr gtt scheduled.   I called patient and left a message we are calling with some information- please call clinic.

## 2016-01-15 ENCOUNTER — Encounter (HOSPITAL_COMMUNITY): Payer: Self-pay | Admitting: Family Medicine

## 2016-01-17 MED ORDER — TERCONAZOLE 0.4 % VA CREA: 1.0000 | TOPICAL_CREAM | Freq: Every day | VAGINAL | Status: DC

## 2016-01-17 NOTE — Telephone Encounter (Signed)
Called [name] and she is aware of failing 1 hr gtt and aware of appt for 3 hr gtt and instructions. Does want something for yeast- sent in for terconazole. She voices understanding.

## 2016-01-22 ENCOUNTER — Ambulatory Visit (HOSPITAL_COMMUNITY)
Admission: RE | Admit: 2016-01-22 | Discharge: 2016-01-22 | Disposition: A | Discharge status: Home / Self Care | Payer: BLUE CROSS/BLUE SHIELD | Source: Ambulatory Visit | Attending: Family Medicine | Admitting: Family Medicine

## 2016-01-22 ENCOUNTER — Other Ambulatory Visit: Payer: BLUE CROSS/BLUE SHIELD

## 2016-01-22 ENCOUNTER — Ambulatory Visit (HOSPITAL_COMMUNITY): Payer: BLUE CROSS/BLUE SHIELD

## 2016-01-22 ENCOUNTER — Encounter (HOSPITAL_COMMUNITY): Payer: Self-pay

## 2016-01-22 VITALS — BP 116/69 | HR 74 | Wt 202.8 lb

## 2016-01-22 DIAGNOSIS — O09521 Supervision of elderly multigravida, first trimester: Secondary | ICD-10-CM | POA: Diagnosis not present

## 2016-01-22 DIAGNOSIS — Z3A13 13 weeks gestation of pregnancy: Secondary | ICD-10-CM | POA: Insufficient documentation

## 2016-01-22 DIAGNOSIS — Z36 Encounter for antenatal screening of mother: Secondary | ICD-10-CM | POA: Diagnosis not present

## 2016-01-22 DIAGNOSIS — O10011 Pre-existing essential hypertension complicating pregnancy, first trimester: Secondary | ICD-10-CM | POA: Insufficient documentation

## 2016-01-22 DIAGNOSIS — O09529 Supervision of elderly multigravida, unspecified trimester: Secondary | ICD-10-CM | POA: Insufficient documentation

## 2016-01-22 NOTE — Progress Notes (Signed)
Genetic Counseling  High-Risk Gestation Note  Appointment Date:  01/22/2016 Referred By: Reva BoresPratt, Tanya S, MD Date of Birth:  Aug 05, 1975  Pregnancy History: G2P0010 Estimated Date of Delivery: 07/24/16 Estimated Gestational Age: 5364w5d Attending: Particia Nearingecker, Martha, MD   Ms. Suzanne [name] was seen for genetic counseling because of a maternal age of 40. Suzanne [name]'s mother also attended the genetic counseling appointment today.     In summary:  Discussed AMA and associated risk for fetal aneuploidy  Discussed options for screening  First screen-declined  Quad screen-declined  NIPS-declined  Ultrasound-NT performed today; anatomy scheduled  Discussed diagnostic testing options  CVS-declined  Amniocentesis-declined  Reviewed family history concerns  Discussed carrier screening options-declined  She was counseled regarding maternal age and the association with risk for chromosome conditions due to nondisjunction with aging of the ova.  We reviewed chromosomes, nondisjunction, and the associated 1 in 9444 risk for fetal aneuploidy related to a maternal age of 40 y.o. at 1864w5d gestation.  She was counseled that the risk for aneuploidy decreases as gestational age increases, accounting for those pregnancies which spontaneously abort.  We specifically discussed Down syndrome (trisomy 1121), trisomies 5213 and 6918, and sex chromosome aneuploidies (47,XXX and 47,XXY) including the common features and prognoses of each.   We reviewed available screening options including First Screen, Quad screen, noninvasive prenatal screening (NIPS)/cell free DNA (cfDNA) screening, and ultrasound.  She was counseled that screening tests are used to modify a patient's a priori risk for aneuploidy, typically based on age. This estimate provides a pregnancy specific risk assessment. We reviewed the benefits and limitations of each option. Specifically, we discussed the conditions for which each test screens, the  detection rates, and false positive rates of each. She was also counseled regarding diagnostic testing via CVS and amniocentesis. We reviewed the associated risks for complications, including spontaneous pregnancy loss. We discussed the possible results that the tests might provide including: positive, negative, unanticipated, and no result. Finally, she was counseled regarding the cost of each option and potential out of pocket expenses. After consideration of all the options, Suzanne [name] expressed interest in having only ultrasound. She declined other screening and diagnostic testing options.     Suzanne [name] had a nuchal translucency ultrasound today. The report will be documented separately.  The patient would like to return for a detailed ultrasound at ~18+ weeks gestation.  This appointment was scheduled today. She understands that screening tests cannot rule out all birth defects or genetic syndromes. The patient was advised of this limitation and states she still does not want additional testing at this time.    Suzanne [name] was provided with written information regarding cystic fibrosis (CF), spinal muscular atrophy (SMA) and hemoglobinopathies including the carrier frequency, availability of carrier screening and prenatal diagnosis if indicated.  In addition, we discussed that CF and hemoglobinopathies are routinely screened for as part of the [place] newborn screening panel.  After further discussion, she declined screening for CF, SMA and hemoglobinopathies.  Both family histories were reviewed and found to be noncontributory for birth defects, intellectual disability, and known genetic conditions. Suzanne [name] reported that her brother died suddenly at age 40 from congestive heart failure. By report, his autopsy showed heart failure, but a more specific cause of death was not determined. We reviewed that there are genetic causes for heart failure, including cardiac conduction differences,  cardiomyopathy, congenital heart defects (untreated). This relative had no history of heart disease or other medical concerns. Without further information regarding  the provided family history, an accurate genetic risk cannot be calculated. Further genetic counseling is warranted if more information is obtained.  Ms. denied exposure to environmental toxins or chemical agents. She denied the use of alcohol and street drugs. She reported that she smoked tobacco during the first 6 weeks of pregnancy, but quit at that time. We discussed the implications and counseled regarding continued cessation. She denied significant viral illnesses during the course of her pregnancy. Her medical and surgical histories were contributory for [facility].   I counseled Suzanne [name] and her mother regarding the above risks and available options.  The approximate face-to-face time with the genetic counselor was 58 minutes.  Janit PaganLauren Loffredo, [employer] Genetic Counseling intern, provided >50% of the genetic counseling services today, under my direct supervision.   Donald Prosehristy S. Harlem Bula, MS  Certified Genetic Counselor

## 2016-01-29 ENCOUNTER — Other Ambulatory Visit: Payer: Self-pay

## 2016-02-02 IMAGING — CR DG CHEST 2V
2 series · 2 of 2 positions shown · non-contrast
Comparison: 10/10/2012

CLINICAL DATA: Chest pain, left-sided.

EXAM:
CHEST  2 VIEW

[w chest pa]
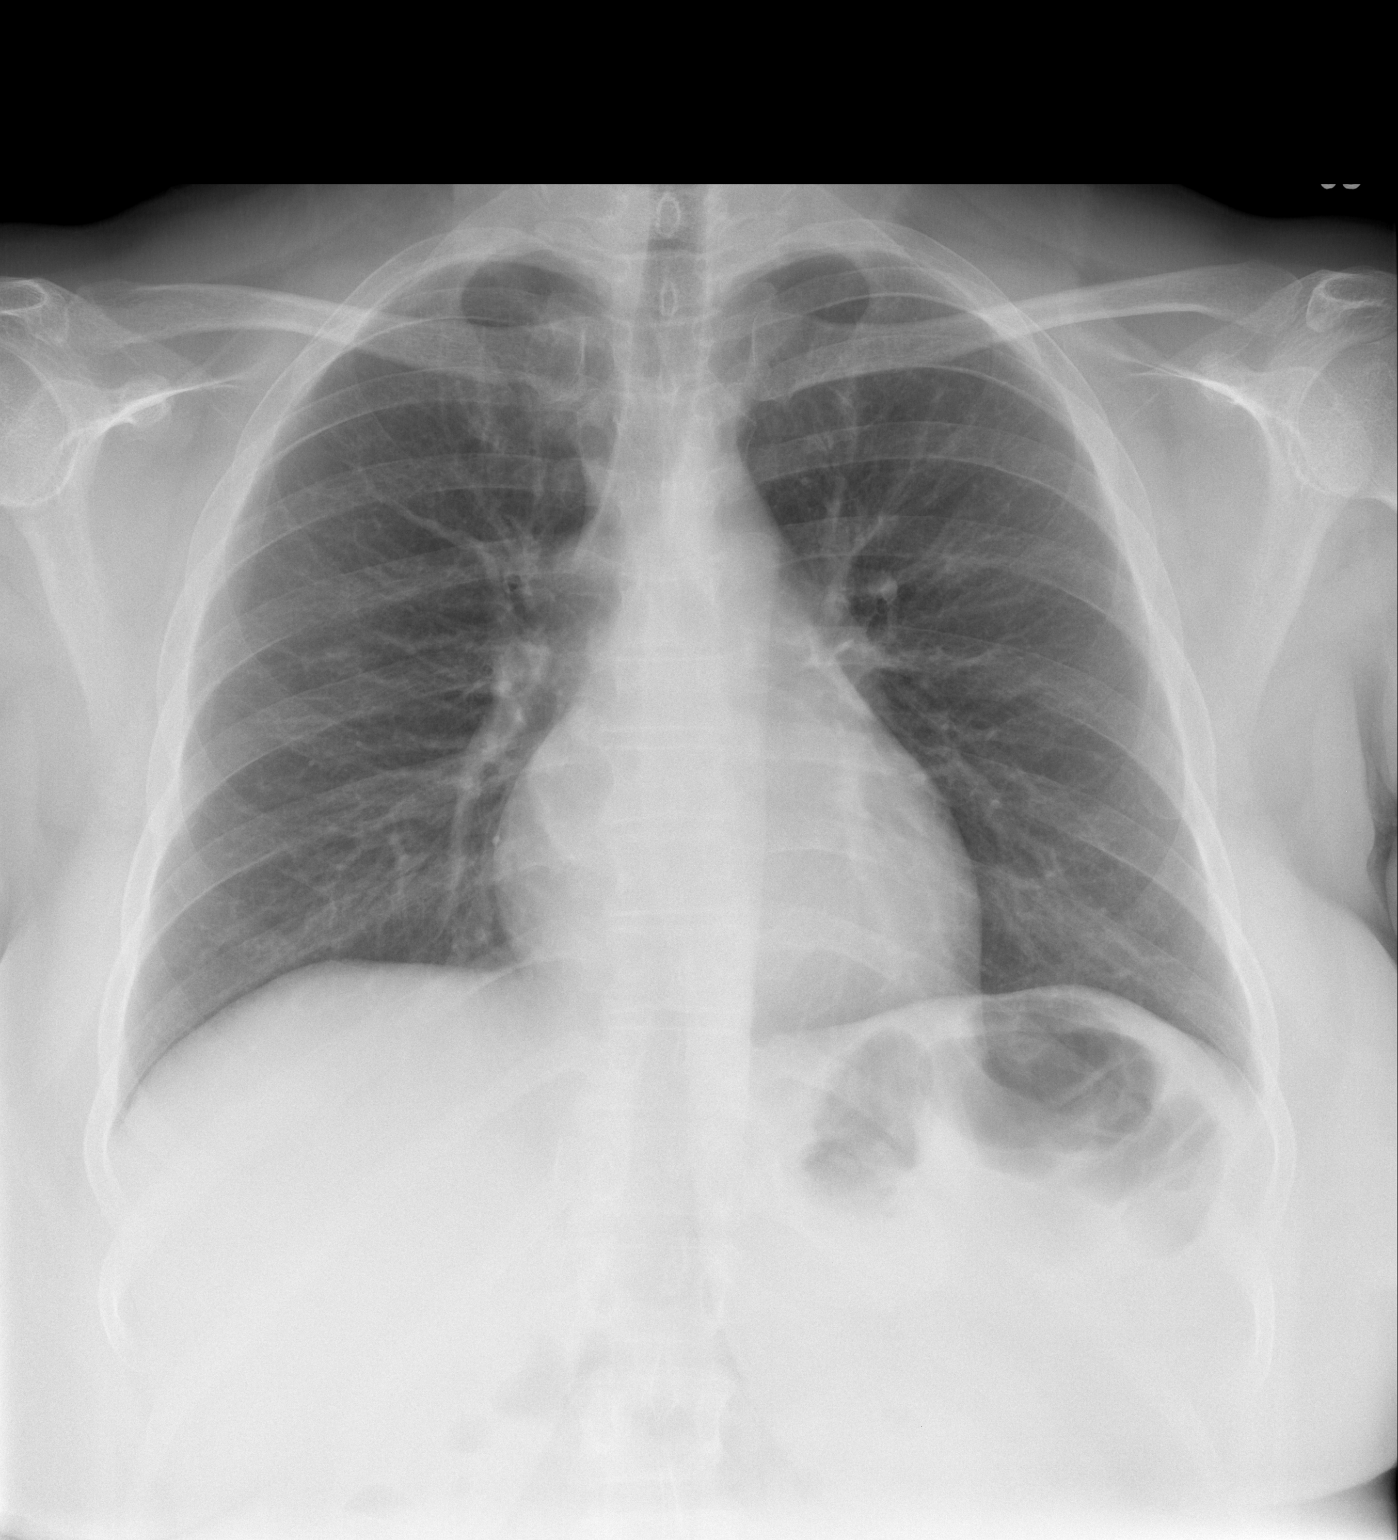

[w chest lat *]
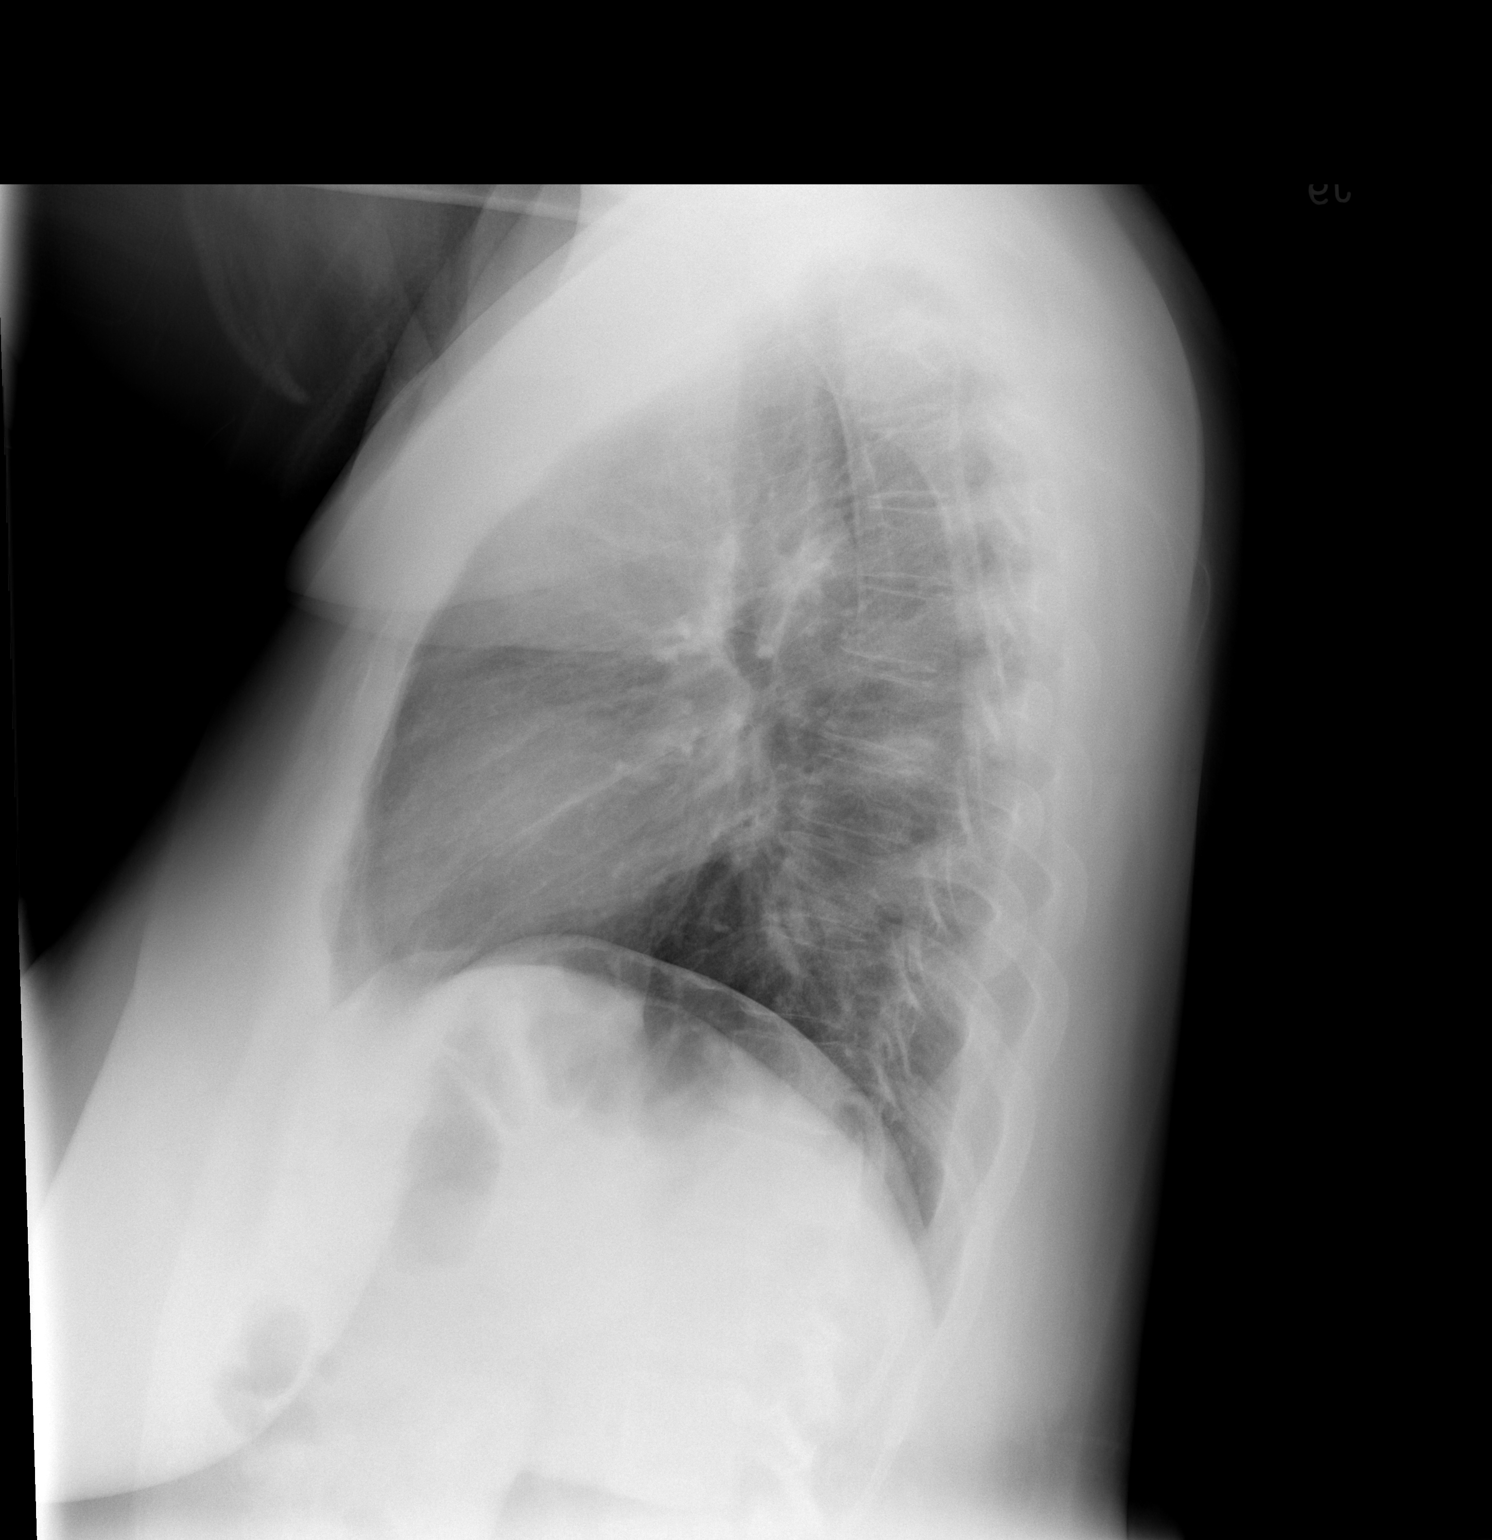

[2 of 2 positions shown; findings below may reference images not displayed]

FINDINGS: Normal heart size and mediastinal contours. No acute infiltrate or
edema. No effusion or pneumothorax. No acute osseous findings.
IMPRESSION: No active cardiopulmonary disease.

## 2016-02-06 ENCOUNTER — Ambulatory Visit (INDEPENDENT_AMBULATORY_CARE_PROVIDER_SITE_OTHER): Payer: BLUE CROSS/BLUE SHIELD | Admitting: Advanced Practice Midwife

## 2016-02-06 VITALS — BP 129/80 | HR 77 | Wt 201.8 lb

## 2016-02-06 DIAGNOSIS — O10912 Unspecified pre-existing hypertension complicating pregnancy, second trimester: Secondary | ICD-10-CM

## 2016-02-06 DIAGNOSIS — R7302 Impaired glucose tolerance (oral): Secondary | ICD-10-CM

## 2016-02-06 DIAGNOSIS — R7309 Other abnormal glucose: Secondary | ICD-10-CM

## 2016-02-06 DIAGNOSIS — O09522 Supervision of elderly multigravida, second trimester: Secondary | ICD-10-CM

## 2016-02-06 DIAGNOSIS — O0992 Supervision of high risk pregnancy, unspecified, second trimester: Secondary | ICD-10-CM

## 2016-02-06 LAB — POCT URINALYSIS DIP (DEVICE)
BILIRUBIN URINE: NEGATIVE
GLUCOSE, UA: NEGATIVE mg/dL
KETONES UR: NEGATIVE mg/dL
Leukocytes, UA: NEGATIVE
NITRITE: NEGATIVE
PH: 5.5 (ref 5.0–8.0)
Protein, ur: 30 mg/dL — AB
Specific Gravity, Urine: >=1.03 (ref 1.005–1.030)
Urobilinogen, UA: 1 mg/dL (ref 0.0–1.0)

## 2016-02-06 NOTE — Patient Instructions (Signed)

## 2016-02-06 NOTE — Progress Notes (Signed)
Subjective:  Suzanne [name] is a 40 y.o. G2P0010 at 10103w6d being seen today for ongoing prenatal care.  She is currently monitored for the following issues for this high-risk pregnancy and has Supervision of high risk pregnancy, antepartum; AMA (advanced maternal age) multigravida 8640+; GERD (gastroesophageal reflux disease); Hypertension, benign; and Hypertension in pregnancy, essential, antepartum on her problem list.  Patient reports no complaints.  Contractions: Not present. Vag. Bleeding: None.   . Denies leaking of fluid.   The following portions of the patient's history were reviewed and updated as appropriate: allergies, current medications, past family history, past medical history, past social history, past surgical history and problem list. Problem list updated.  Objective:   Filed Vitals:   02/06/16 0847  BP: 129/80  Pulse: 77  Weight: 201 lb 12.8 oz (91.536 kg)    Fetal Status: Fetal Heart Rate (bpm): 150 Fundal Height: 18 cm Movement: Absent     General:  Alert, oriented and cooperative. Patient is in no acute distress.  Skin: Skin is warm and dry. No rash noted.   Cardiovascular: Normal heart rate noted  Respiratory: Normal respiratory effort, no problems with respiration noted  Abdomen: Soft, gravid, appropriate for gestational age. Pain/Pressure: Present     Pelvic:  Cervical exam declined        Extremities: Normal range of motion.  Edema: None  Mental Status: Normal mood and affect. Normal behavior. Normal judgment and thought content.   Urinalysis: Urine Protein: 1+ Urine Glucose: Negative  Assessment and Plan:  Pregnancy: G2P0010 at 41103w6d  1. Chronic hypertension in pregnancy, second trimester  - Baseline Protein / Creatinine Ratio, Urine  2. AMA (advanced maternal age) multigravida 35+, second trimester   3. Supervision of high risk pregnancy, antepartum, second trimester   Preterm labor symptoms and general obstetric precautions including but not  limited to vaginal bleeding, contractions, leaking of fluid and fetal movement were reviewed in detail with the patient. Please refer to After Visit Summary for other counseling recommendations.  Return in 4 days (on 02/10/2016).   Suzanne [name], CNM

## 2016-02-06 NOTE — Progress Notes (Signed)
Urine: small amt hgb Pt brought wrong jelly beans for 3hr gtt

## 2016-02-10 ENCOUNTER — Other Ambulatory Visit: Payer: Self-pay

## 2016-02-24 ENCOUNTER — Encounter (HOSPITAL_COMMUNITY): Payer: Self-pay

## 2016-02-25 ENCOUNTER — Ambulatory Visit (HOSPITAL_COMMUNITY)
Admission: RE | Admit: 2016-02-25 | Discharge: 2016-02-25 | Disposition: A | Discharge status: Home / Self Care | Payer: BLUE CROSS/BLUE SHIELD | Source: Ambulatory Visit | Attending: Family Medicine | Admitting: Family Medicine

## 2016-02-25 ENCOUNTER — Encounter (HOSPITAL_COMMUNITY): Payer: Self-pay

## 2016-02-25 VITALS — BP 130/86 | HR 80 | Wt 209.0 lb

## 2016-02-25 DIAGNOSIS — O09512 Supervision of elderly primigravida, second trimester: Secondary | ICD-10-CM | POA: Diagnosis not present

## 2016-02-25 DIAGNOSIS — Z36 Encounter for antenatal screening of mother: Secondary | ICD-10-CM | POA: Insufficient documentation

## 2016-02-25 DIAGNOSIS — Z3A18 18 weeks gestation of pregnancy: Secondary | ICD-10-CM | POA: Insufficient documentation

## 2016-02-25 DIAGNOSIS — O09529 Supervision of elderly multigravida, unspecified trimester: Secondary | ICD-10-CM

## 2016-03-19 ENCOUNTER — Ambulatory Visit (INDEPENDENT_AMBULATORY_CARE_PROVIDER_SITE_OTHER): Payer: BLUE CROSS/BLUE SHIELD | Admitting: Family Medicine

## 2016-03-19 VITALS — BP 129/85 | HR 84 | Wt 205.2 lb

## 2016-03-19 DIAGNOSIS — O0992 Supervision of high risk pregnancy, unspecified, second trimester: Secondary | ICD-10-CM

## 2016-03-19 DIAGNOSIS — O10012 Pre-existing essential hypertension complicating pregnancy, second trimester: Secondary | ICD-10-CM

## 2016-03-19 DIAGNOSIS — O09522 Supervision of elderly multigravida, second trimester: Secondary | ICD-10-CM

## 2016-03-19 DIAGNOSIS — O09529 Supervision of elderly multigravida, unspecified trimester: Secondary | ICD-10-CM

## 2016-03-19 DIAGNOSIS — O10011 Pre-existing essential hypertension complicating pregnancy, first trimester: Secondary | ICD-10-CM

## 2016-03-19 LAB — POCT URINALYSIS DIP (DEVICE)
BILIRUBIN URINE: NEGATIVE
Glucose, UA: NEGATIVE mg/dL
KETONES UR: NEGATIVE mg/dL
LEUKOCYTES UA: NEGATIVE
Nitrite: NEGATIVE
Protein, ur: NEGATIVE mg/dL
SPECIFIC GRAVITY, URINE: 1.02 (ref 1.005–1.030)
Urobilinogen, UA: 0.2 mg/dL (ref 0.0–1.0)
pH: 7 (ref 5.0–8.0)

## 2016-03-19 LAB — GLUCOSE, CAPILLARY: Glucose-Capillary: 80 mg/dL (ref 65–99)

## 2016-03-19 NOTE — Progress Notes (Signed)
C/o still having nausea . States couldn't find starburst jelly beans to do 3 h r gtt . States can't tolerate glucola.

## 2016-03-19 NOTE — Progress Notes (Addendum)
Subjective:  Suzanne [name] is a 40 y.o. G2P0010 at 7426w6d being seen today for ongoing prenatal care.  She is currently monitored for the following issues for this high-risk pregnancy and has Supervision of high risk pregnancy, antepartum; AMA (advanced maternal age) multigravida 6440+; GERD (gastroesophageal reflux disease); Hypertension, benign; and Hypertension in pregnancy, essential, antepartum on her problem list.  Patient reports no complaints.  Contractions: Irregular. Vag. Bleeding: None.  Movement: Present. Denies leaking of fluid.   The following portions of the patient's history were reviewed and updated as appropriate: allergies, current medications, past family history, past medical history, past social history, past surgical history and problem list. Problem list updated.  Objective:   Vitals:   03/19/16 0956  BP: 129/85  Pulse: 84  Weight: 205 lb 3.2 oz (93.1 kg)    Fetal Status: Fetal Heart Rate (bpm): 150   Movement: Present     General:  Alert, oriented and cooperative. Patient is in no acute distress.  Skin: Skin is warm and dry. No rash noted.   Cardiovascular: Normal heart rate noted  Respiratory: Normal respiratory effort, no problems with respiration noted  Abdomen: Soft, gravid, appropriate for gestational age. Pain/Pressure: Present     Pelvic:  Cervical exam deferred        Extremities: Normal range of motion.  Edema: Trace  Mental Status: Normal mood and affect. Normal behavior. Normal judgment and thought content.   Urinalysis:      Assessment and Plan:  Pregnancy: G2P0010 at 2326w6d  1. Supervision of high risk pregnancy, antepartum, second trimester FH normal.  Needs to bring in jelly beans for 3 hr GTT - will schedule for Monday.    2. Hypertension in pregnancy, essential, antepartum, first trimester Repeat US next week.  Needs 24 hour urine - supplies given. Bring in Monday.  3. AMA (advanced maternal age) multigravida 35+, unspecified  trimester   Preterm labor symptoms and general obstetric precautions including but not limited to vaginal bleeding, contractions, leaking of fluid and fetal movement were reviewed in detail with the patient. Please refer to After Visit Summary for other counseling recommendations.  No Follow-up on file.   Levie HeritageJacob J Bethzaida Boord, DO

## 2016-03-19 NOTE — Progress Notes (Signed)
Fasting cbg today =80.

## 2016-03-24 ENCOUNTER — Other Ambulatory Visit: Payer: Self-pay

## 2016-03-24 ENCOUNTER — Ambulatory Visit (HOSPITAL_COMMUNITY)
Admission: RE | Admit: 2016-03-24 | Discharge: 2016-03-24 | Disposition: A | Discharge status: Home / Self Care | Payer: BLUE CROSS/BLUE SHIELD | Source: Ambulatory Visit | Attending: Family Medicine | Admitting: Family Medicine

## 2016-03-24 ENCOUNTER — Encounter (HOSPITAL_COMMUNITY): Payer: Self-pay

## 2016-03-24 ENCOUNTER — Other Ambulatory Visit (HOSPITAL_COMMUNITY): Payer: Self-pay | Admitting: Maternal and Fetal Medicine

## 2016-03-24 VITALS — BP 148/94 | HR 77 | Wt 210.4 lb

## 2016-03-24 DIAGNOSIS — O09529 Supervision of elderly multigravida, unspecified trimester: Secondary | ICD-10-CM

## 2016-03-24 DIAGNOSIS — O09522 Supervision of elderly multigravida, second trimester: Secondary | ICD-10-CM | POA: Diagnosis not present

## 2016-03-24 DIAGNOSIS — O10019 Pre-existing essential hypertension complicating pregnancy, unspecified trimester: Secondary | ICD-10-CM

## 2016-03-24 DIAGNOSIS — Z3A22 22 weeks gestation of pregnancy: Secondary | ICD-10-CM

## 2016-03-24 DIAGNOSIS — Z0489 Encounter for examination and observation for other specified reasons: Secondary | ICD-10-CM

## 2016-03-24 DIAGNOSIS — O162 Unspecified maternal hypertension, second trimester: Secondary | ICD-10-CM | POA: Insufficient documentation

## 2016-03-24 DIAGNOSIS — Z36 Encounter for antenatal screening of mother: Secondary | ICD-10-CM | POA: Diagnosis not present

## 2016-03-24 DIAGNOSIS — IMO0002 Reserved for concepts with insufficient information to code with codable children: Secondary | ICD-10-CM

## 2016-03-25 ENCOUNTER — Other Ambulatory Visit: Payer: Self-pay

## 2016-03-27 ENCOUNTER — Other Ambulatory Visit: Payer: BLUE CROSS/BLUE SHIELD

## 2016-03-27 DIAGNOSIS — R7309 Other abnormal glucose: Secondary | ICD-10-CM

## 2016-03-27 DIAGNOSIS — O169 Unspecified maternal hypertension, unspecified trimester: Secondary | ICD-10-CM

## 2016-03-27 LAB — COMPREHENSIVE METABOLIC PANEL
ALK PHOS: 79 U/L (ref 33–115)
ALT: 8 U/L (ref 6–29)
AST: 15 U/L (ref 10–30)
Albumin: 3.3 g/dL — ABNORMAL LOW (ref 3.6–5.1)
BILIRUBIN TOTAL: 0.3 mg/dL (ref 0.2–1.2)
BUN: 7 mg/dL (ref 7–25)
CALCIUM: 8.6 mg/dL (ref 8.6–10.2)
CO2: 21 mmol/L (ref 20–31)
Chloride: 107 mmol/L (ref 98–110)
Creat: 0.71 mg/dL (ref 0.50–1.10)
Glucose, Bld: 84 mg/dL (ref 65–99)
POTASSIUM: 3.3 mmol/L — AB (ref 3.5–5.3)
Sodium: 136 mmol/L (ref 135–146)
TOTAL PROTEIN: 6.3 g/dL (ref 6.1–8.1)

## 2016-03-28 LAB — PROTEIN, URINE, 24 HOUR
Protein, 24H Urine: 125 mg/24 h (ref <150)
Protein, Urine: 25 mg/dL — ABNORMAL HIGH (ref 5–24)

## 2016-03-31 ENCOUNTER — Other Ambulatory Visit: Payer: Self-pay

## 2016-04-01 ENCOUNTER — Other Ambulatory Visit: Payer: Self-pay

## 2016-04-03 ENCOUNTER — Other Ambulatory Visit: Payer: BLUE CROSS/BLUE SHIELD

## 2016-04-03 DIAGNOSIS — R7309 Other abnormal glucose: Secondary | ICD-10-CM

## 2016-04-03 LAB — GLUCOSE TOLERANCE, 3 HOURS
GLUCOSE 3 HOUR GTT: 80 mg/dL (ref <145)
GLUCOSE, FASTING-GESTATIONAL: 74 mg/dL (ref 65–104)
Glucose Tolerance, 1 hour: 161 mg/dL (ref <190)
Glucose Tolerance, 2 hour: 97 mg/dL (ref <165)

## 2016-04-16 ENCOUNTER — Encounter: Payer: Self-pay | Admitting: Obstetrics and Gynecology

## 2016-04-17 ENCOUNTER — Ambulatory Visit (INDEPENDENT_AMBULATORY_CARE_PROVIDER_SITE_OTHER): Payer: BLUE CROSS/BLUE SHIELD | Admitting: Family Medicine

## 2016-04-17 VITALS — BP 147/84 | HR 77 | Wt 208.2 lb

## 2016-04-17 DIAGNOSIS — O10012 Pre-existing essential hypertension complicating pregnancy, second trimester: Secondary | ICD-10-CM

## 2016-04-17 DIAGNOSIS — Z23 Encounter for immunization: Secondary | ICD-10-CM | POA: Diagnosis not present

## 2016-04-17 DIAGNOSIS — O0992 Supervision of high risk pregnancy, unspecified, second trimester: Secondary | ICD-10-CM

## 2016-04-17 DIAGNOSIS — K219 Gastro-esophageal reflux disease without esophagitis: Secondary | ICD-10-CM

## 2016-04-17 LAB — CBC
HCT: 33.9 % — ABNORMAL LOW (ref 35.0–45.0)
Hemoglobin: 11.1 g/dL — ABNORMAL LOW (ref 11.7–15.5)
MCH: 33.6 pg — AB (ref 27.0–33.0)
MCHC: 32.7 g/dL (ref 32.0–36.0)
MCV: 102.7 fL — AB (ref 80.0–100.0)
MPV: 9.7 fL (ref 7.5–12.5)
PLATELETS: 220 10*3/uL (ref 140–400)
RBC: 3.3 MIL/uL — AB (ref 3.80–5.10)
RDW: 12.9 % (ref 11.0–15.0)
WBC: 8.6 10*3/uL (ref 3.8–10.8)

## 2016-04-17 LAB — POCT URINALYSIS DIP (DEVICE)
BILIRUBIN URINE: NEGATIVE
Glucose, UA: NEGATIVE mg/dL
KETONES UR: NEGATIVE mg/dL
Leukocytes, UA: NEGATIVE
Nitrite: NEGATIVE
PH: 6 (ref 5.0–8.0)
PROTEIN: NEGATIVE mg/dL
SPECIFIC GRAVITY, URINE: 1.015 (ref 1.005–1.030)
Urobilinogen, UA: 0.2 mg/dL (ref 0.0–1.0)

## 2016-04-17 MED ORDER — TETANUS-DIPHTH-ACELL PERTUSSIS 5-2.5-18.5 LF-MCG/0.5 IM SUSP
0.5000 mL | Freq: Once | INTRAMUSCULAR | Status: AC
  Administered 2016-04-17: 0.5 mL via INTRAMUSCULAR

## 2016-04-17 NOTE — Progress Notes (Signed)
   PRENATAL VISIT NOTE  Subjective:  Mariea StableJamila Arbogast is a 40 y.o. G2P0010 at 605w0d being seen today for ongoing prenatal care.  She is currently monitored for the following issues for this high-risk pregnancy and has Supervision of high risk pregnancy, antepartum; AMA (advanced maternal age) multigravida 7640+; GERD (gastroesophageal reflux disease); Hypertension, benign; and Hypertension in pregnancy, essential, antepartum on her problem list.  Patient reports no complaints.  Contractions: Irritability. Vag. Bleeding: None.  Movement: Present. Denies leaking of fluid.   The following portions of the patient's history were reviewed and updated as appropriate: allergies, current medications, past family history, past medical history, past social history, past surgical history and problem list. Problem list updated.  Objective:   Vitals:   04/17/16 0814 04/17/16 0849  BP: (!) 158/95 (!) 147/84  Pulse: 77   Weight: 208 lb 3.2 oz (94.4 kg)     Fetal Status: Fetal Heart Rate (bpm): 147 Fundal Height: 27 cm Movement: Present     General:  Alert, oriented and cooperative. Patient is in no acute distress.  Skin: Skin is warm and dry. No rash noted.   Cardiovascular: Normal heart rate noted  Respiratory: Normal respiratory effort, no problems with respiration noted  Abdomen: Soft, gravid, appropriate for gestational age. Pain/Pressure: Present     Pelvic:  Cervical exam deferred        Extremities: Normal range of motion.  Edema: Trace  Mental Status: Normal mood and affect. Normal behavior. Normal judgment and thought content.   Urinalysis:      Assessment and Plan:  Pregnancy: G2P0010 at 325w0d  1. Pregnancy, supervision, high-risk, second trimester FHT and FH normal.  Will get 28 week labs.  3hr GTT done at 24 weeks, which is acceptable for screening - no need to repeat. - RPR - CBC - HIV antibody (with reflex) - Prenatal Profile  2. Hypertension in pregnancy, essential, antepartum,  second trimester A little elevated today - took labetalol less than 1hr ago.  Will check urine.  Continue US q 3-4 weeks.  3. Gastroesophageal reflux disease without esophagitis Continue prilosec  Preterm labor symptoms and general obstetric precautions including but not limited to vaginal bleeding, contractions, leaking of fluid and fetal movement were reviewed in detail with the patient. Please refer to After Visit Summary for other counseling recommendations.  Return in about 4 weeks (around 05/15/2016) for HR OB f/u.  Levie HeritageJacob J Stinson, DO

## 2016-04-17 NOTE — Progress Notes (Signed)
tdap vaccine today 

## 2016-04-17 NOTE — Patient Instructions (Signed)
AREA PEDIATRIC/FAMILY PRACTICE PHYSICIANS  [facility] [address], [address] [place], [place]  [address] Phone - [phone]   Fax - [phone]  [facility] [address] [place], [place] [address] Phone - [phone]   Fax - [phone]  [facility] [address] [place], [place]  [address] Phone - [phone]   Fax - [phone]  [facility] [address] [place], [place]  [address] Phone - [phone]   Fax - [phone]  [facility] [address] [place], [place]  [address] Phone - [phone]   Fax - [phone]  [facility] [address] [place], [place]  [address] Phone - [phone]   Fax - [phone]  [facility] [address] [place], [place]  [address] Phone - [phone]   Fax - [phone]  [facility] [address] [place], [place]  [address] Phone - [phone]   Fax - [phone]  [facility] [address] [place], [place]  [address] Phone - [phone]   Fax - [phone] [facility] [address] [place], [place]  [address] Phone - [phone]   Fax - [phone]  [facility] [address] [place], [place]  [address] Phone - [phone]   Fax - [phone]  [facility] [address] [place], [place]  [address] Phone - [phone]   Fax - [phone]  [facility] [address] [place], [place]  [address] Phone - [phone]   Fax - [phone]  [facility] [address] [place], [place]  [address] Phone - [phone]  [facility] [address] [place], [place]  [address] Phone - [phone]   Fax - [phone]  [facility] [address] [place], [place]  [address] Phone - [phone]   Fax - [phone]  [facility] [address] [place], [place]  [address] Phone - [phone]   Fax - [phone]  [facility] [address] [place], [place]  [address] Phone - [phone]   Fax - [phone]   [facility] [address] [place], [place]  [address] Phone - [phone]   Fax - [phone]  [facility] [address] [place], [place]  [address] Phone - [phone]   Fax - [phone]  [facility] [address] [place], [place]  [address] Phone - [phone]   Fax - [phone]  [facility] [address] , [place]  [address] Phone - [phone]   Fax - [phone]  [facility] [address] , [place]  [address] Phone - [phone]   Fax - [phone] [facility] [address] [place], [place]  [address] Phone - [phone]   Fax - [phone]  [facility] [address] [place], [place]  [address] Phone - [phone]   Fax - [phone]  [facility] [address] [place],   [address] Phone - [phone]   Fax - [phone]   

## 2016-04-20 LAB — PRENATAL PROFILE (SOLSTAS)
ANTIBODY SCREEN: NEGATIVE
BASOS ABS: 0 {cells}/uL (ref 0–200)
BASOS PCT: 0 %
Eosinophils Absolute: 86 cells/uL (ref 15–500)
Eosinophils Relative: 1 %
HEMATOCRIT: 33.9 % — AB (ref 35.0–45.0)
HEMOGLOBIN: 11.1 g/dL — AB (ref 11.7–15.5)
HIV 1&2 Ab, 4th Generation: NONREACTIVE
Hepatitis B Surface Ag: NEGATIVE
LYMPHS ABS: 3096 {cells}/uL (ref 850–3900)
Lymphocytes Relative: 36 %
MCH: 33.6 pg — AB (ref 27.0–33.0)
MCHC: 32.7 g/dL (ref 32.0–36.0)
MCV: 102.7 fL — AB (ref 80.0–100.0)
MONO ABS: 602 {cells}/uL (ref 200–950)
MONOS PCT: 7 %
MPV: 9.7 fL (ref 7.5–12.5)
NEUTROS PCT: 56 %
Neutro Abs: 4816 cells/uL (ref 1500–7800)
Platelets: 220 10*3/uL (ref 140–400)
RBC: 3.3 MIL/uL — AB (ref 3.80–5.10)
RDW: 12.9 % (ref 11.0–15.0)
Rh Type: POSITIVE
Rubella: 3.52 Index — ABNORMAL HIGH (ref <0.90)
WBC: 8.6 10*3/uL (ref 3.8–10.8)

## 2016-04-21 ENCOUNTER — Other Ambulatory Visit (HOSPITAL_COMMUNITY): Payer: Self-pay | Admitting: Maternal and Fetal Medicine

## 2016-04-21 ENCOUNTER — Encounter (HOSPITAL_COMMUNITY): Payer: Self-pay

## 2016-04-21 ENCOUNTER — Ambulatory Visit (HOSPITAL_COMMUNITY)
Admission: RE | Admit: 2016-04-21 | Discharge: 2016-04-21 | Disposition: A | Discharge status: Home / Self Care | Payer: BLUE CROSS/BLUE SHIELD | Source: Ambulatory Visit | Attending: Family Medicine | Admitting: Family Medicine

## 2016-04-21 DIAGNOSIS — O10012 Pre-existing essential hypertension complicating pregnancy, second trimester: Secondary | ICD-10-CM | POA: Insufficient documentation

## 2016-04-21 DIAGNOSIS — O09512 Supervision of elderly primigravida, second trimester: Secondary | ICD-10-CM | POA: Diagnosis not present

## 2016-04-21 DIAGNOSIS — Z3A26 26 weeks gestation of pregnancy: Secondary | ICD-10-CM | POA: Diagnosis not present

## 2016-04-21 DIAGNOSIS — O10019 Pre-existing essential hypertension complicating pregnancy, unspecified trimester: Secondary | ICD-10-CM

## 2016-04-21 DIAGNOSIS — O162 Unspecified maternal hypertension, second trimester: Secondary | ICD-10-CM

## 2016-04-21 DIAGNOSIS — O365920 Maternal care for other known or suspected poor fetal growth, second trimester, not applicable or unspecified: Secondary | ICD-10-CM

## 2016-04-27 ENCOUNTER — Other Ambulatory Visit (HOSPITAL_COMMUNITY): Payer: Self-pay | Admitting: Maternal and Fetal Medicine

## 2016-04-27 ENCOUNTER — Other Ambulatory Visit (HOSPITAL_COMMUNITY): Payer: Self-pay | Admitting: *Deleted

## 2016-04-27 ENCOUNTER — Ambulatory Visit (HOSPITAL_COMMUNITY)
Admission: RE | Admit: 2016-04-27 | Discharge: 2016-04-27 | Disposition: A | Discharge status: Home / Self Care | Payer: BLUE CROSS/BLUE SHIELD | Source: Ambulatory Visit | Attending: Family Medicine | Admitting: Family Medicine

## 2016-04-27 ENCOUNTER — Encounter (HOSPITAL_COMMUNITY): Payer: Self-pay

## 2016-04-27 ENCOUNTER — Other Ambulatory Visit (HOSPITAL_COMMUNITY): Payer: Self-pay | Admitting: Obstetrics and Gynecology

## 2016-04-27 DIAGNOSIS — O09522 Supervision of elderly multigravida, second trimester: Secondary | ICD-10-CM | POA: Diagnosis not present

## 2016-04-27 DIAGNOSIS — Z3A27 27 weeks gestation of pregnancy: Secondary | ICD-10-CM

## 2016-04-27 DIAGNOSIS — O365990 Maternal care for other known or suspected poor fetal growth, unspecified trimester, not applicable or unspecified: Secondary | ICD-10-CM

## 2016-04-27 DIAGNOSIS — O365929 Maternal care for other known or suspected poor fetal growth, second trimester, other fetus: Secondary | ICD-10-CM

## 2016-04-27 DIAGNOSIS — O162 Unspecified maternal hypertension, second trimester: Secondary | ICD-10-CM

## 2016-04-27 DIAGNOSIS — O365920 Maternal care for other known or suspected poor fetal growth, second trimester, not applicable or unspecified: Secondary | ICD-10-CM | POA: Insufficient documentation

## 2016-04-27 DIAGNOSIS — O10012 Pre-existing essential hypertension complicating pregnancy, second trimester: Secondary | ICD-10-CM | POA: Insufficient documentation

## 2016-04-27 DIAGNOSIS — O3662X0 Maternal care for excessive fetal growth, second trimester, not applicable or unspecified: Secondary | ICD-10-CM

## 2016-04-28 ENCOUNTER — Other Ambulatory Visit (HOSPITAL_COMMUNITY): Payer: Self-pay | Admitting: *Deleted

## 2016-04-28 DIAGNOSIS — O365990 Maternal care for other known or suspected poor fetal growth, unspecified trimester, not applicable or unspecified: Secondary | ICD-10-CM

## 2016-04-30 ENCOUNTER — Encounter (HOSPITAL_COMMUNITY): Payer: Self-pay

## 2016-04-30 ENCOUNTER — Other Ambulatory Visit (HOSPITAL_COMMUNITY): Payer: Self-pay | Admitting: *Deleted

## 2016-04-30 ENCOUNTER — Ambulatory Visit (HOSPITAL_COMMUNITY)
Admission: RE | Admit: 2016-04-30 | Discharge: 2016-04-30 | Disposition: A | Discharge status: Home / Self Care | Payer: BLUE CROSS/BLUE SHIELD | Source: Ambulatory Visit | Attending: Family Medicine | Admitting: Family Medicine

## 2016-04-30 DIAGNOSIS — O09522 Supervision of elderly multigravida, second trimester: Secondary | ICD-10-CM | POA: Insufficient documentation

## 2016-04-30 DIAGNOSIS — O365990 Maternal care for other known or suspected poor fetal growth, unspecified trimester, not applicable or unspecified: Secondary | ICD-10-CM

## 2016-04-30 DIAGNOSIS — O10019 Pre-existing essential hypertension complicating pregnancy, unspecified trimester: Secondary | ICD-10-CM

## 2016-04-30 DIAGNOSIS — O10012 Pre-existing essential hypertension complicating pregnancy, second trimester: Secondary | ICD-10-CM | POA: Insufficient documentation

## 2016-04-30 DIAGNOSIS — Z3A27 27 weeks gestation of pregnancy: Secondary | ICD-10-CM | POA: Insufficient documentation

## 2016-04-30 DIAGNOSIS — O0990 Supervision of high risk pregnancy, unspecified, unspecified trimester: Secondary | ICD-10-CM

## 2016-04-30 DIAGNOSIS — O365920 Maternal care for other known or suspected poor fetal growth, second trimester, not applicable or unspecified: Secondary | ICD-10-CM | POA: Insufficient documentation

## 2016-04-30 DIAGNOSIS — O09529 Supervision of elderly multigravida, unspecified trimester: Secondary | ICD-10-CM

## 2016-05-01 ENCOUNTER — Encounter (HOSPITAL_COMMUNITY): Payer: Self-pay

## 2016-05-04 ENCOUNTER — Other Ambulatory Visit (HOSPITAL_COMMUNITY): Payer: Self-pay | Admitting: Obstetrics and Gynecology

## 2016-05-04 ENCOUNTER — Ambulatory Visit (HOSPITAL_COMMUNITY)
Admission: RE | Admit: 2016-05-04 | Discharge: 2016-05-04 | Disposition: A | Discharge status: Home / Self Care | Payer: BLUE CROSS/BLUE SHIELD | Source: Ambulatory Visit | Attending: Family Medicine | Admitting: Family Medicine

## 2016-05-04 ENCOUNTER — Encounter (HOSPITAL_COMMUNITY): Payer: Self-pay

## 2016-05-04 DIAGNOSIS — O365990 Maternal care for other known or suspected poor fetal growth, unspecified trimester, not applicable or unspecified: Secondary | ICD-10-CM

## 2016-05-04 DIAGNOSIS — Z3A28 28 weeks gestation of pregnancy: Secondary | ICD-10-CM | POA: Diagnosis not present

## 2016-05-04 DIAGNOSIS — O09523 Supervision of elderly multigravida, third trimester: Secondary | ICD-10-CM

## 2016-05-04 DIAGNOSIS — O365930 Maternal care for other known or suspected poor fetal growth, third trimester, not applicable or unspecified: Secondary | ICD-10-CM | POA: Insufficient documentation

## 2016-05-04 DIAGNOSIS — O10919 Unspecified pre-existing hypertension complicating pregnancy, unspecified trimester: Secondary | ICD-10-CM

## 2016-05-04 DIAGNOSIS — O10019 Pre-existing essential hypertension complicating pregnancy, unspecified trimester: Secondary | ICD-10-CM

## 2016-05-04 DIAGNOSIS — O0990 Supervision of high risk pregnancy, unspecified, unspecified trimester: Secondary | ICD-10-CM

## 2016-05-04 DIAGNOSIS — O09529 Supervision of elderly multigravida, unspecified trimester: Secondary | ICD-10-CM

## 2016-05-04 DIAGNOSIS — O10013 Pre-existing essential hypertension complicating pregnancy, third trimester: Secondary | ICD-10-CM | POA: Diagnosis not present

## 2016-05-07 ENCOUNTER — Ambulatory Visit (HOSPITAL_COMMUNITY)
Admission: RE | Admit: 2016-05-07 | Discharge: 2016-05-07 | Disposition: A | Discharge status: Home / Self Care | Payer: BLUE CROSS/BLUE SHIELD | Source: Ambulatory Visit | Attending: Family Medicine | Admitting: Family Medicine

## 2016-05-07 ENCOUNTER — Encounter (HOSPITAL_COMMUNITY): Payer: Self-pay

## 2016-05-07 DIAGNOSIS — O10013 Pre-existing essential hypertension complicating pregnancy, third trimester: Secondary | ICD-10-CM | POA: Insufficient documentation

## 2016-05-07 DIAGNOSIS — O09523 Supervision of elderly multigravida, third trimester: Secondary | ICD-10-CM | POA: Insufficient documentation

## 2016-05-07 DIAGNOSIS — Z3A28 28 weeks gestation of pregnancy: Secondary | ICD-10-CM | POA: Insufficient documentation

## 2016-05-07 DIAGNOSIS — O365990 Maternal care for other known or suspected poor fetal growth, unspecified trimester, not applicable or unspecified: Secondary | ICD-10-CM

## 2016-05-07 DIAGNOSIS — O365930 Maternal care for other known or suspected poor fetal growth, third trimester, not applicable or unspecified: Secondary | ICD-10-CM | POA: Diagnosis not present

## 2016-05-11 ENCOUNTER — Encounter (HOSPITAL_COMMUNITY): Payer: Self-pay | Admitting: *Deleted

## 2016-05-11 ENCOUNTER — Other Ambulatory Visit (HOSPITAL_COMMUNITY): Payer: Self-pay | Admitting: Maternal and Fetal Medicine

## 2016-05-11 ENCOUNTER — Encounter (HOSPITAL_COMMUNITY): Payer: Self-pay

## 2016-05-11 ENCOUNTER — Ambulatory Visit (HOSPITAL_COMMUNITY)
Admission: RE | Admit: 2016-05-11 | Discharge: 2016-05-11 | Disposition: A | Discharge status: Home / Self Care | Payer: BLUE CROSS/BLUE SHIELD | Source: Ambulatory Visit | Attending: Family Medicine | Admitting: Family Medicine

## 2016-05-11 ENCOUNTER — Inpatient Hospital Stay (HOSPITAL_COMMUNITY)
Admission: AD | Admit: 2016-05-11 | Discharge: 2016-05-18 | DRG: 765 | Disposition: A | Discharge status: Home / Self Care | Payer: BLUE CROSS/BLUE SHIELD | Source: Ambulatory Visit | Attending: Family Medicine | Admitting: Family Medicine

## 2016-05-11 DIAGNOSIS — O09523 Supervision of elderly multigravida, third trimester: Secondary | ICD-10-CM | POA: Diagnosis not present

## 2016-05-11 DIAGNOSIS — O365930 Maternal care for other known or suspected poor fetal growth, third trimester, not applicable or unspecified: Secondary | ICD-10-CM | POA: Diagnosis not present

## 2016-05-11 DIAGNOSIS — K219 Gastro-esophageal reflux disease without esophagitis: Secondary | ICD-10-CM | POA: Diagnosis present

## 2016-05-11 DIAGNOSIS — O0990 Supervision of high risk pregnancy, unspecified, unspecified trimester: Secondary | ICD-10-CM

## 2016-05-11 DIAGNOSIS — O10013 Pre-existing essential hypertension complicating pregnancy, third trimester: Secondary | ICD-10-CM

## 2016-05-11 DIAGNOSIS — Z833 Family history of diabetes mellitus: Secondary | ICD-10-CM

## 2016-05-11 DIAGNOSIS — Z3A29 29 weeks gestation of pregnancy: Secondary | ICD-10-CM | POA: Diagnosis not present

## 2016-05-11 DIAGNOSIS — Z87891 Personal history of nicotine dependence: Secondary | ICD-10-CM

## 2016-05-11 DIAGNOSIS — O163 Unspecified maternal hypertension, third trimester: Secondary | ICD-10-CM

## 2016-05-11 DIAGNOSIS — O09529 Supervision of elderly multigravida, unspecified trimester: Secondary | ICD-10-CM

## 2016-05-11 DIAGNOSIS — O10019 Pre-existing essential hypertension complicating pregnancy, unspecified trimester: Secondary | ICD-10-CM

## 2016-05-11 DIAGNOSIS — O1002 Pre-existing essential hypertension complicating childbirth: Principal | ICD-10-CM | POA: Diagnosis present

## 2016-05-11 DIAGNOSIS — O365990 Maternal care for other known or suspected poor fetal growth, unspecified trimester, not applicable or unspecified: Secondary | ICD-10-CM | POA: Diagnosis present

## 2016-05-11 DIAGNOSIS — O9962 Diseases of the digestive system complicating childbirth: Secondary | ICD-10-CM | POA: Diagnosis present

## 2016-05-11 DIAGNOSIS — Z8249 Family history of ischemic heart disease and other diseases of the circulatory system: Secondary | ICD-10-CM

## 2016-05-11 DIAGNOSIS — O321XX0 Maternal care for breech presentation, not applicable or unspecified: Secondary | ICD-10-CM | POA: Diagnosis present

## 2016-05-11 LAB — CBC
HEMATOCRIT: 37 % (ref 36.0–46.0)
Hemoglobin: 12.5 g/dL (ref 12.0–15.0)
MCH: 36.1 pg — AB (ref 26.0–34.0)
MCHC: 33.8 g/dL (ref 30.0–36.0)
MCV: 106.9 fL — AB (ref 78.0–100.0)
Platelets: 224 10*3/uL (ref 150–400)
RBC: 3.46 MIL/uL — ABNORMAL LOW (ref 3.87–5.11)
RDW: 14 % (ref 11.5–15.5)
WBC: 8.8 10*3/uL (ref 4.0–10.5)

## 2016-05-11 LAB — TYPE AND SCREEN
ABO/RH(D): B POS
Antibody Screen: NEGATIVE

## 2016-05-11 MED ORDER — PRENATAL MULTIVITAMIN CH
1.0000 | ORAL_TABLET | Freq: Every day | ORAL | Status: DC
  Administered 2016-05-12 – 2016-05-15 (×4): 1 via ORAL
  Filled 2016-05-11 (×5): qty 1

## 2016-05-11 MED ORDER — LABETALOL HCL 200 MG PO TABS
200.0000 mg | ORAL_TABLET | Freq: Two times a day (BID) | ORAL | Status: DC
  Administered 2016-05-11 – 2016-05-16 (×9): 200 mg via ORAL
  Filled 2016-05-11 (×12): qty 1

## 2016-05-11 MED ORDER — DOCUSATE SODIUM 100 MG PO CAPS
100.0000 mg | ORAL_CAPSULE | Freq: Every day | ORAL | Status: DC
  Administered 2016-05-12 – 2016-05-16 (×5): 100 mg via ORAL
  Filled 2016-05-11 (×5): qty 1

## 2016-05-11 MED ORDER — ASPIRIN 81 MG PO CHEW
81.0000 mg | CHEWABLE_TABLET | Freq: Every day | ORAL | Status: DC
  Administered 2016-05-12 – 2016-05-16 (×5): 81 mg via ORAL
  Filled 2016-05-11 (×6): qty 1

## 2016-05-11 MED ORDER — PANTOPRAZOLE SODIUM 40 MG PO TBEC
40.0000 mg | DELAYED_RELEASE_TABLET | Freq: Every day | ORAL | Status: DC
  Administered 2016-05-12 – 2016-05-16 (×5): 40 mg via ORAL
  Filled 2016-05-11 (×4): qty 1

## 2016-05-11 MED ORDER — ACETAMINOPHEN 325 MG PO TABS
650.0000 mg | ORAL_TABLET | ORAL | Status: DC | PRN
  Administered 2016-05-12: 650 mg via ORAL
  Filled 2016-05-11 (×2): qty 2

## 2016-05-11 MED ORDER — ZOLPIDEM TARTRATE 5 MG PO TABS: 5.0000 mg | ORAL_TABLET | Freq: Every evening | ORAL | Status: DC | PRN

## 2016-05-11 MED ORDER — CALCIUM CARBONATE ANTACID 500 MG PO CHEW: 2.0000 | CHEWABLE_TABLET | ORAL | Status: DC | PRN

## 2016-05-11 NOTE — H&P (Signed)
Faculty Practice Antenatal History and Physical  Suzanne StableJamila [name] WUJ:[id number] DOB: [birth year] DOA: 05/11/2016  Chief Complaint: reverse flow on umbilical doppler  HPI: Suzanne [name] is a 40 y.o. female G2P0010 with IUP at 3871w3d with CHTN. She [facility]. She received BMZ x2 and was released with improvement of her dopplers. Today, she had a growth US with umbilical dopplers, which showed IUGR with the baby measuring 26-27 weeks and persistent reverse flow. BPP was also done, which was 8/8. The patient was discussed with me by Dr Sherrie Georgeecker of [facility], who recommended observation for 24 hours.   Review of Systems:   Pt denies any fevers, chills, nausea, vomiting, headache, blurred vision, abdominal pain, decreased fetal movement, vaginal bleeding.  Review of systems are otherwise negative  Prenatal History/Complications:  Past Medical History: Past Medical History:  Diagnosis Date  . GERD (gastroesophageal reflux disease)   . Hypertension   . Sciatica     Past Surgical History: Past Surgical History:  Procedure Laterality Date  . CERVICAL BIOPSY    . COLPOSCOPY      Obstetrical History: OB History    Gravida Para Term Preterm AB Living   2       1     SAB TAB Ectopic Multiple Live Births   1              Gynecological History: OB History    Gravida Para Term Preterm AB Living   2       1     SAB TAB Ectopic Multiple Live Births   1              Social History: Social History   Social History  . Marital status: Single    Spouse name: N/A  . Number of children: N/A  . Years of education: N/A   Social History Main Topics  . Smoking status: Former Smoker    Packs/day: 0.30    Years: 15.00    Types: Cigarettes    Quit date: 01/02/2016  . Smokeless tobacco: Never Used  . Alcohol use No     Comment: rare  . Drug use:     Frequency: 1.0 time per week    Types: Marijuana  . Sexual activity: Not Currently    Other Topics Concern  . None   Social History Narrative  . None    Family History: Family History  Problem Relation Age of Onset  . Diabetes Mother   . Hypertension Mother   . GER disease Mother     Allergies: Allergies  Allergen Reactions  . Coconut Flavor Nausea Only        . Latex Itching  . Pineapple Itching and Swelling    Tongue swelling  . Tomato Itching and Swelling    Tongue swelling.     Prescriptions Prior to Admission  Medication Sig Dispense Refill Last Dose  . aspirin 81 MG chewable tablet Chew 1 tablet (81 mg total) by mouth daily. 90 tablet 2 Taking  . labetalol (NORMODYNE) 200 MG tablet Take 1 tablet (200 mg total) by mouth 2 (two) times daily. 30 tablet 1 Taking  . omeprazole (PRILOSEC) 20 MG capsule Take 20 mg by mouth daily.   Taking  . Prenat w/o A Vit-FeFum-FePo-FA (CONCEPT OB) 130-92.4-1 MG CAPS Take 1 capsule by mouth daily. 30 capsule 11 Taking    Physical Exam: BP (!) 143/91 (BP Location: Right Arm)   Pulse 80  Temp 98.5 F (36.9 C) (Oral)   Resp 18   Ht 5\' 6"  (1.676 m)   LMP 10/18/2015 (Exact Date)   BMI 33.67 kg/m   BP (!) 143/91 (BP Location: Right Arm)   Pulse 80   Temp 98.5 F (36.9 C) (Oral)   Resp 18   Ht 5\' 6"  (1.676 m)   LMP 10/18/2015 (Exact Date)   BMI 33.67 kg/m  Head: Normocephalic, without obvious abnormality, atraumatic Neck: no adenopathy, no carotid bruit, no JVD, supple, symmetrical, trachea midline and thyroid not enlarged, symmetric, no tenderness/mass/nodules Lungs: clear to auscultation bilaterally Heart: regular rate and rhythm, S1, S2 normal, no murmur, click, rub or gallop Abdomen: soft, non-tender; bowel sounds normal; no masses,  no organomegaly and fundal height less than dates Extremities: extremities normal, atraumatic, no cyanosis or edema Pulses: 2+ and symmetric Skin: Skin color, texture, turgor normal. No rashes or lesions Neurologic: Alert and oriented X 3, normal strength and tone.  Normal symmetric reflexes. Normal coordination and gait cephalic Baseline: 140s bpm, Variability: Good {> 6 bpm), Accelerations: Non-reactive but appropriate for gestational age and Decelerations: Absent None             Labs on Admission:  Basic Metabolic Panel: No results for input(s): NA, K, CL, CO2, GLUCOSE, BUN, CREATININE, CALCIUM, MG, PHOS in the last 168 hours. Liver Function Tests: No results for input(s): AST, ALT, ALKPHOS, BILITOT, PROT, ALBUMIN in the last 168 hours. No results for input(s): LIPASE, AMYLASE in the last 168 hours. No results for input(s): AMMONIA in the last 168 hours. CBC: No results for input(s): WBC, NEUTROABS, HGB, HCT, MCV, PLT in the last 168 hours.  CBG: No results for input(s): GLUCAP in the last 168 hours.  Radiological Exams on Admission: No results found.   Assessment/Plan Principal Problem:   IUGR (intrauterine growth restriction) affecting care of mother Active Problems:   AMA (advanced maternal age) multigravida 40+   Hypertension in pregnancy, essential, antepartum  1. IUGR  24hr Obs  While reverse flow is worrisome, the BPP is 8/8, which is reassuring. In discussing the patient with Dr Sherrie George, she felt that delivery now was not necessary.    Continuous monitoring.  If appropriate for gestational age, then can be discharged to home.  Deliver with repetitive decelerations  If reassuring for GA, d/c home with increasing dopplers to M/W/F.  Will get CBC, type and screen 2. HTN  Continue ASA  Continue Labetalol 3. AMA   Levie Heritage, DO 05/11/2016 1:37 PM Faculty Practice Attending Physician [facility] Attending Phone #: [phone]

## 2016-05-12 ENCOUNTER — Observation Stay (HOSPITAL_COMMUNITY): Payer: BLUE CROSS/BLUE SHIELD

## 2016-05-12 DIAGNOSIS — O10013 Pre-existing essential hypertension complicating pregnancy, third trimester: Secondary | ICD-10-CM | POA: Diagnosis not present

## 2016-05-12 DIAGNOSIS — Z3A29 29 weeks gestation of pregnancy: Secondary | ICD-10-CM | POA: Diagnosis not present

## 2016-05-12 DIAGNOSIS — O365930 Maternal care for other known or suspected poor fetal growth, third trimester, not applicable or unspecified: Secondary | ICD-10-CM | POA: Diagnosis not present

## 2016-05-12 MED ORDER — SODIUM CHLORIDE 0.9% FLUSH
3.0000 mL | Freq: Two times a day (BID) | INTRAVENOUS | Status: DC
  Administered 2016-05-12 – 2016-05-15 (×5): 3 mL via INTRAVENOUS

## 2016-05-12 MED ORDER — BETAMETHASONE SOD PHOS & ACET 6 (3-3) MG/ML IJ SUSP
12.0000 mg | INTRAMUSCULAR | Status: AC
  Administered 2016-05-12 – 2016-05-13 (×2): 12 mg via INTRAMUSCULAR
  Filled 2016-05-12 (×2): qty 2

## 2016-05-12 NOTE — Consult Note (Signed)
Neonatology Antenatal Consultation: Requested by Dr. Macon LargeAnyanwu Reason: premature delivery, IUGR  I saw Ms. Suzanne [name] this morning to discuss the likely hospital course of a baby born at nearly [redacted] weeks EGA.   We discussed the likely need for respiratory support with mechanical ventilation, the problems with feeding and intestinal immaturity, likely high chance of survival, and possibilities of neurologic problems.  I answered here questions about the likely length of stay assuming no serious complications.  Micha Dosanjh L. Cleatis PolkaAuten, M.D.

## 2016-05-12 NOTE — Progress Notes (Signed)
FACULTY PRACTICE ANTEPARTUM COMPREHENSIVE PROGRESS NOTE  Mariea StableJamila Masoud is a 40 y.o. G2P0010 at 6161w4d who [facility] with persistent reverse flow on UA dopplers.  Admitted on 05/11/16.  Estimated Date of Delivery: 07/24/16  Fetal presentation is breech on 05/12/16 ultrasound.  Length of Stay:  1 Days. Admitted 05/11/2016  Subjective: No complaints Patient reports good fetal movement.  She reports no uterine contractions, no bleeding and no loss of fluid per vagina.  Vitals:  Blood pressure (!) 142/81, pulse 74, temperature 98.2 F (36.8 C), resp. rate 20, height 5\' 6"  (1.676 m), weight 208 lb 9 oz (94.6 kg), last menstrual period 10/18/2015, unknown if currently breastfeeding. Physical Examination: CONSTITUTIONAL: Well-developed, well-nourished female in no acute distress.  HENT:  Normocephalic, atraumatic, External right and left ear normal. Oropharynx is clear and moist EYES: Conjunctivae and EOM are normal. Pupils are equal, round, and reactive to light. No scleral icterus.  NECK: Normal range of motion, supple, no masses SKIN: Skin is warm and dry. No rash noted. Not diaphoretic. No erythema. No pallor. NEUROLGIC: Alert and oriented to person, place, and time. Normal reflexes, muscle tone coordination. No cranial nerve deficit noted. PSYCHIATRIC: Normal mood and affect. Normal behavior. Normal judgment and thought content. CARDIOVASCULAR: Normal heart rate noted, regular rhythm RESPIRATORY: Effort and breath sounds normal, no problems with respiration noted MUSCULOSKELETAL: Normal range of motion. No edema and no tenderness. 2+ distal pulses. ABDOMEN: Soft, nontender, nondistended, gravid. CERVIX:    Fetal monitoring: FHR: 140 bpm, Variability: minimal, Accelerations: None, Decelerations: Absent currently Uterine activity: No contractions  Results for orders placed or performed during the hospital encounter of 05/11/16 (from the past 48 hour(s))  Type and screen  [facility]     Status: None   Collection Time: 05/11/16  2:35 PM  Result Value Ref Range   ABO/RH(D) B POS    Antibody Screen NEG    Sample Expiration 05/14/2016   CBC     Status: Abnormal   Collection Time: 05/11/16  2:35 PM  Result Value Ref Range   WBC 8.8 4.0 - 10.5 K/uL   RBC 3.46 (L) 3.87 - 5.11 MIL/uL   Hemoglobin 12.5 12.0 - 15.0 g/dL   HCT 40.937.0 81.136.0 - 91.446.0 %   MCV 106.9 (H) 78.0 - 100.0 fL   MCH 36.1 (H) 26.0 - 34.0 pg   MCHC 33.8 30.0 - 36.0 g/dL   RDW 78.214.0 95.611.5 - 21.315.5 %   Platelets 224 150 - 400 K/uL    05/12/16  BPP 8/8, persistent reversed diastolic flow on UA dopplers, AFI 9.92 cm, breech presentation, normal MCA dopplers with disatolic flow noted during A-wave.   Current scheduled medications . aspirin  81 mg Oral Daily  . betamethasone acetate-betamethasone sodium phosphate  12 mg Intramuscular Q24 Hr x 2  . docusate sodium  100 mg Oral Daily  . labetalol  200 mg Oral BID  . pantoprazole  40 mg Oral Daily  . prenatal multivitamin  1 tablet Oral Q1200    I have reviewed the patient's current medications.  ASSESSMENT: Principal Problem:   IUGR (intrauterine growth restriction) affecting care of mother Active Problems:   AMA (advanced maternal age) multigravida 40+   Hypertension in pregnancy, essential, antepartum   PLAN: 1. IUGR/Abnormal UA dopplers  According to MFM, while reverse flow is worrisome, the BPP is 8/8, which is reassuring. Recommend delivery based on fetal tracing, 2x/week UA doppler and MCA doppler studies (scheduled on 05/14/16)  Continuous inpatient monitoring.  Deliver with repetitive decelerations  MFM recommended repeat betamethasone course, ordered today  NICU consult ordered; spoke with Dr. Cleatis Polka  2. HTN  Stable BP, no severe features  Continue ASA  Continue Labetalol  Continue routine antenatal care.   Jaynie Collins, MD, FACOG Attending Obstetrician & Gynecologist Faculty Practice, [facility]

## 2016-05-13 DIAGNOSIS — K219 Gastro-esophageal reflux disease without esophagitis: Secondary | ICD-10-CM | POA: Diagnosis present

## 2016-05-13 DIAGNOSIS — Z833 Family history of diabetes mellitus: Secondary | ICD-10-CM | POA: Diagnosis not present

## 2016-05-13 DIAGNOSIS — Z3A29 29 weeks gestation of pregnancy: Secondary | ICD-10-CM

## 2016-05-13 DIAGNOSIS — O365930 Maternal care for other known or suspected poor fetal growth, third trimester, not applicable or unspecified: Secondary | ICD-10-CM | POA: Diagnosis present

## 2016-05-13 DIAGNOSIS — O134 Gestational [pregnancy-induced] hypertension without significant proteinuria, complicating childbirth: Secondary | ICD-10-CM | POA: Diagnosis not present

## 2016-05-13 DIAGNOSIS — O9962 Diseases of the digestive system complicating childbirth: Secondary | ICD-10-CM | POA: Diagnosis present

## 2016-05-13 DIAGNOSIS — Z87891 Personal history of nicotine dependence: Secondary | ICD-10-CM | POA: Diagnosis not present

## 2016-05-13 DIAGNOSIS — O1002 Pre-existing essential hypertension complicating childbirth: Secondary | ICD-10-CM | POA: Diagnosis present

## 2016-05-13 DIAGNOSIS — Z8249 Family history of ischemic heart disease and other diseases of the circulatory system: Secondary | ICD-10-CM | POA: Diagnosis not present

## 2016-05-13 DIAGNOSIS — O321XX0 Maternal care for breech presentation, not applicable or unspecified: Secondary | ICD-10-CM | POA: Diagnosis present

## 2016-05-13 NOTE — Progress Notes (Signed)
Patient ID: Suzanne [name], female   DOB: 07-15-1976, 40 y.o.   MRN: [id number] FACULTY PRACTICE ANTEPARTUM(COMPREHENSIVE) NOTE  Suzanne [name] is a 40 y.o. G2P0010 at 5273w5d by best clinical estimate who is admitted for IUGR with REDF.   Fetal presentation is breech. Length of Stay:  1  Days  Subjective: Patient feels well. Patient reports the fetal movement as active. Patient reports uterine contraction  activity as none. Patient reports  vaginal bleeding as none. Patient describes fluid per vagina as None.  Vitals:  Blood pressure 125/72, pulse 82, temperature 98.6 F (37 C), temperature source Oral, resp. rate 18, height 5\' 6"  (1.676 m), weight 208 lb 9 oz (94.6 kg), last menstrual period 10/18/2015, unknown if currently breastfeeding. Physical Examination:  General appearance - alert, well appearing, and in no distress Chest - normal effort Abdomen - gravid, NT Fundal Height:  size equals dates Extremities: Homans sign is negative, no sign of DVT  Membranes:intact  Fetal Monitoring:  Baseline: 140 bpm, Variability: Good {> 6 bpm), Accelerations: Non-reactive but appropriate for gestational age and Decelerations: Occurs rare  Medications:  Scheduled . aspirin  81 mg Oral Daily  . betamethasone acetate-betamethasone sodium phosphate  12 mg Intramuscular Q24 Hr x 2  . docusate sodium  100 mg Oral Daily  . labetalol  200 mg Oral BID  . pantoprazole  40 mg Oral Daily  . prenatal multivitamin  1 tablet Oral Q1200  . sodium chloride flush  3 mL Intravenous Q12H   I have reviewed the patient's current medications.  ASSESSMENT: Principal Problem:   IUGR (intrauterine growth restriction) affecting care of mother Active Problems:   AMA (advanced maternal age) multigravida 40+   Hypertension in pregnancy, essential, antepartum   PLAN: BP is stable, continue labetalol Persistent REDF, but reassuring short term testing with BPP 8/8 and reassuring FHR on monitoring. Continue for  now. Delivery with short term acute changes to FHR or change in BPP--recheck tomorrow. Continue inpatient monitoring. NICU aware and holding bed for this patient. Complete BMZ, 2nd course.  Reva Boresanya S Eustacio Ellen, MD 05/13/2016,7:18 AM

## 2016-05-14 ENCOUNTER — Inpatient Hospital Stay (HOSPITAL_COMMUNITY): Payer: BLUE CROSS/BLUE SHIELD

## 2016-05-14 ENCOUNTER — Encounter: Payer: Self-pay | Admitting: Obstetrics and Gynecology

## 2016-05-14 ENCOUNTER — Ambulatory Visit (HOSPITAL_COMMUNITY): Admission: RE | Admit: 2016-05-14 | Payer: BLUE CROSS/BLUE SHIELD | Source: Ambulatory Visit

## 2016-05-14 LAB — TYPE AND SCREEN
ABO/RH(D): B POS
Antibody Screen: NEGATIVE

## 2016-05-14 NOTE — Progress Notes (Signed)
Patient ID: Suzanne [name], female   DOB: 09-30-1975, 40 y.o.   MRN: [id number] ACULTY PRACTICE ANTEPARTUM COMPREHENSIVE PROGRESS NOTE  Suzanne [name] is a 40 y.o. G2P0010 at 4066w6d  who is admitted for IUGR; AMA, chronic HTN.   Fetal presentation is cephalic. Length of Stay:  2  Days  Subjective: Pt denies HA, visual changes or RUQ pain. She reports good FM. No LOF or VB Patient reports good fetal movement.  She reports no uterine contractions, no bleeding and no loss of fluid per vagina.  Vitals:  Blood pressure 130/75, pulse 80, temperature 98 F (36.7 C), temperature source Oral, resp. rate 20, height 5\' 6"  (1.676 m), weight 208 lb 9 oz (94.6 kg), last menstrual period 10/18/2015, unknown if currently breastfeeding. Physical Examination: General appearance - alert, well appearing, and in no distress Abdomen - soft, nontender, nondistended, no masses or organomegaly gravid Extremities - no pedal edema noted, Homan's sign negative bilaterally Cervical Exam: Not evaluated. Membranes:intact  Fetal Monitoring:  Baseline: 130's bpm, Variability: Good {> 6 bpm), Accelerations: Non-reactive but appropriate for gestational age and Decelerations: Absent  Labs:  No results found for this or any previous visit (from the past 24 hour(s)).  Imaging Studies:    10/19 the prev noted reverse flow was not present. Ductus venosus dopplers were WNL   Medications:  Scheduled . aspirin  81 mg Oral Daily  . docusate sodium  100 mg Oral Daily  . labetalol  200 mg Oral BID  . pantoprazole  40 mg Oral Daily  . prenatal multivitamin  1 tablet Oral Q1200  . sodium chloride flush  3 mL Intravenous Q12H   I have reviewed the patient's current medications.  ASSESSMENT: Patient Active Problem List   Diagnosis Date Noted  . IUGR (intrauterine growth restriction) affecting care of mother 05/11/2016  . Supervision of high risk pregnancy, antepartum 01/09/2016  . AMA (advanced maternal age) multigravida  3740+ 01/09/2016  . GERD (gastroesophageal reflux disease) 01/09/2016  . Hypertension, benign 01/09/2016  . Hypertension in pregnancy, essential, antepartum 01/09/2016    PLAN: BP is stable, continue labetalol resolved REDF, reassuring short term testing with BPP 8/8 and reassuring FHR on monitoring. Suspect due to steroid benefit.  Continue current plan for now. Delivery with short term acute changes to FHR or change in BPP--recheck in 2 days 10/21 Continue inpatient monitoring. NICU aware and holding bed for this patient. Complete BMZ, 2nd course. Continue routine antenatal care. Dr. Sherrie Georgeecker evaluated and spoke to pt earlier today. Pt comfortable with plan.   Pt will need Magnesium sulfate for neuroprotection to start once it is determined that pt will proceed to delivery.  HARRAWAY-SMITH, Dayvion Sans 05/14/2016,12:16 PM

## 2016-05-15 ENCOUNTER — Inpatient Hospital Stay (HOSPITAL_COMMUNITY): Payer: BLUE CROSS/BLUE SHIELD | Admitting: Anesthesiology

## 2016-05-15 ENCOUNTER — Encounter (HOSPITAL_COMMUNITY): Payer: Self-pay | Admitting: *Deleted

## 2016-05-15 ENCOUNTER — Inpatient Hospital Stay (HOSPITAL_COMMUNITY): Payer: BLUE CROSS/BLUE SHIELD

## 2016-05-15 ENCOUNTER — Encounter (HOSPITAL_COMMUNITY)
Admission: AD | Disposition: A | Discharge status: Home / Self Care | Payer: Self-pay | Source: Ambulatory Visit | Attending: Family Medicine

## 2016-05-15 DIAGNOSIS — Z3A29 29 weeks gestation of pregnancy: Secondary | ICD-10-CM

## 2016-05-15 DIAGNOSIS — O365930 Maternal care for other known or suspected poor fetal growth, third trimester, not applicable or unspecified: Secondary | ICD-10-CM

## 2016-05-15 DIAGNOSIS — Z3A39 39 weeks gestation of pregnancy: Secondary | ICD-10-CM

## 2016-05-15 DIAGNOSIS — O134 Gestational [pregnancy-induced] hypertension without significant proteinuria, complicating childbirth: Secondary | ICD-10-CM

## 2016-05-15 DIAGNOSIS — O321XX0 Maternal care for breech presentation, not applicable or unspecified: Secondary | ICD-10-CM

## 2016-05-15 LAB — CBC
HCT: 34.9 % — ABNORMAL LOW (ref 36.0–46.0)
HEMOGLOBIN: 11.7 g/dL — AB (ref 12.0–15.0)
MCH: 35.9 pg — ABNORMAL HIGH (ref 26.0–34.0)
MCHC: 33.5 g/dL (ref 30.0–36.0)
MCV: 107.1 fL — ABNORMAL HIGH (ref 78.0–100.0)
PLATELETS: 227 10*3/uL (ref 150–400)
RBC: 3.26 MIL/uL — AB (ref 3.87–5.11)
RDW: 13.5 % (ref 11.5–15.5)
WBC: 12.5 10*3/uL — ABNORMAL HIGH (ref 4.0–10.5)

## 2016-05-15 SURGERY — Surgical Case
Anesthesia: Spinal | Site: Abdomen

## 2016-05-15 MED ORDER — LABETALOL HCL 5 MG/ML IV SOLN: 20.0000 mg | INTRAVENOUS | Status: DC | PRN

## 2016-05-15 MED ORDER — NALBUPHINE HCL 10 MG/ML IJ SOLN: 5.0000 mg | Freq: Once | INTRAMUSCULAR | Status: DC | PRN

## 2016-05-15 MED ORDER — ONDANSETRON HCL 4 MG/2ML IJ SOLN
INTRAMUSCULAR | Status: AC
  Filled 2016-05-15: qty 2

## 2016-05-15 MED ORDER — SODIUM CHLORIDE 0.9% FLUSH: 3.0000 mL | INTRAVENOUS | Status: DC | PRN

## 2016-05-15 MED ORDER — MEPERIDINE HCL 25 MG/ML IJ SOLN: 6.2500 mg | INTRAMUSCULAR | Status: DC | PRN

## 2016-05-15 MED ORDER — SCOPOLAMINE 1 MG/3DAYS TD PT72: 1.0000 | MEDICATED_PATCH | Freq: Once | TRANSDERMAL | Status: DC

## 2016-05-15 MED ORDER — SODIUM CHLORIDE 0.9 % IV BOLUS (SEPSIS)
500.0000 mL | Freq: Once | INTRAVENOUS | Status: AC
  Administered 2016-05-15: 500 mL via INTRAVENOUS

## 2016-05-15 MED ORDER — BUPIVACAINE IN DEXTROSE 0.75-8.25 % IT SOLN
INTRATHECAL | Status: DC | PRN
  Administered 2016-05-15: 1.4 mL via INTRATHECAL

## 2016-05-15 MED ORDER — DEXAMETHASONE SODIUM PHOSPHATE 4 MG/ML IJ SOLN
INTRAMUSCULAR | Status: AC
  Filled 2016-05-15: qty 1

## 2016-05-15 MED ORDER — METOCLOPRAMIDE HCL 5 MG/ML IJ SOLN: 10.0000 mg | Freq: Once | INTRAMUSCULAR | Status: DC | PRN

## 2016-05-15 MED ORDER — PHENYLEPHRINE 8 MG IN D5W 100 ML (0.08MG/ML) PREMIX OPTIME
INJECTION | INTRAVENOUS | Status: DC | PRN
  Administered 2016-05-15: 20 ug/min via INTRAVENOUS

## 2016-05-15 MED ORDER — METOCLOPRAMIDE HCL 5 MG/ML IJ SOLN
INTRAMUSCULAR | Status: AC
  Filled 2016-05-15: qty 2

## 2016-05-15 MED ORDER — MAGNESIUM SULFATE 50 % IJ SOLN
2.0000 g/h | INTRAMUSCULAR | Status: DC
  Filled 2016-05-15: qty 80

## 2016-05-15 MED ORDER — NALOXONE HCL 0.4 MG/ML IJ SOLN: 0.4000 mg | INTRAMUSCULAR | Status: DC | PRN

## 2016-05-15 MED ORDER — HYDRALAZINE HCL 20 MG/ML IJ SOLN: 5.0000 mg | INTRAMUSCULAR | Status: DC | PRN

## 2016-05-15 MED ORDER — MENTHOL 3 MG MT LOZG
1.0000 | LOZENGE | OROMUCOSAL | Status: DC | PRN
  Filled 2016-05-15: qty 9

## 2016-05-15 MED ORDER — FENTANYL CITRATE (PF) 100 MCG/2ML IJ SOLN
25.0000 ug | INTRAMUSCULAR | Status: DC | PRN
  Administered 2016-05-16: 25 ug via INTRAVENOUS
  Administered 2016-05-16: 50 ug via INTRAVENOUS

## 2016-05-15 MED ORDER — SOD CITRATE-CITRIC ACID 500-334 MG/5ML PO SOLN
30.0000 mL | Freq: Once | ORAL | Status: AC
  Administered 2016-05-15: 30 mL via ORAL
  Filled 2016-05-15: qty 30

## 2016-05-15 MED ORDER — ONDANSETRON HCL 4 MG/2ML IJ SOLN
INTRAMUSCULAR | Status: DC | PRN
  Administered 2016-05-15: 4 mg via INTRAVENOUS

## 2016-05-15 MED ORDER — OXYTOCIN 40 UNITS IN LACTATED RINGERS INFUSION - SIMPLE MED: 2.5000 [IU]/h | INTRAVENOUS | Status: AC

## 2016-05-15 MED ORDER — LACTATED RINGERS IV SOLN: INTRAVENOUS | Status: DC

## 2016-05-15 MED ORDER — OXYCODONE HCL 5 MG PO TABS
5.0000 mg | ORAL_TABLET | ORAL | Status: DC | PRN
  Administered 2016-05-17 (×3): 5 mg via ORAL
  Filled 2016-05-15 (×4): qty 1

## 2016-05-15 MED ORDER — ACETAMINOPHEN 325 MG PO TABS
650.0000 mg | ORAL_TABLET | ORAL | Status: DC | PRN
  Administered 2016-05-16: 650 mg via ORAL

## 2016-05-15 MED ORDER — FENTANYL CITRATE (PF) 100 MCG/2ML IJ SOLN
INTRAMUSCULAR | Status: DC | PRN
  Administered 2016-05-15: 12.5 ug via INTRATHECAL
  Administered 2016-05-15: 87.5 ug via INTRAVENOUS

## 2016-05-15 MED ORDER — NALBUPHINE HCL 10 MG/ML IJ SOLN: 5.0000 mg | INTRAMUSCULAR | Status: DC | PRN

## 2016-05-15 MED ORDER — PHENYLEPHRINE 8 MG IN D5W 100 ML (0.08MG/ML) PREMIX OPTIME
INJECTION | INTRAVENOUS | Status: AC
  Filled 2016-05-15: qty 100

## 2016-05-15 MED ORDER — SIMETHICONE 80 MG PO CHEW
80.0000 mg | CHEWABLE_TABLET | ORAL | Status: DC | PRN
  Administered 2016-05-17: 80 mg via ORAL
  Filled 2016-05-15 (×2): qty 1

## 2016-05-15 MED ORDER — MAGNESIUM SULFATE BOLUS VIA INFUSION
4.0000 g | Freq: Once | INTRAVENOUS | Status: AC
  Administered 2016-05-15: 4 g via INTRAVENOUS
  Filled 2016-05-15: qty 500

## 2016-05-15 MED ORDER — PRENATAL MULTIVITAMIN CH
1.0000 | ORAL_TABLET | Freq: Every day | ORAL | Status: DC
  Administered 2016-05-16 – 2016-05-17 (×2): 1 via ORAL
  Filled 2016-05-15 (×2): qty 1

## 2016-05-15 MED ORDER — FENTANYL CITRATE (PF) 100 MCG/2ML IJ SOLN
INTRAMUSCULAR | Status: AC
  Filled 2016-05-15: qty 2

## 2016-05-15 MED ORDER — DIPHENHYDRAMINE HCL 25 MG PO CAPS
25.0000 mg | ORAL_CAPSULE | Freq: Four times a day (QID) | ORAL | Status: DC | PRN
  Filled 2016-05-15: qty 1

## 2016-05-15 MED ORDER — LACTATED RINGERS IV SOLN
INTRAVENOUS | Status: DC | PRN
  Administered 2016-05-15: 22:00:00 via INTRAVENOUS

## 2016-05-15 MED ORDER — SIMETHICONE 80 MG PO CHEW
80.0000 mg | CHEWABLE_TABLET | Freq: Three times a day (TID) | ORAL | Status: DC
  Administered 2016-05-16 – 2016-05-18 (×5): 80 mg via ORAL
  Filled 2016-05-15 (×8): qty 1

## 2016-05-15 MED ORDER — SODIUM CHLORIDE 0.9 % IV SOLN: INTRAVENOUS | Status: DC

## 2016-05-15 MED ORDER — KETOROLAC TROMETHAMINE 30 MG/ML IJ SOLN: 30.0000 mg | Freq: Four times a day (QID) | INTRAMUSCULAR | Status: AC | PRN

## 2016-05-15 MED ORDER — LACTATED RINGERS IV SOLN
INTRAVENOUS | Status: DC | PRN
  Administered 2016-05-15 (×2): via INTRAVENOUS

## 2016-05-15 MED ORDER — CEFAZOLIN SODIUM-DEXTROSE 2-4 GM/100ML-% IV SOLN
2.0000 g | INTRAVENOUS | Status: AC
  Administered 2016-05-15: 2 g via INTRAVENOUS
  Filled 2016-05-15: qty 100

## 2016-05-15 MED ORDER — OXYTOCIN 10 UNIT/ML IJ SOLN
INTRAMUSCULAR | Status: AC
  Filled 2016-05-15: qty 4

## 2016-05-15 MED ORDER — KETOROLAC TROMETHAMINE 30 MG/ML IJ SOLN
INTRAMUSCULAR | Status: AC
  Administered 2016-05-15: 30 mg via INTRAVENOUS
  Filled 2016-05-15: qty 1

## 2016-05-15 MED ORDER — DIPHENHYDRAMINE HCL 25 MG PO CAPS
25.0000 mg | ORAL_CAPSULE | ORAL | Status: DC | PRN
  Filled 2016-05-15: qty 1

## 2016-05-15 MED ORDER — METOCLOPRAMIDE HCL 5 MG/ML IJ SOLN
INTRAMUSCULAR | Status: DC | PRN
  Administered 2016-05-15 (×2): 5 mg via INTRAVENOUS

## 2016-05-15 MED ORDER — ZOLPIDEM TARTRATE 5 MG PO TABS
5.0000 mg | ORAL_TABLET | Freq: Every evening | ORAL | Status: DC | PRN
  Filled 2016-05-15: qty 1

## 2016-05-15 MED ORDER — TETANUS-DIPHTH-ACELL PERTUSSIS 5-2.5-18.5 LF-MCG/0.5 IM SUSP
0.5000 mL | Freq: Once | INTRAMUSCULAR | Status: DC
  Filled 2016-05-15: qty 0.5

## 2016-05-15 MED ORDER — OXYCODONE HCL 5 MG PO TABS
10.0000 mg | ORAL_TABLET | ORAL | Status: DC | PRN
  Administered 2016-05-18 (×2): 10 mg via ORAL
  Filled 2016-05-15 (×3): qty 2

## 2016-05-15 MED ORDER — KETOROLAC TROMETHAMINE 30 MG/ML IJ SOLN
30.0000 mg | Freq: Four times a day (QID) | INTRAMUSCULAR | Status: AC | PRN
  Administered 2016-05-15: 30 mg via INTRAVENOUS

## 2016-05-15 MED ORDER — SIMETHICONE 80 MG PO CHEW
80.0000 mg | CHEWABLE_TABLET | ORAL | Status: DC
  Administered 2016-05-16 – 2016-05-17 (×2): 80 mg via ORAL
  Filled 2016-05-15 (×5): qty 1

## 2016-05-15 MED ORDER — DIPHENHYDRAMINE HCL 50 MG/ML IJ SOLN
INTRAMUSCULAR | Status: AC
  Filled 2016-05-15: qty 1

## 2016-05-15 MED ORDER — MORPHINE SULFATE-NACL 0.5-0.9 MG/ML-% IV SOSY
PREFILLED_SYRINGE | INTRAVENOUS | Status: AC
  Filled 2016-05-15: qty 1

## 2016-05-15 MED ORDER — PHENYLEPHRINE 40 MCG/ML (10ML) SYRINGE FOR IV PUSH (FOR BLOOD PRESSURE SUPPORT)
PREFILLED_SYRINGE | INTRAVENOUS | Status: AC
  Filled 2016-05-15: qty 10

## 2016-05-15 MED ORDER — OXYTOCIN 40 UNITS IN LACTATED RINGERS INFUSION - SIMPLE MED
INTRAVENOUS | Status: DC | PRN
  Administered 2016-05-15: 40 [IU] via INTRAVENOUS

## 2016-05-15 MED ORDER — ONDANSETRON HCL 4 MG/2ML IJ SOLN: 4.0000 mg | Freq: Three times a day (TID) | INTRAMUSCULAR | Status: DC | PRN

## 2016-05-15 MED ORDER — LACTATED RINGERS IV SOLN
INTRAVENOUS | Status: DC
  Administered 2016-05-16: 02:00:00 via INTRAVENOUS

## 2016-05-15 MED ORDER — DIPHENHYDRAMINE HCL 50 MG/ML IJ SOLN
INTRAMUSCULAR | Status: DC | PRN
  Administered 2016-05-15: 6.25 mg via INTRAVENOUS

## 2016-05-15 MED ORDER — SENNOSIDES-DOCUSATE SODIUM 8.6-50 MG PO TABS
2.0000 | ORAL_TABLET | ORAL | Status: DC
  Administered 2016-05-16 – 2016-05-18 (×3): 2 via ORAL
  Filled 2016-05-15 (×6): qty 2

## 2016-05-15 MED ORDER — CHLOROPROCAINE HCL (PF) 3 % IJ SOLN
INTRAMUSCULAR | Status: DC | PRN
  Administered 2016-05-15: 20 mL

## 2016-05-15 MED ORDER — MORPHINE SULFATE-NACL 0.5-0.9 MG/ML-% IV SOSY: PREFILLED_SYRINGE | INTRAVENOUS | Status: DC | PRN

## 2016-05-15 MED ORDER — SCOPOLAMINE 1 MG/3DAYS TD PT72
MEDICATED_PATCH | TRANSDERMAL | Status: DC | PRN
  Administered 2016-05-15: 1 via TRANSDERMAL

## 2016-05-15 MED ORDER — CHLOROPROCAINE HCL (PF) 3 % IJ SOLN
INTRAMUSCULAR | Status: AC
  Filled 2016-05-15: qty 20

## 2016-05-15 MED ORDER — DIPHENHYDRAMINE HCL 50 MG/ML IJ SOLN: 12.5000 mg | INTRAMUSCULAR | Status: DC | PRN

## 2016-05-15 MED ORDER — IBUPROFEN 600 MG PO TABS
600.0000 mg | ORAL_TABLET | Freq: Four times a day (QID) | ORAL | Status: DC
  Administered 2016-05-16 – 2016-05-18 (×9): 600 mg via ORAL
  Filled 2016-05-15 (×10): qty 1

## 2016-05-15 MED ORDER — COCONUT OIL OIL
1.0000 "application " | TOPICAL_OIL | Status: DC | PRN
  Administered 2016-05-16: 1 via TOPICAL
  Filled 2016-05-15 (×2): qty 120

## 2016-05-15 MED ORDER — SCOPOLAMINE 1 MG/3DAYS TD PT72
MEDICATED_PATCH | TRANSDERMAL | Status: AC
  Filled 2016-05-15: qty 1

## 2016-05-15 MED ORDER — MORPHINE SULFATE (PF) 0.5 MG/ML IJ SOLN
INTRAMUSCULAR | Status: DC | PRN
  Administered 2016-05-15: .3 mg via EPIDURAL
  Administered 2016-05-15: .2 mg via INTRATHECAL

## 2016-05-15 MED ORDER — NALOXONE HCL 2 MG/2ML IJ SOSY
1.0000 ug/kg/h | PREFILLED_SYRINGE | INTRAVENOUS | Status: DC | PRN
  Filled 2016-05-15: qty 2

## 2016-05-15 MED ORDER — DEXAMETHASONE SODIUM PHOSPHATE 4 MG/ML IJ SOLN
INTRAMUSCULAR | Status: DC | PRN
  Administered 2016-05-15: 4 mg via INTRAVENOUS

## 2016-05-15 SURGICAL SUPPLY — 44 items
APL SKNCLS STERI-STRIP NONHPOA (GAUZE/BANDAGES/DRESSINGS) ×1
BENZOIN TINCTURE PRP APPL 2/3 (GAUZE/BANDAGES/DRESSINGS) ×2 IMPLANT
CHLORAPREP W/TINT 26ML (MISCELLANEOUS) ×3 IMPLANT
CLAMP CORD UMBIL (MISCELLANEOUS) IMPLANT
CLOSURE WOUND 1/2 X4 (GAUZE/BANDAGES/DRESSINGS) ×1
CLOTH BEACON ORANGE TIMEOUT ST (SAFETY) ×3 IMPLANT
DRSG OPSITE POSTOP 4X10 (GAUZE/BANDAGES/DRESSINGS) ×3 IMPLANT
DRSG PAD ABDOMINAL 8X10 ST (GAUZE/BANDAGES/DRESSINGS) ×2 IMPLANT
ELECT REM PT RETURN 9FT ADLT (ELECTROSURGICAL) ×3
ELECTRODE REM PT RTRN 9FT ADLT (ELECTROSURGICAL) ×1 IMPLANT
EXTRACTOR VACUUM KIWI (MISCELLANEOUS) IMPLANT
GLOVE BIO SURGEON ST LM GN SZ9 (GLOVE) ×3 IMPLANT
GLOVE BIOGEL PI IND STRL 7.0 (GLOVE) ×1 IMPLANT
GLOVE BIOGEL PI IND STRL 9 (GLOVE) ×1 IMPLANT
GLOVE BIOGEL PI INDICATOR 7.0 (GLOVE) ×6
GLOVE BIOGEL PI INDICATOR 9 (GLOVE) ×2
GOWN STRL REUS W/TWL 2XL LVL3 (GOWN DISPOSABLE) ×3 IMPLANT
GOWN STRL REUS W/TWL LRG LVL3 (GOWN DISPOSABLE) ×3 IMPLANT
NDL HYPO 25X5/8 SAFETYGLIDE (NEEDLE) IMPLANT
NEEDLE HYPO 25X5/8 SAFETYGLIDE (NEEDLE) ×3 IMPLANT
NS IRRIG 1000ML POUR BTL (IV SOLUTION) ×3 IMPLANT
PACK C SECTION WH (CUSTOM PROCEDURE TRAY) ×3 IMPLANT
PAD OB MATERNITY 4.3X12.25 (PERSONAL CARE ITEMS) ×3 IMPLANT
PENCIL SMOKE EVAC W/HOLSTER (ELECTROSURGICAL) ×3 IMPLANT
RETAINER VISCERAL (MISCELLANEOUS) ×2 IMPLANT
RETRACTOR WND ALEXIS 25 LRG (MISCELLANEOUS) IMPLANT
RTRCTR C-SECT PINK 25CM LRG (MISCELLANEOUS) IMPLANT
RTRCTR C-SECT PINK 34CM XLRG (MISCELLANEOUS) IMPLANT
RTRCTR WOUND ALEXIS 25CM LRG (MISCELLANEOUS) ×3
SPONGE GAUZE 4X4 12PLY STER LF (GAUZE/BANDAGES/DRESSINGS) ×2 IMPLANT
STRIP CLOSURE SKIN 1/2X4 (GAUZE/BANDAGES/DRESSINGS) ×1 IMPLANT
SUT MNCRL 0 VIOLET CTX 36 (SUTURE) ×2 IMPLANT
SUT MONOCRYL 0 CTX 36 (SUTURE) ×4
SUT PLAIN 2 0 (SUTURE) ×3
SUT PLAIN ABS 2-0 CT1 27XMFL (SUTURE) IMPLANT
SUT VIC AB 0 CT1 27 (SUTURE) ×3
SUT VIC AB 0 CT1 27XBRD ANBCTR (SUTURE) ×1 IMPLANT
SUT VIC AB 2-0 CT1 27 (SUTURE) ×3
SUT VIC AB 2-0 CT1 TAPERPNT 27 (SUTURE) ×1 IMPLANT
SUT VIC AB 4-0 KS 27 (SUTURE) ×3 IMPLANT
SYR BULB IRRIGATION 50ML (SYRINGE) IMPLANT
SYR KIT LINE DRAW 1CC W/FILTR (LINER) ×2 IMPLANT
TOWEL OR 17X24 6PK STRL BLUE (TOWEL DISPOSABLE) ×3 IMPLANT
TRAY FOLEY CATH SILVER 14FR (SET/KITS/TRAYS/PACK) ×3 IMPLANT

## 2016-05-15 NOTE — Anesthesia Procedure Notes (Signed)
Spinal  Patient location during procedure: OR Staffing Anesthesiologist: Zhane Bluitt Performed: anesthesiologist  Preanesthetic Checklist Completed: patient identified, site marked, surgical consent, pre-op evaluation, timeout performed, IV checked, risks and benefits discussed and monitors and equipment checked Spinal Block Patient position: sitting Prep: ChloraPrep Patient monitoring: heart rate, continuous pulse ox and blood pressure Approach: right paramedian Location: L3-4 Injection technique: single-shot Needle Needle type: Sprotte  Needle gauge: 24 G Needle length: 9 cm Additional Notes Expiration date of kit checked and confirmed. Patient tolerated procedure well, without complications.       

## 2016-05-15 NOTE — Progress Notes (Signed)
FACULTY PRACTICE ANTEPARTUM(COMPREHENSIVE) NOTE  Suzanne [name] is a 40 y.o. 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   Fetal presentation is breech. Length of Stay:  3  Days  Subjective: Pt had BPP today showing RDF and Ductus Venosus abnormalities of flow, so recommendation for delivery made by MFM, and pt has agreed to recommended plan of care, which is NPO til 8 pm , initiation of Mag Sulfate for Neuroprotection, and Cesarean Delivery at 8PM as last food was chips at 2pm.  Patient reports the fetal movement as active. Patient reports uterine contraction  activity as none. Patient reports  vaginal bleeding as none. Patient describes fluid per vagina as None.  Vitals:  Blood pressure 122/74, pulse 72, temperature 98.5 F (36.9 C), temperature source Oral, resp. rate 16, height 5\' 6"  (1.676 m), weight 94.6 kg (208 lb 9 oz), last menstrual period 10/18/2015, unknown if currently breastfeeding. Physical Examination:  General appearance - alert, well appearing, and in no distress, oriented to person, place, and time and overweight Heart - normal rate and regular rhythm Abdomen - soft, nontender, nondistended Fundal Height:  size less than dates Cervical Exam: Not evaluated. Extremities: extremities normal, atraumatic, no cyanosis or edema and Homans sign is negative, no sign of DVT with DTRs 2+ bilaterally Membranes:intact  Fetal Monitoring:  Baseline: 145 bpm, Variability: Fair (1-6 bpm), Accelerations: Non-reactive but appropriate for gestational age and Decelerations: Absent  Labs:  No results found for this or any previous visit (from the past 24 hour(s)).  Imaging Studies:     Currently EPIC will not allow sonographic studies to automatically populate into notes.  In the meantime, copy and paste results into note or free text.  Medications:   Scheduled . aspirin  81 mg Oral Daily  . docusate sodium  100 mg Oral Daily  . labetalol  200 mg Oral BID  . magnesium  4 g Intravenous Once  . pantoprazole  40 mg Oral Daily  . prenatal multivitamin  1 tablet Oral Q1200  . sodium chloride flush  3 mL Intravenous Q12H   I have reviewed the patient's current medications.  ASSESSMENT: Patient Active Problem List   Diagnosis Date Noted  . IUGR (intrauterine growth restriction) affecting care of mother 05/11/2016  . Supervision of high risk pregnancy, antepartum 01/09/2016  . AMA (advanced maternal age) multigravida 2540+ 01/09/2016  . GERD (gastroesophageal reflux disease) 01/09/2016  . Hypertension, benign 01/09/2016  . Hypertension in pregnancy, essential, antepartum 01/09/2016    PLAN: Patient has had cesarean delivery recommended, and she accepts this advice, has signed consents, and will have primary cesarean at 8 pm .  Mag Sulfate initiated for Neuroprotection. Risks, benefits, rationale for c/s delivery reviewed with the patient who agrees to recommended management. Tilda BurrowFERGUSON,Temeka Pore V 05/15/2016,6:20 PM    Patient ID: Suzanne [name], female   DOB: [birth year], 40 y.o.   MRN: [id number]

## 2016-05-15 NOTE — Op Note (Signed)
Cesarean Section Operative Report  Suzanne StableJamila Hilmes  05/11/2016 - 05/15/2016  Indications: Breech Presentation and IUGR and Non-reassuring fetal testing   Pre-operative Diagnosis: cesarean section IUGR, breech presentation, non-reassuring fetal testing.   Post-operative Diagnosis: Same   Surgeon: Surgeon(s) and Role:    * Tilda BurrowJohn V Ferguson, MD - Primary    * Michaele OfferElizabeth Woodland Kelyn Ponciano, DO - Fellow   Attending Attestation: I was present and scrubbed for the entire procedure.   Assistants: Cleda ClarksElizabeth W Destry Dauber, DO  Anesthesia: spinal    Estimated Blood Loss: 750 ml  Total IV Fluids: 2600 ml LR  Urine Output:: 800 ml clear urine  Specimens: Placenta  Findings: Viable female infant in frank breech presentation; Apgars 1/2; weight 780 g; arterial cord pH pending; Clear amniotic fluid; intact placenta with three vessel cord; normal uterus, fallopian tubes and ovaries bilaterally.  Baby condition / location:  NICU   Complications: no complications  Indications: Suzanne [name] is a 40 y.o. [id number] with an IUP 858w0d presenting for IUGR and reverse end diastolic flow.  The risks, benefits, complications, treatment options, and exected outcomes were discussed with the patient . The patient dwith the proposed plan, giving informed consent. identified as Suzanne [name] and the procedure verified as C-Section Delivery.  Procedure Details:  The patient was taken back to the operative suite where spinal anesthesia was placed.  A time out was held and the above information confirmed.   After induction of anesthesia, the patient was draped and prepped in the usual sterile manner and placed in a dorsal supine position with a leftward tilt. A Pfannenstiel incision was made and carried down through the subcutaneous tissue to the fascia. Fascial incision was made and extended transversely. The fascia was separated from the underlying rectus tissue superiorly and inferiorly. The peritoneum was  identified and bluntly entered and extended longitudinally. Alexis retractor was placed. A low transverse uterine incision was made and extended bluntly. Delivered from breech frank presentation was a viable infant with Apgars and weight as above.  The umbilical cord was clamped and cut immediately, cord blood was obtained for evaluation. Cord ph was sent. The placenta was removed Intact and appeared normal for gestational age. The uterine outline, tubes and ovaries appeared normal. The uterine incision was closed with running locked sutures of 0 Monocryl with an imbricating layer of the same.   Hemostasis was observed. The peritoneum was closed with 2-0 Plain gut. The rectus muscles were examined and hemostasis observed. The fascia was then reapproximated with running sutures of 0 Vicryl. The subcuticular closure was performed using 2-0plain gut. The skin was closed with 4-0Vicryl.   Instrument, sponge, and needle counts were correct prior the abdominal closure and were correct at the conclusion of the case.     Disposition: PACU - hemodynamically stable.   Maternal Condition: stable  Baby to NICU.       SignedJustice [name]: Suzanne Fetty MumawDO 05/15/2016 10:59 PM

## 2016-05-15 NOTE — Anesthesia Preprocedure Evaluation (Signed)
Anesthesia Evaluation  Patient identified by MRN, date of birth, ID band Patient awake    Reviewed: Allergy & Precautions, NPO status , Patient's Chart, lab work & pertinent test results  Airway Mallampati: II  TM Distance: >3 FB Neck ROM: Full    Dental no notable dental hx.    Pulmonary neg pulmonary ROS, former smoker,    Pulmonary exam normal breath sounds clear to auscultation       Cardiovascular hypertension, Pt. on medications and Pt. on home beta blockers Normal cardiovascular exam Rhythm:Regular Rate:Normal     Neuro/Psych negative neurological ROS  negative psych ROS   GI/Hepatic negative GI ROS, Neg liver ROS,   Endo/Other  negative endocrine ROS  Renal/GU negative Renal ROS  negative genitourinary   Musculoskeletal negative musculoskeletal ROS (+)   Abdominal   Peds negative pediatric ROS (+)  Hematology negative hematology ROS (+)   Anesthesia Other Findings   Reproductive/Obstetrics (+) Pregnancy                             Anesthesia Physical Anesthesia Plan  ASA: II  Anesthesia Plan: Spinal   Post-op Pain Management:    Induction: Intravenous  Airway Management Planned: Simple Face Mask  Additional Equipment:   Intra-op Plan:   Post-operative Plan:   Informed Consent: I have reviewed the patients History and Physical, chart, labs and discussed the procedure including the risks, benefits and alternatives for the proposed anesthesia with the patient or authorized representative who has indicated his/her understanding and acceptance.   Dental advisory given  Plan Discussed with: CRNA  Anesthesia Plan Comments:         Anesthesia Quick Evaluation

## 2016-05-15 NOTE — Progress Notes (Signed)
Patient ID: Suzanne [name], female   DOB: 04-28-1976, 40 y.o.   MRN: [id number] ACULTY PRACTICE ANTEPARTUM COMPREHENSIVE PROGRESS NOTE  Suzanne [name] is a 40 y.o. G2P0010 at 7859w0d  who is admitted for IUGR.   Fetal presentation is cephalic. Length of Stay:  3  Days  Subjective: Pt denies problems overnight Patient reports good fetal movement.  She reports no uterine contractions, no bleeding and no loss of fluid per vagina.  Vitals:  Blood pressure 126/72, pulse 77, temperature 97.7 F (36.5 C), temperature source Oral, resp. rate 18, height 5\' 6"  (1.676 m), weight 208 lb 9 oz (94.6 kg), last menstrual period 10/18/2015, unknown if currently breastfeeding. Physical Examination: General appearance - alert, well appearing, and in no distress Cervical Exam: . Extremities: extremities normal, atraumatic, no cyanosis or edema  Membranes:intact  Fetal Monitoring:  Baseline: 140's bpm, Variability: Fair (1-6 bpm), Accelerations: Non-reactive but appropriate for gestational age, Decelerations: Absent and toco: no ctx  Labs:  Results for orders placed or performed during the hospital encounter of 05/11/16 (from the past 24 hour(s))  [facility]   Collection Time: 05/14/16  2:22 PM  Result Value Ref Range   ABO/RH(D) B POS    Antibody Screen NEG    Sample Expiration 05/17/2016     Imaging Studies:    10/19 BPP 8/8 No reverse EDF on UA   Medications:  Scheduled . aspirin  81 mg Oral Daily  . docusate sodium  100 mg Oral Daily  . labetalol  200 mg Oral BID  . pantoprazole  40 mg Oral Daily  . prenatal multivitamin  1 tablet Oral Q1200  . sodium chloride flush  3 mL Intravenous Q12H   I have reviewed the patient's current medications.  ASSESSMENT: Patient Active Problem List   Diagnosis Date Noted  . IUGR (intrauterine growth restriction) affecting care of mother 05/11/2016  . Supervision of high risk pregnancy, antepartum 01/09/2016  . AMA  (advanced maternal age) multigravida 4840+ 01/09/2016  . GERD (gastroesophageal reflux disease) 01/09/2016  . Hypertension, benign 01/09/2016  . Hypertension in pregnancy, essential, antepartum 01/09/2016    PLAN: BP is stable, continue labetalol resolved REDF, reassuring short term testing with BPP 8/8 and reassuring FHR on monitoring. Suspect due to steroid benefit.  Continue current plan for now. Delivery with short term acute changes to FHR or change in BPP--recheck in 2 days 10/21 Continue inpatient monitoring. NICU aware and holding bed for this patient. Complete BMZ, 2nd course. Continue routine antenatal care. Dr. Sherrie Georgeecker evaluated and spoke to pt earlier today. Pt comfortable with plan.   Pt will need Magnesium sulfate for neuroprotection to start once it is determined that pt will proceed to delivery. NPO overnight in case reverse flow returns and delivery needed.  Pt aware. Continue routine antenatal care.   HARRAWAY-SMITH, Aseem Sessums 05/15/2016,8:01 AM

## 2016-05-15 NOTE — Transfer of Care (Signed)
Immediate Anesthesia Transfer of Care Note  Patient: Mariea StableJamila Rys  Procedure(s) Performed: Procedure(s): CESAREAN SECTION (N/A)  Patient Location: PACU  Anesthesia Type:Spinal  Level of Consciousness: awake, alert  and oriented  Airway & Oxygen Therapy: Patient Spontanous Breathing  Post-op Assessment: Report given to RN and Post -op Vital signs reviewed and stable  Post vital signs: Reviewed and stable  Last Vitals:  Vitals:   05/15/16 2005 05/15/16 2047  BP: 138/87 (!) 143/86  Pulse: 73 71  Resp:    Temp:  37.1 C    Last Pain:  Vitals:   05/15/16 2047  TempSrc: Oral  PainSc:       Patients Stated Pain Goal: 3 (05/13/16 2104)  Complications: No apparent anesthesia complications

## 2016-05-16 ENCOUNTER — Encounter (HOSPITAL_COMMUNITY): Payer: Self-pay

## 2016-05-16 LAB — CBC
HCT: 32.1 % — ABNORMAL LOW (ref 36.0–46.0)
Hemoglobin: 10.9 g/dL — ABNORMAL LOW (ref 12.0–15.0)
MCH: 35.7 pg — ABNORMAL HIGH (ref 26.0–34.0)
MCHC: 34 g/dL (ref 30.0–36.0)
MCV: 105.2 fL — ABNORMAL HIGH (ref 78.0–100.0)
Platelets: 223 K/uL (ref 150–400)
RBC: 3.05 MIL/uL — ABNORMAL LOW (ref 3.87–5.11)
RDW: 13.3 % (ref 11.5–15.5)
WBC: 19.8 K/uL — ABNORMAL HIGH (ref 4.0–10.5)

## 2016-05-16 MED ORDER — DIBUCAINE 1 % RE OINT: 1.0000 "application " | TOPICAL_OINTMENT | RECTAL | Status: DC | PRN

## 2016-05-16 MED ORDER — WITCH HAZEL-GLYCERIN EX PADS: 1.0000 "application " | MEDICATED_PAD | CUTANEOUS | Status: DC | PRN

## 2016-05-16 MED ORDER — LISINOPRIL 10 MG PO TABS
10.0000 mg | ORAL_TABLET | Freq: Every day | ORAL | Status: DC
  Administered 2016-05-16 – 2016-05-18 (×3): 10 mg via ORAL
  Filled 2016-05-16 (×4): qty 1

## 2016-05-16 MED ORDER — FENTANYL CITRATE (PF) 100 MCG/2ML IJ SOLN
INTRAMUSCULAR | Status: AC
  Administered 2016-05-16: 25 ug via INTRAVENOUS
  Filled 2016-05-16: qty 2

## 2016-05-16 MED ORDER — LACTATED RINGERS IV BOLUS (SEPSIS): 1000.0000 mL | Freq: Once | INTRAVENOUS | Status: DC

## 2016-05-16 MED ORDER — HYDROCHLOROTHIAZIDE 25 MG PO TABS
25.0000 mg | ORAL_TABLET | Freq: Every day | ORAL | Status: DC
  Administered 2016-05-16 – 2016-05-18 (×3): 25 mg via ORAL
  Filled 2016-05-16 (×3): qty 1

## 2016-05-16 NOTE — Clinical Social Work Maternal (Signed)
  CLINICAL SOCIAL WORK MATERNAL/CHILD NOTE  Patient Details  Name: Suzanne [name] MRN: [id number] Date of Birth: Feb 20, 1976  Date:  05/16/2016  Clinical Social Worker Initiating Note:  Ferdinand Lango Moselle Rister, MSW, LCSW-A Date/ Time Initiated:  05/16/16/1226     Child's Name:  Florestine Avers    Legal Guardian:  Mother   Need for Interpreter:  None   Date of Referral:  05/15/16     Reason for Referral:  Other (Comment) (MOB had questions regarding [facility] )   Referral Source:  Physician   Address:  [address] [place], [place] [address]  Phone number:  [phone]   Household Members:  Self   Natural Supports (not living in the home):  Immediate Family, Parent, Spouse/significant other   Professional Supports: None   Employment: Unemployed   Type of Work:     Education:  9 to 11 years   Museum/gallery curator Resources:  Medicaid   Other Resources:      Cultural/Religious Considerations Which May Impact Care:  None reported at this time.   Strengths:  Ability to meet basic needs , Compliance with medical plan , Home prepared for child    Risk Factors/Current Problems:  None   Cognitive State:  Alert , Goal Oriented , Insightful    Mood/Affect:  Calm , Comfortable , Interested    CSW Assessment: CSW met with MOB at bedside to complete assessment. MOB was warm and inviting. This Probation officer explained role and reasoning for visit being due to her request for information regarding [place]. At this time MOB noted she has been trying to apply for [facility]; however, has been unsuccessful. This Probation officer inquired when she last tried and MOB noted the entire last month and a half. This Probation officer explained to MOB that [facility] DSS-WIC office does not allow you to apply for [facility] until after you have given birth and are d/c form the hospital. MOB verbalized understanding and noted that she gave birth yesterday at [redacted] weeks gestation and her baby is in the NICU due to  Birth weight of 1lb and 11oz. This Probation officer  informed MOB of the option to apply for SSI. MOB requested to have information regarding that.   This Probation officer provided MOB with resources pertaining to [place], pediatrician list, and parenting resources pamphlet. MOB noted that she has recently stopped working and is wondering if there is assistance out there to help pay her electricity bill. This Probation officer informed her of the [employer], low-income families energy project in addition to [employer]. This Probation officer explained to MOB that all the information in regards to agency's that can assist are outlined in the pamphlet provided. MOB was very thankful and states she has no further needs. She notes her support system is awesome and she feels very supported and is excited to be a new mom. CSW has no additional concerns. This Probation officer informed MOB she is available should any needs/additional support arise.   CSW Plan/Description:  Information/Referral to [employer], MSW, [facility]  Office: [phone]

## 2016-05-16 NOTE — Progress Notes (Signed)
   05/16/16 0054  Vitals  Temp 98.7 F (37.1 C)  Temp Source Oral  BP 127/76  BP Location Left Arm  BP Method Automatic  Patient Position (if appropriate) Lying  Pulse Rate 69  Pulse Rate Source Monitor  Resp 16  Oxygen Therapy  SpO2 96 %  O2 Device Room Air  Pt returned from PACU, awake, alert and oriented x 4. Pt denies pain or discomfort, oriented to room, call bell in close reach, foley cath and SCDs in place.

## 2016-05-16 NOTE — Progress Notes (Signed)
Subjective: Postpartum Day 1: Cesarean Delivery Patient reports incisional pain and tolerating PO.    Objective: Vital signs in last 24 hours: Temp:  [97.4 F (36.3 C)-98.7 F (37.1 C)] 98.7 F (37.1 C) (10/21 0054) Pulse Rate:  [60-79] 68 (10/21 0600) Resp:  [11-21] 16 (10/21 0600) BP: (116-146)/(66-108) 118/73 (10/21 0600) SpO2:  [96 %-100 %] 100 % (10/21 0600)  Intake/Output Summary (Last 24 hours) at 05/16/16 0744 Last data filed at 05/16/16 0600  Gross per 24 hour  Intake          4017.25 ml  Output             2500 ml  Net          1517.25 ml  urine appears concentrated.  Physical Exam:  General: alert, cooperative and no distress Lochia: appropriate Uterine Fundus: firm difficult to feel, small Incision: no significant drainage, dressing dry DVT Evaluation: No evidence of DVT seen on physical exam.   Recent Labs  05/15/16 1854 05/16/16 0549  HGB 11.7* 10.9*  HCT 34.9* 32.1*    Assessment/Plan: Status post Cesarean section. Doing well postoperatively.  Continue current care fluid bolus then saline lock iv.Tilda Burrow.  Alyan Hartline V 05/16/2016, 7:44 AM

## 2016-05-16 NOTE — Progress Notes (Signed)
Assessed mother for needs with breast pump due to infant in NICU.  Mother states that she has pumped 3 times today.  Assisted mother with pumping.  No colostrum obtained.  Nipples intact and no complaints of pain with pumping.  Coconut oil given.  Education and "Providing Breastmilk to Your Baby in the NICU" resource provided.  Mother verbalizes understanding and states that she plans to pump every 3 hours for breast stimulation.   Reynold BowenSusan Paisley Kambree Krauss, RN 05/16/2016 9:40 PM

## 2016-05-17 NOTE — Progress Notes (Deleted)
Dressing changed per order. Incision is clean and well approximated. No concerns noted. Carmelina DaneERRI L Jourdain Guay, RN Noted entered on wrong patient. Please disregard. Carmelina DaneERRI L Analia Zuk, RN

## 2016-05-17 NOTE — Plan of Care (Signed)
Problem: Physical Regulation: Goal: Ability to maintain clinical measurements within normal limits will improve Outcome: Completed/Met Date Met: 05/17/16 VSS Vaginal bleeding WNL

## 2016-05-17 NOTE — Lactation Note (Addendum)
This note was copied from a baby's chart. Lactation Consultation Note  Patient Name: Girl Mariea StableJamila Nibert DDUKG'UToday's Date: 05/17/2016 Reason for consult: Follow-up assessment   Follow up assessment with first time mom of NICU infant. Infant born at 3830 w GA and SGA. Maternal history of Chronic HTN.   Mom reports she pumped around midnight followed by hand expression. She reports she did not get any colostrum. Discussed supply and demand and milk coming to volume. Enc mom to pump every 2-3 hours with DEBP on Initiate setting for 15 minutes followed by hand expression. Mom reports she was shown how to hand express. Enc mom to take a 6 hour stretch at night with no pumping to rest. Enc mom to pump after visiting infant. Mom voiced understanding.  NICU Breast milk storage guidelines reviewed.   [facility] She reports she has called and has not been able to speak to anyone yet. [facility] [place] [facility] Pump loaner program reviewed with mom. Mom does have BCBS, enc her to call them and ask if her insurance provides a breastpump. Recommended hospital grade DEBP for at least 2 week until milk supply is established.   Enc mom to call with questions/concerns prn. Follow up tomorrow and prn.      Maternal Data Formula Feeding for Exclusion: No Has patient been taught Hand Expression?: Yes Does the patient have breastfeeding experience prior to this delivery?: No  Feeding    LATCH Score/Interventions                      Lactation Tools Discussed/Used WIC Program: No (Wants to apply) Pump Review: Setup, frequency, and cleaning;Milk Storage Initiated by:: Reviewed   Consult Status Consult Status: Follow-up Date: 05/18/16 Follow-up type: In-patient    Silas FloodSharon S Zachariah Pavek 05/17/2016, 9:50 AM

## 2016-05-17 NOTE — Progress Notes (Signed)
Subjective: Postpartum Day 2: Cesarean Delivery at 2674w0d due to reverse flow of umbilical artery dopplers in setting of CHTN and IUGR. Patient reports incisional pain and tolerating PO.  +Flatus. Ambulating. Baby doing okay in NICU. Breastfeeding.   Objective: Vital signs in last 24 hours: Temp:  [97.6 F (36.4 C)-99.5 F (37.5 C)] 97.6 F (36.4 C) (10/22 0550) Pulse Rate:  [69-78] 69 (10/22 0550) Resp:  [16-18] 18 (10/22 0550) BP: (108-137)/(76-87) 137/83 (10/22 0550) SpO2:  [96 %-100 %] 96 % (10/22 0550)  Intake/Output Summary (Last 24 hours) at 05/17/16 0637 Last data filed at 05/16/16 2215  Gross per 24 hour  Intake             1600 ml  Output             1200 ml  Net              400 ml  urine appears concentrated.  Physical Exam:  General: alert, cooperative and no distress Lochia: appropriate Uterine Fundus: firm difficult to feel, small Incision: no significant drainage, dressing dry DVT Evaluation: No evidence of DVT seen on physical exam.   Recent Labs  05/15/16 1854 05/16/16 0549  HGB 11.7* 10.9*  HCT 34.9* 32.1*    Assessment/Plan: Status post Cesarean section. Doing well postoperatively.  Stable BP, no meds needed Continue current care. Likely discharge tomorrow.   Tereso NewcomerANYANWU,UGONNA A, MD 05/17/2016, 6:37 AM

## 2016-05-18 ENCOUNTER — Ambulatory Visit (HOSPITAL_COMMUNITY): Admission: RE | Admit: 2016-05-18 | Payer: BLUE CROSS/BLUE SHIELD | Source: Ambulatory Visit

## 2016-05-18 MED ORDER — AMLODIPINE BESYLATE 10 MG PO TABS: 10.0000 mg | ORAL_TABLET | Freq: Every day | ORAL | 1 refills | Status: DC

## 2016-05-18 MED ORDER — OXYCODONE HCL 5 MG PO TABS: 5.0000 mg | ORAL_TABLET | ORAL | 0 refills | Status: DC | PRN

## 2016-05-18 NOTE — Lactation Note (Signed)
This note was copied from a baby's chart. Lactation Consultation Note  Patient Name: Suzanne [name] BOMQT'T Date: 05/18/2016 Reason for consult: Follow-up assessment;NICU baby;Infant < 6lbs   Follow up with mom prior to d/c. Mom reports she has a [facility] appt tomorrow at 1330. She declined breast pump rental. Showed her how to use single and double manual pumps in kit. Enc her to use NICU pumps if visiting prior to [facility]. Mom voiced understanding. Enc mom to call [facility] with questions/concerns prn. Follow up in NICU prn.    Maternal Data Formula Feeding for Exclusion: No Does the patient have breastfeeding experience prior to this delivery?: No  Feeding    LATCH Score/Interventions                      Lactation Tools Discussed/Used Pump Review: Setup, frequency, and cleaning;Milk Storage   Consult Status Consult Status: PRN Follow-up type: Call as needed    Donn Pierini 05/18/2016, 4:06 PM

## 2016-05-18 NOTE — Discharge Instructions (Signed)
Cesarean Delivery, Care After  Refer to this sheet in the next few weeks. These instructions provide you with information on caring for yourself after your procedure. Your health care provider may also give you specific instructions. Your treatment has been planned according to current medical practices, but problems sometimes occur. Call your health care provider if you have any problems or questions after you go home.  HOME CARE INSTRUCTIONS   Only take over-the-counter or prescription medications as directed by your health care provider.   Do not drink alcohol, especially if you are breastfeeding or taking medication to relieve pain.   Do not chew or smoke tobacco.   Continue to use good perineal care. Good perineal care includes:    Wiping your perineum from front to back.    Keeping your perineum clean.   Check your surgical cut (incision) daily for increased redness, drainage, swelling, or separation of skin.   Clean your incision gently with soap and water every day, and then pat it dry. If your health care provider says it is okay, leave the incision uncovered. Use a bandage (dressing) if the incision is draining fluid or appears irritated. If the adhesive strips across the incision do not fall off within 7 days, carefully peel them off.   Hug a pillow when coughing or sneezing until your incision is healed. This helps to relieve pain.   Do not use tampons or douche until your health care provider says it is okay.   Shower, wash your hair, and take tub baths as directed by your health care provider.   Wear a well-fitting bra that provides breast support.   Limit wearing support panties or control-top hose.   Drink enough fluids to keep your urine clear or pale yellow.   Eat high-fiber foods such as whole grain cereals and breads, brown rice, beans, and fresh fruits and vegetables every day. These foods may help prevent or relieve constipation.   Resume activities such as climbing stairs,  driving, lifting, exercising, or traveling as directed by your health care provider.   Talk to your health care provider about resuming sexual activities. This is dependent upon your risk of infection, your rate of healing, and your comfort and desire to resume sexual activity.   Try to have someone help you with your household activities and your newborn for at least a few days after you leave the hospital.   Rest as much as possible. Try to rest or take a nap when your newborn is sleeping.   Increase your activities gradually.   Keep all of your scheduled postpartum appointments. It is very important to keep your scheduled follow-up appointments. At these appointments, your health care provider will be checking to make sure that you are healing physically and emotionally.  SEEK MEDICAL CARE IF:    You are passing large clots from your vagina. Save any clots to show your health care provider.   You have a foul smelling discharge from your vagina.   You have trouble urinating.   You are urinating frequently.   You have pain when you urinate.   You have a change in your bowel movements.   You have increasing redness, pain, or swelling near your incision.   You have pus draining from your incision.   Your incision is separating.   You have painful, hard, or reddened breasts.   You have a severe headache.   You have blurred vision or see spots.   You feel sad   or depressed.   You have thoughts of hurting yourself or your newborn.   You have questions about your care, the care of your newborn, or medications.   You are dizzy or light-headed.   You have a rash.   You have pain, redness, or swelling at the site of the removed intravenous access (IV) tube.   You have nausea or vomiting.   You stopped breastfeeding and have not had a menstrual period within 12 weeks of stopping.   You are not breastfeeding and have not had a menstrual period within 12 weeks of delivery.   You have a fever.  SEEK  IMMEDIATE MEDICAL CARE IF:   You have persistent pain.   You have chest pain.   You have shortness of breath.   You faint.   You have leg pain.   You have stomach pain.   Your vaginal bleeding saturates 2 or more sanitary pads in 1 hour.  MAKE SURE YOU:    Understand these instructions.   Will watch your condition.   Will get help right away if you are not doing well or get worse.     This information is not intended to replace advice given to you by your health care provider. Make sure you discuss any questions you have with your health care provider.     Document Released: 04/04/2002 Document Revised: 08/03/2014 Document Reviewed: 03/09/2012  Elsevier Interactive Patient Education 2016 Elsevier Inc.

## 2016-05-18 NOTE — Progress Notes (Addendum)
Patient discharged home with family. Discharge paperwork reviewed. Preeclampsia handout given and reviewed. Prescriptions given to patient. Baby-love visit scheduled. No questions at this time.

## 2016-05-18 NOTE — Discharge Summary (Signed)
OB Discharge Summary     Patient Name: Suzanne [name] DOB: 1975-12-26 MRN: [id number]  Date of admission: 05/11/2016 Delivering [name]: Suzanne [name]   Date of discharge: 05/18/2016  Admitting diagnosis: 29WKS MONITORING Intrauterine pregnancy: [redacted]w[redacted]d     Secondary diagnosis:  Principal Problem:   IUGR (intrauterine growth restriction) affecting care of mother Active Problems:   AMA (advanced maternal age) multigravida 40+   Hypertension in pregnancy, essential, antepartum  Additional problems: breech presentation     Discharge diagnosis: Preterm Pregnancy Delivered and IUGR, breech presentation                                                                                                Post partum procedures:  Augmentation:   Complications: None  Hospital course:  Sceduled C/S   40 y.o. yo G2P0111 at [redacted]w[redacted]d was admitted to the hospital 05/11/2016 for antenatal inpatient care. Physical  Suzanne [name] WUJ:[id number] DOB: Mar 23, 1976 DOA: 05/11/2016  Chief Complaint: reverse flow on umbilical doppler  HPI: Suzanne [name] is a 40 y.o. female G2P0010 with IUP at [redacted]w[redacted]d with CHTN. She was admitted on 9/26 with intermittent reverse flow at [facility]. She received BMZ x2 and was released with improvement of her dopplers. On admit,she had a growth [place] with umbilical dopplers, which showed IUGR with the baby measuring 26-27 weeks and persistent reverse flow. BPP was also done, which was 8/8. The patient was discussed with me by Dr Sherrie George of MFM, who recommended observation for 24 hours.  Suzanne [name] is a 40 y.o. 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   Fetal presentation is breech. Length of Stay:  3  Days  Subjective: Pt had BPP today showing RDF and Ductus Venosus abnormalities of flow, so  recommendation for delivery made by MFM, and pt has agreed to recommended plan of care, which is NPO til 8 pm , initiation of Mag Sulfate for Neuroprotection, and Cesarean Delivery at 8PM as last food was chips at 2pm. Cesarean section was at 8 pm on 10/20  Findings: Viable female infant in frank breech presentation; Apgars 1/2; weight 780 g; arterial cord pH pending; Clear amniotic fluid; intact placenta with three vessel cord; normal uterus, fallopian tubes and ovaries bilaterally.   Physical exam Vitals:   05/17/16 0550 05/17/16 1713 05/17/16 2253 05/18/16 0530  BP: 137/83 119/77 109/61 111/73  Pulse: 69 76 76 82  Resp: 18 17 18 20   Temp: 97.6 F (36.4 C) 99.3 F (37.4 C) 97.7 F (36.5 C) 98.9 F (37.2 C)  TempSrc: Oral  Oral Oral  SpO2: 96% 100% 100% 100%  Weight:      Height:       General: alert, cooperative and no distress Lochia: appropriate Uterine Fundus: firm Incision: Healing well with no significant drainage DVT Evaluation: No evidence of DVT seen on physical exam. Labs: Lab Results  Component Value Date   WBC 19.8 (H) 05/16/2016   HGB  10.9 (L) 05/16/2016   HCT 32.1 (L) 05/16/2016   MCV 105.2 (H) 05/16/2016   PLT 223 05/16/2016   CBC Latest Ref Rng & Units 05/16/2016 05/15/2016 05/11/2016  WBC 4.0 - 10.5 K/uL 19.8(H) 12.5(H) 8.8  Hemoglobin 12.0 - 15.0 g/dL 10.9(L) 11.7(L) 12.5  Hematocrit 36.0 - 46.0 % 32.1(L) 34.9(L) 37.0  Platelets 150 - 400 K/uL 223 227 224    CMP Latest Ref Rng & Units 04/03/2016  Glucose 65 - 104 mg/dL 74  BUN 7 - 25 mg/dL -  Creatinine 4.540.50 - 0.981.10 mg/dL -  Sodium 119135 - 147146 mmol/L -  Potassium 3.5 - 5.3 mmol/L -  Chloride 98 - 110 mmol/L -  CO2 20 - 31 mmol/L -  Calcium 8.6 - 10.2 mg/dL -  Total Protein 6.1 - 8.1 g/dL -  Total Bilirubin 0.2 - 1.2 mg/dL -  Alkaline Phos 33 - 829115 U/L -  AST 10 - 30 U/L -  ALT 6 - 29 U/L -    Discharge instruction: per After Visit Summary and "Baby and Me Booklet".  After visit meds:     Medication List    STOP taking these medications   aspirin 81 MG chewable tablet   labetalol 200 MG tablet Commonly known as:  NORMODYNE     TAKE these medications   amLODipine 10 MG tablet Commonly known as:  NORVASC Take 1 tablet (10 mg total) by mouth daily.   CONCEPT OB 130-92.4-1 MG Caps Take 1 capsule by mouth daily.   omeprazole 20 MG capsule Commonly known as:  PRILOSEC Take 20 mg by mouth daily.   oxyCODONE 5 MG immediate release tablet Commonly known as:  Oxy IR/ROXICODONE Take 1 tablet (5 mg total) by mouth every 4 (four) hours as needed (pain scale 4-7).       Diet: routine diet  Activity: Advance as tolerated. Pelvic rest for 6 weeks.   Outpatient follow up:6 weeks Follow up Appt:Future Appointments Date Time Provider Department Center  05/18/2016 9:45 AM [facility] US 2 [facility]   Follow up Visit:No Follow-up on file.  Postpartum contraception: Undecided  Newborn Data: Live born female  Birth Weight: 1 lb 11.5 oz (780 g) APGAR: 1, 2  Baby Feeding: Bottle Disposition:NICU   05/18/2016 Suzanne BurrowFERGUSON,Suzanne [name]

## 2016-05-18 NOTE — Progress Notes (Signed)
Dr. Emelda FearFerguson notified of the date for the patient's last RPR. No new orders at this time.

## 2016-05-18 NOTE — Lactation Note (Signed)
This note was copied from a baby's chart. Lactation Consultation Note  Patient Name: Girl Mariea StableJamila Durbin JXBJY'NToday's Date: 05/18/2016 Reason for consult: Follow-up assessment;Infant < 6lbs;NICU baby   Follow up with mom of 3359 hour old NICU infant. Mom reports she pumped last at 1 am and plans to pump in a few minutes. She is not receiving colostrum yet.  Enc mom to pump at least 8 x in 24 hour with a 6 hour stretch at night.  WIC loaner pump papers given with phone # to call when ready for pump rental. Mom without questions/concerns at this time.    Maternal Data Formula Feeding for Exclusion: No Has patient been taught Hand Expression?: Yes Does the patient have breastfeeding experience prior to this delivery?: No  Feeding    LATCH Score/Interventions                      Lactation Tools Discussed/Used WIC Program: No (Plans to apply) Pump Review: Setup, frequency, and cleaning;Milk Storage   Consult Status Consult Status: Follow-up Date: 05/18/16 Follow-up type: In-patient    Silas FloodSharon S Hice 05/18/2016, 9:28 AM

## 2016-05-19 NOTE — Anesthesia Postprocedure Evaluation (Signed)
Anesthesia Post Note  Patient: Suzanne [name]  Procedure(s) Performed: Procedure(s) (LRB): CESAREAN SECTION (N/A)  Patient location during evaluation: PACU Anesthesia Type: Spinal Level of consciousness: awake and alert Pain management: pain level controlled Vital Signs Assessment: post-procedure vital signs reviewed and stable Respiratory status: spontaneous breathing and respiratory function stable Cardiovascular status: blood pressure returned to baseline and stable Postop Assessment: no headache, no backache and spinal receding Anesthetic complications: no    Last Vitals:  Vitals:   05/17/16 2253 05/18/16 0530  BP: 109/61 111/73  Pulse: 76 82  Resp: 18 20  Temp: 36.5 C 37.2 C    Last Pain:  Vitals:   05/18/16 1706  TempSrc:   PainSc: 4                  Phillips Groutarignan, Kamauri Kathol

## 2016-05-20 ENCOUNTER — Ambulatory Visit: Payer: Self-pay

## 2016-05-20 NOTE — Lactation Note (Signed)
This note was copied from a baby's chart. Lactation Consultation Note  Patient Name: Suzanne [name] WUJWJ'XToday's Date: 05/20/2016 Reason for consult: Follow-up assessment   With this mom of a NICU baby, now 705 days old, and 30 5/7 weeks CGA. Mom has not been pumping consistently, and I was called to observe mom pumping, due to LMS. Mom has very large breast, that sit in her lap. She was pumping with 27 flanges, that were pulling on her areola. I decreased mom to 24 flanges, with a better fit, and advised mom to sue coconut oil around her nipple with each pumping. Mom was advised to pump at least 8 times a day, every 2-3 hours, around the clock. I also showed mom how to hand express, and she was able to express some transitional white m ilk. Mom expressed about 15 ml's of milk. I told her she still had time to increase her milk supply, with consistent pumping and hand expression, and staying hydrated. Mom admits to spending most of her time at her baby's bedside, not pumping or caring for herself, and not sleeping at night. I told mom if she did not also care for herself, she would not be able to make milk as well. Mom receptive to teaching, and knows to call for lactation with questions/concerns.    Maternal Data    Feeding Feeding Type: Donor Breast Milk Length of feed: 30 min  LATCH Score/Interventions       Type of Nipple: Everted at rest and after stimulation              Lactation Tools Discussed/Used Tools: Flanges Flange Size: 24 (decreased to 24 with coconut oil to lubricate)   Consult Status Consult Status: PRN Follow-up type: In-patient (NICU)    [name], [name] 05/20/2016, 5:00 PM

## 2016-05-28 ENCOUNTER — Ambulatory Visit: Payer: Self-pay

## 2016-05-28 NOTE — Lactation Note (Signed)
This note was copied from a baby's chart. Lactation Consultation Note  Patient Name: Suzanne [name] UJWJX'BToday's Date: 05/28/2016 Reason for consult: Follow-up assessment;NICU baby;Infant < 6lbs   Follow up with mom in NICU. Mom reports she is pumping every 2-3 hours and is getting 1/2-1 oz/pumping. She is using a Symphony pump. She reports she began drinking Mother's Milk Tea yesterday. Gave her Lactation Cookie recipe and Fenugreek Handout, advised mom to speak with OB before taking Fenugreek as she has a history of high BP. Discussed power pumping with mom to start doing once a day. Follow up with mom prn.    Maternal Data    Feeding    LATCH Score/Interventions                      Lactation Tools Discussed/Used Pump Review: Setup, frequency, and cleaning   Consult Status Consult Status: PRN Follow-up type: Call as needed    Ed BlalockSharon S Rayla Pember 05/28/2016, 2:32 PM

## 2016-06-08 ENCOUNTER — Ambulatory Visit: Payer: Self-pay

## 2016-06-08 NOTE — Lactation Note (Signed)
This note was copied from a baby's chart. Lactation Consultation Note  Patient Name: Suzanne [name] ZOXWR'UToday's Date: 06/08/2016 Reason for consult: Follow-up assessment   With this mom of a NICU baby, now 663 weeks old, and 33 3/7 weeks CGA. Mom is pumping every 2 hours during the day, and every 4 hours at night. She was expressing about 10 ml's, but recently is getting nothing. I observed mom pumping, and decreased her to 21 flanges, and has mom add hand expression, before and during pumping, and after. I advised mom to support her breasts with towel rolls or to try and make a hands free bra with one of her bras from home, so she can add positive pressure to her breast while pumping. Mom did express about 10 ml's of milk, and was happy for that. She will continue to add hand expression to pumping, stay hydrated and continue with her mother's milk tea.   Maternal Data    Feeding Feeding Type: Donor Breast Milk Length of feed: 90 min  LATCH Score/Interventions                      Lactation Tools Discussed/Used Flange Size: Other (comment) (decreased to 21 flanges with a good fit. )   Consult Status Consult Status: PRN Follow-up type: In-patient (NICU)    [name], Sonyia Muro Anne 06/08/2016, 11:57 AM

## 2016-06-29 ENCOUNTER — Ambulatory Visit: Payer: Self-pay | Admitting: Medical

## 2016-06-30 ENCOUNTER — Ambulatory Visit: Payer: Self-pay

## 2016-06-30 ENCOUNTER — Ambulatory Visit (INDEPENDENT_AMBULATORY_CARE_PROVIDER_SITE_OTHER): Payer: BLUE CROSS/BLUE SHIELD | Admitting: Obstetrics and Gynecology

## 2016-06-30 ENCOUNTER — Encounter: Payer: Self-pay | Admitting: Obstetrics and Gynecology

## 2016-06-30 DIAGNOSIS — Z3042 Encounter for surveillance of injectable contraceptive: Secondary | ICD-10-CM | POA: Diagnosis not present

## 2016-06-30 MED ORDER — MEDROXYPROGESTERONE ACETATE 150 MG/ML IM SUSP
150.0000 mg | Freq: Once | INTRAMUSCULAR | Status: AC
  Administered 2016-06-30: 150 mg via INTRAMUSCULAR

## 2016-06-30 NOTE — Progress Notes (Signed)
Subjective:     Suzanne [name] is a 40 y.o. female who presents for a postpartum visit. She is 6 weeks postpartum following a PLTCS. I have fully reviewed the prenatal and intrapartum course. The delivery was at 29 gestational weeks. Outcome: primary cesarean section, low transverse incision. Anesthesia: Epidual. Postpartum course has been uncomplicated. Baby's course has been complicated by NICU stay. Baby is feeding by bottle. Bleeding some at this time. Bowel function is normal. Bladder function is normal. Patient is not sexually active. Contraception method is none at this time but desires depo-provera. Postpartum depression screening:  The following portions of the patient's history were reviewed and updated as appropriate: past family history, past medical history and problem list.  Review of Systems Pertinent items are noted in HPI.   Objective:    BP (!) 151/86   Pulse 73   Wt 205 lb 9.6 oz (93.3 kg)   BMI 33.18 kg/m   General:  alert, cooperative and appears stated age   Breasts:  defered in breast feeding mother  Lungs: clear to auscultation bilaterally  Heart:  regular rate and rhythm, S1, S2 normal, no murmur, click, rub or gallop  Abdomen: soft, non-tender; bowel sounds normal; no masses,  no organomegaly   Vulva:  not evaluated  Vagina: not evaluated  Cervix:  not evaluated  Corpus: not examined  Adnexa:  not evaluated  Rectal Exam: Not performed.        Assessment:     normal postpartum exam. Pap smear not done at today's visit.   Plan:    1. Contraception: Depo-Provera injections 2. Elevated BP on amlodipine 10mg . Bp a little elevated today, but patient has not taken her Bp medication today. Advised follow up with PCP, may need to restart lisinopril 3. Follow up in: 3 month or as needed.

## 2016-06-30 NOTE — Lactation Note (Signed)
This note was copied from a baby's chart. Lactation Consultation Note  Patient Name: Suzanne [name] ZOXWR'UToday's Date: 06/30/2016 Reason for consult: Follow-up assessment;NICU baby;Infant < 6lbs   Spoke with mom at infant's bedside. Mom reports she is pumping some, not regularly. She reports what little bit she is getting she is freezing and keeping at home for when infant comes home. She has no questions/concerns at this time. Follow up prn.    Maternal Data    Feeding Feeding Type: Other (comment)  LATCH Score/Interventions                      Lactation Tools Discussed/Used     Consult Status Consult Status: PRN Follow-up type: Call as needed    Ed BlalockSharon S Shanice Poznanski 06/30/2016, 11:21 AM

## 2016-06-30 NOTE — Patient Instructions (Signed)
Contraceptive Injection, Care After Refer to this sheet in the next few weeks. These instructions provide you with information on caring for yourself after your procedure. Your health care provider may also give you more specific instructions. Your treatment has been planned according to current medical practices, but problems sometimes occur. Call your health care provider if you have any problems or questions after your procedure. What can I expect after the procedure? The injection site may be a little sore for a couple of days. Do not massage the injection site. Follow these instructions at home:  Loraine LericheMark your calendar to get your next injection on time. Also, mark your calendar so you can see if your menstrual periods become irregular.  Always use a condom to protect against STDs.  Do not smoke. Contact a health care provider if:  You have nausea, vomiting, or a change in appetite.  You have abnormal vaginal discharge.  You have a rash.  You miss your menstrual period or think you might be pregnant.  You have abnormal bleeding.  You start to lose your hair.  You need treatment for mood changes or depression.  You get dizzy or lightheaded.  You have leg pain. Get help right away if:  You have chest pain.  You are coughing up blood.  You have shortness of breath.  You have an uncontrollable headache.  You have numbness or slurred speech.  You have vision problems.  You have heavy or prolonged vaginal bleeding.  You have yellowing of the skin and eyes (jaundice).  You have severe and uncontrolled depression. This information is not intended to replace advice given to you by your health care provider. Make sure you discuss any questions you have with your health care provider. Document Released: 06/25/2005 Document Revised: 12/19/2015 Document Reviewed: 01/10/2013 Elsevier Interactive Patient Education  2017 [employer]

## 2016-09-15 ENCOUNTER — Ambulatory Visit (INDEPENDENT_AMBULATORY_CARE_PROVIDER_SITE_OTHER): Payer: BLUE CROSS/BLUE SHIELD | Admitting: *Deleted

## 2016-09-15 VITALS — BP 151/93 | HR 79 | Wt 210.3 lb

## 2016-09-15 DIAGNOSIS — Z3042 Encounter for surveillance of injectable contraceptive: Secondary | ICD-10-CM

## 2016-09-15 MED ORDER — MEDROXYPROGESTERONE ACETATE 150 MG/ML IM SUSP
150.0000 mg | Freq: Once | INTRAMUSCULAR | Status: AC
  Administered 2016-09-15: 150 mg via INTRAMUSCULAR

## 2016-09-15 NOTE — Progress Notes (Signed)
Depo Provera 150 mg administered as scheduled - tolerated well.  Pt has not had follow up of hypertension since birth of her baby in October 2017. She has not taken Norvasc x2 days because she forgot. Pt advised of importance of taking the medication daily. Also she was recommended to schedule appt for follow up of hypertension management w/PCP within a month. She voiced understanding and states [facility].

## 2016-12-01 ENCOUNTER — Ambulatory Visit (INDEPENDENT_AMBULATORY_CARE_PROVIDER_SITE_OTHER): Payer: BLUE CROSS/BLUE SHIELD

## 2016-12-01 VITALS — BP 151/100 | HR 72

## 2016-12-01 DIAGNOSIS — Z3042 Encounter for surveillance of injectable contraceptive: Secondary | ICD-10-CM | POA: Diagnosis not present

## 2016-12-01 MED ORDER — MEDROXYPROGESTERONE ACETATE 150 MG/ML IM SUSP
150.0000 mg | Freq: Once | INTRAMUSCULAR | Status: AC
  Administered 2016-12-01: 150 mg via INTRAMUSCULAR

## 2016-12-01 NOTE — Progress Notes (Signed)
Patient presented to office today for her depo-provera.

## 2017-02-17 ENCOUNTER — Ambulatory Visit (INDEPENDENT_AMBULATORY_CARE_PROVIDER_SITE_OTHER): Payer: BLUE CROSS/BLUE SHIELD

## 2017-02-17 VITALS — BP 149/98 | HR 88

## 2017-02-17 DIAGNOSIS — Z3042 Encounter for surveillance of injectable contraceptive: Secondary | ICD-10-CM

## 2017-02-17 DIAGNOSIS — Z30019 Encounter for initial prescription of contraceptives, unspecified: Secondary | ICD-10-CM

## 2017-02-17 MED ORDER — MEDROXYPROGESTERONE ACETATE 150 MG/ML IM SUSP
150.0000 mg | Freq: Once | INTRAMUSCULAR | Status: AC
  Administered 2017-02-17: 150 mg via INTRAMUSCULAR

## 2017-02-17 NOTE — Progress Notes (Signed)
Depo injection given as scheduled. Patient tolerated well.Patient's BP was elevated. Patient stated that she did take her medication.

## 2017-05-05 ENCOUNTER — Ambulatory Visit (INDEPENDENT_AMBULATORY_CARE_PROVIDER_SITE_OTHER): Payer: BLUE CROSS/BLUE SHIELD | Admitting: General Practice

## 2017-05-05 VITALS — BP 153/88 | HR 84 | Ht 65.0 in | Wt 220.0 lb

## 2017-05-05 DIAGNOSIS — Z3042 Encounter for surveillance of injectable contraceptive: Secondary | ICD-10-CM | POA: Diagnosis not present

## 2017-05-05 MED ORDER — MEDROXYPROGESTERONE ACETATE 150 MG/ML IM SUSP
150.0000 mg | Freq: Once | INTRAMUSCULAR | Status: AC
  Administered 2017-05-05: 150 mg via INTRAMUSCULAR

## 2017-08-04 ENCOUNTER — Ambulatory Visit: Payer: Self-pay | Admitting: Nurse Practitioner

## 2017-08-18 ENCOUNTER — Encounter: Payer: Self-pay | Admitting: Medical

## 2017-08-18 ENCOUNTER — Ambulatory Visit (INDEPENDENT_AMBULATORY_CARE_PROVIDER_SITE_OTHER): Payer: BLUE CROSS/BLUE SHIELD | Admitting: Medical

## 2017-08-18 VITALS — BP 174/100 | HR 85 | Ht 65.0 in | Wt 223.7 lb

## 2017-08-18 DIAGNOSIS — Z01419 Encounter for gynecological examination (general) (routine) without abnormal findings: Secondary | ICD-10-CM | POA: Diagnosis not present

## 2017-08-18 DIAGNOSIS — I10 Essential (primary) hypertension: Secondary | ICD-10-CM

## 2017-08-18 DIAGNOSIS — Z3042 Encounter for surveillance of injectable contraceptive: Secondary | ICD-10-CM | POA: Diagnosis not present

## 2017-08-18 LAB — POCT PREGNANCY, URINE: PREG TEST UR: NEGATIVE

## 2017-08-18 MED ORDER — MEDROXYPROGESTERONE ACETATE 150 MG/ML IM SUSP
150.0000 mg | Freq: Once | INTRAMUSCULAR | Status: AC
  Administered 2017-08-18: 150 mg via INTRAMUSCULAR

## 2017-08-18 MED ORDER — AMLODIPINE BESYLATE 10 MG PO TABS: 10.0000 mg | ORAL_TABLET | Freq: Every day | ORAL | 0 refills | Status: AC

## 2017-08-18 NOTE — Progress Notes (Signed)
Subjective:    Suzanne StableJamila [name] is a 42 y.o. female who presents for an annual exam. The patient has no complaints today. The patient is not currently sexually active. GYN screening history: last pap: approximate date 2017 and was normal. The patient wears seatbelts: yes. The patient participates in regular exercise: no. Has the patient ever been transfused or tattooed?: no. The patient reports that there is not domestic violence in her life.   Patient recently ran out of BP medications. Denies concerning S/S today for CVA or MI. [facility] to [facility] due to balance to be paid.   Menstrual History: OB History    Gravida Para Term Preterm AB Living   2 1   1 1 1    SAB TAB Ectopic Multiple Live Births   1     0 1      Menarche age: 42 years old No LMP recorded (lmp unknown).    The following portions of the patient's history were reviewed and updated as appropriate: allergies, current medications, past family history, past medical history, past social history, past surgical history and problem list.  Review of Systems Pertinent items are noted in HPI.    Objective:   Physical Exam  Nursing note and vitals reviewed. Constitutional: She is oriented to person, place, and time. She appears well-developed and well-nourished. No distress.  HENT:  Head: Normocephalic and atraumatic.  Cardiovascular: Normal rate, regular rhythm and normal heart sounds.  Respiratory: Effort normal and breath sounds normal. No respiratory distress. She has no wheezes.  GI: Soft. Bowel sounds are normal. She exhibits no distension and no mass. There is no tenderness. There is no rebound and no guarding.  Genitourinary: Uterus is not enlarged and not tender. Cervix exhibits no motion tenderness, no discharge and no friability. Right adnexum displays no mass and no tenderness. Left adnexum displays no mass and no tenderness. There is bleeding (scant) in the vagina. No vaginal discharge found.   Musculoskeletal: She exhibits no edema.  Neurological: She is alert and oriented to person, place, and time.  Skin: Skin is warm and dry. No erythema.  Psychiatric: She has a normal mood and affect.   Results for orders placed or performed in visit on 08/18/17 (from the past 24 hour(s))  Pregnancy, urine POC     Status: None   Collection Time: 08/18/17  4:28 PM  Result Value Ref Range   Preg Test, Ur NEGATIVE NEGATIVE     Assessment:    Healthy female exam.  Negative pregnancy test Birth control management - Depo Provera Chronic HTN, benign  Plan:     Pregnancy test, result: negative.   Depo provera given today  Rx for Norvasc given for 1 month to restart medications ASAP and [facility] Warning signs for CVA and MI discussed. Patient [facility] if symptoms arise Initial screening mammogram scheduled Next pap smear due 2020 Patient may return to [facility] in 1 year for annual exam and pap smear or sooner PRN  Marny LowensteinWenzel, Tanyia Grabbe N, PA-C 08/18/2017 4:54 PM

## 2017-08-18 NOTE — Patient Instructions (Signed)

## 2017-08-18 NOTE — Progress Notes (Signed)
Pap 12/2015 negative with negative hr hpv . Noted elevated bp because ran out of blood pressure and states can't see pcp because she owes them money. Offered to schedule mamogram- declines. Referral to [facility] for PCP sent

## 2017-08-28 DIAGNOSIS — R110 Nausea: Secondary | ICD-10-CM | POA: Diagnosis not present

## 2017-08-28 DIAGNOSIS — I10 Essential (primary) hypertension: Secondary | ICD-10-CM | POA: Insufficient documentation

## 2017-08-28 DIAGNOSIS — R51 Headache: Secondary | ICD-10-CM | POA: Diagnosis not present

## 2017-08-28 DIAGNOSIS — Z7982 Long term (current) use of aspirin: Secondary | ICD-10-CM | POA: Insufficient documentation

## 2017-08-28 DIAGNOSIS — R079 Chest pain, unspecified: Secondary | ICD-10-CM | POA: Diagnosis present

## 2017-08-28 DIAGNOSIS — F17210 Nicotine dependence, cigarettes, uncomplicated: Secondary | ICD-10-CM | POA: Insufficient documentation

## 2017-08-28 DIAGNOSIS — K219 Gastro-esophageal reflux disease without esophagitis: Secondary | ICD-10-CM | POA: Diagnosis not present

## 2017-08-28 DIAGNOSIS — Z79899 Other long term (current) drug therapy: Secondary | ICD-10-CM | POA: Diagnosis not present

## 2017-08-28 DIAGNOSIS — R0789 Other chest pain: Secondary | ICD-10-CM | POA: Diagnosis not present

## 2017-08-29 ENCOUNTER — Emergency Department (HOSPITAL_COMMUNITY): Payer: BLUE CROSS/BLUE SHIELD

## 2017-08-29 ENCOUNTER — Other Ambulatory Visit: Payer: Self-pay

## 2017-08-29 ENCOUNTER — Emergency Department (HOSPITAL_COMMUNITY)
Admission: EM | Admit: 2017-08-29 | Discharge: 2017-08-29 | Disposition: A | Discharge status: Home / Self Care | Payer: BLUE CROSS/BLUE SHIELD | Attending: Emergency Medicine | Admitting: Emergency Medicine

## 2017-08-29 ENCOUNTER — Encounter (HOSPITAL_COMMUNITY): Payer: Self-pay | Admitting: Emergency Medicine

## 2017-08-29 DIAGNOSIS — R519 Headache, unspecified: Secondary | ICD-10-CM

## 2017-08-29 DIAGNOSIS — R0789 Other chest pain: Secondary | ICD-10-CM

## 2017-08-29 DIAGNOSIS — R079 Chest pain, unspecified: Secondary | ICD-10-CM

## 2017-08-29 DIAGNOSIS — R51 Headache: Secondary | ICD-10-CM

## 2017-08-29 LAB — CBC WITH DIFFERENTIAL/PLATELET
BASOS PCT: 0 %
Basophils Absolute: 0 10*3/uL (ref 0.0–0.1)
EOS ABS: 0.1 10*3/uL (ref 0.0–0.7)
EOS PCT: 1 %
HCT: 38.6 % (ref 36.0–46.0)
Hemoglobin: 12.7 g/dL (ref 12.0–15.0)
Lymphocytes Relative: 34 %
Lymphs Abs: 3 10*3/uL (ref 0.7–4.0)
MCH: 32 pg (ref 26.0–34.0)
MCHC: 32.9 g/dL (ref 30.0–36.0)
MCV: 97.2 fL (ref 78.0–100.0)
MONOS PCT: 6 %
Monocytes Absolute: 0.5 10*3/uL (ref 0.1–1.0)
NEUTROS PCT: 59 %
Neutro Abs: 5.3 10*3/uL (ref 1.7–7.7)
PLATELETS: 357 10*3/uL (ref 150–400)
RBC: 3.97 MIL/uL (ref 3.87–5.11)
RDW: 12.6 % (ref 11.5–15.5)
WBC: 9 10*3/uL (ref 4.0–10.5)

## 2017-08-29 LAB — I-STAT TROPONIN, ED
TROPONIN I, POC: 0 ng/mL (ref 0.00–0.08)
Troponin i, poc: 0 ng/mL (ref 0.00–0.08)

## 2017-08-29 LAB — COMPREHENSIVE METABOLIC PANEL
ALBUMIN: 3.8 g/dL (ref 3.5–5.0)
ALK PHOS: 115 U/L (ref 38–126)
ALT: 17 U/L (ref 14–54)
ANION GAP: 13 (ref 5–15)
AST: 15 U/L (ref 15–41)
BUN: 12 mg/dL (ref 6–20)
CHLORIDE: 105 mmol/L (ref 101–111)
CO2: 19 mmol/L — AB (ref 22–32)
Calcium: 9.3 mg/dL (ref 8.9–10.3)
Creatinine, Ser: 0.98 mg/dL (ref 0.44–1.00)
GFR calc Af Amer: >60 mL/min (ref >60)
GFR calc non Af Amer: >60 mL/min (ref >60)
GLUCOSE: 104 mg/dL — AB (ref 65–99)
POTASSIUM: 3.2 mmol/L — AB (ref 3.5–5.1)
SODIUM: 137 mmol/L (ref 135–145)
Total Bilirubin: 0.7 mg/dL (ref 0.3–1.2)
Total Protein: 7.8 g/dL (ref 6.5–8.1)

## 2017-08-29 LAB — I-STAT BETA HCG BLOOD, ED (MC, WL, AP ONLY): I-stat hCG, quantitative: <5 m[IU]/mL (ref <5)

## 2017-08-29 LAB — D-DIMER, QUANTITATIVE (NOT AT ARMC): D DIMER QUANT: 0.31 ug{FEU}/mL (ref 0.00–0.50)

## 2017-08-29 MED ORDER — SODIUM CHLORIDE 0.9 % IV BOLUS (SEPSIS): 1000.0000 mL | Freq: Once | INTRAVENOUS | Status: DC

## 2017-08-29 MED ORDER — BUTALBITAL-APAP-CAFFEINE 50-325-40 MG PO TABS
2.0000 | ORAL_TABLET | Freq: Once | ORAL | Status: AC
  Administered 2017-08-29: 2 via ORAL
  Filled 2017-08-29: qty 2

## 2017-08-29 MED ORDER — DIPHENHYDRAMINE HCL 50 MG/ML IJ SOLN
12.5000 mg | Freq: Once | INTRAMUSCULAR | Status: DC
  Filled 2017-08-29: qty 1

## 2017-08-29 MED ORDER — SODIUM CHLORIDE 0.9 % IV SOLN: INTRAVENOUS | Status: DC

## 2017-08-29 MED ORDER — ASPIRIN 81 MG PO CHEW
324.0000 mg | CHEWABLE_TABLET | Freq: Once | ORAL | Status: AC
  Administered 2017-08-29: 324 mg via ORAL
  Filled 2017-08-29: qty 4

## 2017-08-29 MED ORDER — PROCHLORPERAZINE EDISYLATE 5 MG/ML IJ SOLN
10.0000 mg | Freq: Once | INTRAMUSCULAR | Status: DC
  Filled 2017-08-29: qty 2

## 2017-08-29 NOTE — ED Triage Notes (Signed)
Patient with chest pain that started this morning.  She also having headache for the last two days, has a history of migraines but this pain feels different.  No sensitivity to light or noise, she did recently start a new BP medications.

## 2017-08-29 NOTE — ED Notes (Signed)
Unable to return prochlorperazine and benadryl. Placed in pharmacy bin.

## 2017-08-29 NOTE — ED Provider Notes (Signed)
Patient seen by Muthersbaugh, PA-C and signed out to me.  Patient here for CP and headache.  Pain has resolved.  Patient is requesting to be discharged.  Delta trop negative.  HEART score is 2.  D dimer negative, doubt PE.   Roxy HorsemanBrowning, Bennette Hasty, PA-C 08/29/17 [phone]

## 2017-08-29 NOTE — ED Provider Notes (Signed)
[facility] EMERGENCY DEPARTMENT Provider Note   CSN: [id number] Arrival date & time: 08/28/17  2346     History   Chief Complaint Chief Complaint  Patient presents with  . Chest Pain    HPI Suzanne [name] is a 42 y.o. female with a hx of GERD, HTN, sciatica presents to the Emergency Department complaining of gradual, persistent, progressively worsening frontal and posterior headache onset yesterday.  Pt reports the pain radiates into the left jaw and neck. Pt reports a hx of migraines, but states this is different from her normal migraines.  Pt reports "this just feels different."  Associated symptoms include nausea without vomiting.  No photo or phonophobia.  Pt also reports central chest tightness and belching onset this morning.  Pt reports the pain radiates into the right and left chest under her breast.  Pt reports she has been eating less in the last few days, but normally only eats 1x per day.  She reports no specific decrease in appetite.  Belching makes it better and nothing makes it worse.  Pt denies fever, chills, neck stiffness, abd pain, vomiting, diarrhea, weakness, dizziness, syncope.  Pt reports increased stress in her life.       The history is provided by the patient and medical records. No language interpreter was used.    Past Medical History:  Diagnosis Date  . GERD (gastroesophageal reflux disease)   . Hypertension   . Sciatica     Patient Active Problem List   Diagnosis Date Noted  . GERD (gastroesophageal reflux disease) 01/09/2016  . Hypertension, benign 01/09/2016    Past Surgical History:  Procedure Laterality Date  . CERVICAL BIOPSY    . CESAREAN SECTION N/A 05/15/2016   Procedure: CESAREAN SECTION;  Surgeon: Tilda Burrow, MD;  Location: [facility] BIRTHING SUITES;  Service: Obstetrics;  Laterality: N/A;  . COLPOSCOPY      OB History    Gravida Para Term Preterm AB Living   2 1   1 1 1    SAB TAB Ectopic Multiple Live Births   1     0 1       Home Medications    Prior to Admission medications   Medication Sig Start Date End Date Taking? Authorizing Provider  amLODipine (NORVASC) 10 MG tablet Take 1 tablet (10 mg total) by mouth daily. 08/18/17   Marny Lowenstein, PA-C  aspirin 81 MG chewable tablet Chew by mouth.    [provider]  omeprazole (PRILOSEC) 20 MG capsule Take 20 mg by mouth daily.    [provider]  potassium chloride (K-DUR) 10 MEQ tablet Take by mouth.    [provider]  Prenat w/o A Vit-FeFum-FePo-FA (CONCEPT OB) 130-92.4-1 MG CAPS Take 1 capsule by mouth daily. 01/09/16   Reva Bores, MD    Family History Family History  Problem Relation Age of Onset  . Diabetes Mother   . Hypertension Mother   . GER disease Mother     Social History Social History   Tobacco Use  . Smoking status: Current Some Day Smoker    Packs/day: 0.30    Years: 15.00    Pack years: 4.50    Types: Cigarettes    Last attempt to quit: 01/02/2016    Years since quitting: 1.6  . Smokeless tobacco: Never Used  Substance Use Topics  . Alcohol use: No    Comment: rare  . Drug use: Yes    Frequency: 1.0 times per  week    Types: Marijuana     Allergies   Coconut flavor; Latex; Pineapple; and Tomato   Review of Systems Review of Systems  Constitutional: Negative for appetite change, diaphoresis, fatigue, fever and unexpected weight change.  HENT: Negative for mouth sores.   Eyes: Negative for visual disturbance.  Respiratory: Positive for chest tightness. Negative for cough, shortness of breath and wheezing.   Cardiovascular: Positive for chest pain.  Gastrointestinal: Positive for nausea. Negative for abdominal pain, constipation, diarrhea and vomiting.  Endocrine: Negative for polydipsia, polyphagia and polyuria.  Genitourinary: Negative for dysuria, frequency, hematuria and urgency.  Musculoskeletal: Negative for back pain and neck stiffness.  Skin: Negative for rash.    Allergic/Immunologic: Negative for immunocompromised state.  Neurological: Positive for headaches. Negative for syncope and light-headedness.  Hematological: Does not bruise/bleed easily.  Psychiatric/Behavioral: Negative for sleep disturbance. The patient is not nervous/anxious.      Physical Exam Updated Vital Signs BP (!) 169/94 (BP Location: Left Arm)   Pulse (!) 111   Temp 98.7 F (37.1 C) (Oral)   Resp 16   Ht 5\' 5"  (1.651 m)   Wt 101.2 kg (223 lb)   LMP  (LMP Unknown) Comment: spots sometimes, but no period since 05/2017  SpO2 100%   BMI 37.11 kg/m   Physical Exam  Constitutional: She is oriented to person, place, and time. She appears well-developed and well-nourished. No distress.  Awake, alert, nontoxic appearance  HENT:  Head: Normocephalic and atraumatic.  Mouth/Throat: Oropharynx is clear and moist. No oropharyngeal exudate.  Eyes: Conjunctivae and EOM are normal. Pupils are equal, round, and reactive to light. No scleral icterus.  No horizontal, vertical or rotational nystagmus  Neck: Normal range of motion. Neck supple.  Full active and passive ROM without pain No midline or paraspinal tenderness No nuchal rigidity or meningeal signs  Cardiovascular: Regular rhythm and intact distal pulses. Tachycardia present.  Pulmonary/Chest: Effort normal and breath sounds normal. No respiratory distress. She has no wheezes. She has no rales.  Equal chest expansion  Abdominal: Soft. Bowel sounds are normal. She exhibits no mass. There is no tenderness. There is no rebound and no guarding.  Musculoskeletal: Normal range of motion. She exhibits no edema.  No calf tenderness No peripheral edema  Lymphadenopathy:    She has no cervical adenopathy.  Neurological: She is alert and oriented to person, place, and time. No cranial nerve deficit. She exhibits normal muscle tone. Coordination normal.  Mental Status:  Alert, oriented, thought content appropriate. Speech fluent  without evidence of aphasia. Able to follow 2 step commands without difficulty.  Cranial Nerves:  II:  Peripheral visual fields grossly normal, pupils equal, round, reactive to light III,IV, VI: ptosis not present, extra-ocular motions intact bilaterally  V,VII: smile symmetric, facial light touch sensation equal VIII: hearing grossly normal bilaterally  IX,X: midline uvula rise  XI: bilateral shoulder shrug equal and strong XII: midline tongue extension  Motor:  5/5 in upper and lower extremities bilaterally including strong and equal grip strength and dorsiflexion/plantar flexion Sensory: Pinprick and light touch normal in all extremities.  Cerebellar: normal finger-to-nose with bilateral upper extremities Gait: normal gait and balance CV: distal pulses palpable throughout   Skin: Skin is warm and dry. No rash noted. She is not diaphoretic.  Psychiatric: She has a normal mood and affect. Her behavior is normal. Judgment and thought content normal.  Nursing note and vitals reviewed.    ED Treatments / Results  Labs (all labs  ordered are listed, but only abnormal results are displayed) Labs Reviewed  COMPREHENSIVE METABOLIC PANEL - Abnormal; Notable for the following components:      Result Value   Potassium 3.2 (*)    CO2 19 (*)    Glucose, Bld 104 (*)    All other components within normal limits  CBC WITH DIFFERENTIAL/PLATELET  D-DIMER, QUANTITATIVE (NOT AT [facility])  URINALYSIS, ROUTINE W REFLEX MICROSCOPIC  I-STAT BETA HCG BLOOD, ED ([facility], [facility], [facility] ONLY)  I-STAT TROPONIN, ED    EKG  EKG Interpretation  Date/Time:  Saturday August 28 2017 23:50:38 EST Ventricular Rate:  111 PR Interval:  130 QRS Duration: 70 QT Interval:  382 QTC Calculation: 519 R Axis:   29 Text Interpretation:  Sinus tachycardia Cannot rule out Anterior infarct , age undetermined Abnormal ECG When compared with ECG of 09/27/2014, HEART RATE has increased Confirmed by Dione Booze ([id number]) on 08/29/2017  1:35:55 AM       Radiology Dg Chest 2 View  Result Date: 08/29/2017 CLINICAL DATA:  Chest pain starting this morning. Headaches for 2 days. Left-sided face, neck, and arm pain. EXAM: CHEST  2 VIEW COMPARISON:  09/27/2014 FINDINGS: The heart size and mediastinal contours are within normal limits. Both lungs are clear. The visualized skeletal structures are unremarkable. IMPRESSION: No active cardiopulmonary disease. Electronically Signed   By: Burman Nieves M.D.   On: 08/29/2017 01:05    Procedures Procedures (including critical care time)  Medications Ordered in ED Medications  aspirin chewable tablet 324 mg (not administered)  sodium chloride 0.9 % bolus 1,000 mL (not administered)  prochlorperazine (COMPAZINE) injection 10 mg (not administered)  diphenhydrAMINE (BENADRYL) injection 12.5 mg (not administered)     Initial Impression / Assessment and Plan / ED Course  I have reviewed the triage vital signs and the nursing notes.  Pertinent labs & imaging results that were available during my care of the patient were reviewed by me and considered in my medical decision making (see chart for details).     Patient with headache and chest pain.  Patient is a history of migraines but reports this headache is different.  She is unable to qualify what is different.  Discussed with patient my concern for change in headache.  I have recommended CT scan however patient reports she has Bobby pins in her hair and she is unwilling to take them out.  Will give migraine cocktail and reevaluate.  She has a normal neurologic exam.  Patient also with chest pain onset this morning.  Patient has no cardiac history, however she does have tachycardia upon arrival here in the emergency department.  Will evaluate for ACS.  1:57 AM Care transferred to Ivar Drape, PA-C who will follow pending studies, re-evaulate and determine disposition.    Final Clinical Impressions(s) / ED Diagnoses   Final  diagnoses:  Chest pressure  Bad headache    ED Discharge Orders    None       Mardene Sayer Boyd Kerbs 08/29/17 0158    Derwood Kaplan, MD 08/30/17 [phone]

## 2017-09-07 ENCOUNTER — Ambulatory Visit
Admission: RE | Admit: 2017-09-07 | Discharge: 2017-09-07 | Disposition: A | Discharge status: Home / Self Care | Payer: BLUE CROSS/BLUE SHIELD | Source: Ambulatory Visit | Attending: Medical | Admitting: Medical

## 2017-09-07 DIAGNOSIS — Z01419 Encounter for gynecological examination (general) (routine) without abnormal findings: Secondary | ICD-10-CM

## 2017-11-03 ENCOUNTER — Ambulatory Visit (INDEPENDENT_AMBULATORY_CARE_PROVIDER_SITE_OTHER): Payer: BLUE CROSS/BLUE SHIELD | Admitting: General Practice

## 2017-11-03 VITALS — BP 136/91 | HR 86 | Ht 66.0 in | Wt 228.0 lb

## 2017-11-03 DIAGNOSIS — Z3042 Encounter for surveillance of injectable contraceptive: Secondary | ICD-10-CM

## 2017-11-03 MED ORDER — MEDROXYPROGESTERONE ACETATE 150 MG/ML IM SUSP
150.0000 mg | Freq: Once | INTRAMUSCULAR | Status: AC
  Administered 2017-11-03: 150 mg via INTRAMUSCULAR

## 2017-11-03 NOTE — Progress Notes (Signed)
I reviewed the note and agree with the nursing assessment and plan.   Gaylan Fauver, CNM 10/22/2017 10:32 AM   

## 2017-11-03 NOTE — Progress Notes (Signed)
Suzanne [name] StableJamila Venhuizen here for Depo-Provera  Injection.  Injection administered without complication. Patient will return in 3 months for next injection.  Marylynn PearsonCarrie Hillman, RN 11/03/2017  10:43 AM

## 2018-01-19 ENCOUNTER — Ambulatory Visit (INDEPENDENT_AMBULATORY_CARE_PROVIDER_SITE_OTHER): Payer: BLUE CROSS/BLUE SHIELD | Admitting: *Deleted

## 2018-01-19 VITALS — BP 131/95

## 2018-01-19 DIAGNOSIS — Z3042 Encounter for surveillance of injectable contraceptive: Secondary | ICD-10-CM

## 2018-01-19 MED ORDER — MEDROXYPROGESTERONE ACETATE 150 MG/ML IM SUSP
150.0000 mg | INTRAMUSCULAR | Status: AC
  Administered 2018-01-19 – 2020-07-30 (×7): 150 mg via INTRAMUSCULAR

## 2018-01-19 NOTE — Progress Notes (Signed)
I have reviewed the chart and agree with nursing staff's management of this patient's encounter.  Vonzella NippleJulie Shavonna Corella, PA-C 01/19/2018 11:36 AM

## 2018-03-16 IMAGING — US US OB TRANSVAGINAL
1 series · 15 of 28 positions shown · non-contrast
Comparison: Pelvic ultrasound 09/09/2015

CLINICAL DATA: Pregnant patient with abdominal cramping.

EXAM:
OBSTETRIC <14 WK US AND TRANSVAGINAL OB US
TECHNIQUE: Both transabdominal and transvaginal ultrasound examinations were
performed for complete evaluation of the gestation as well as the
maternal uterus, adnexal regions, and pelvic cul-de-sac.
Transvaginal technique was performed to assess early pregnancy.

[Series 1: us ob transvaginal · 122 acquisitions, 15 frames shown]
[im 1/122]
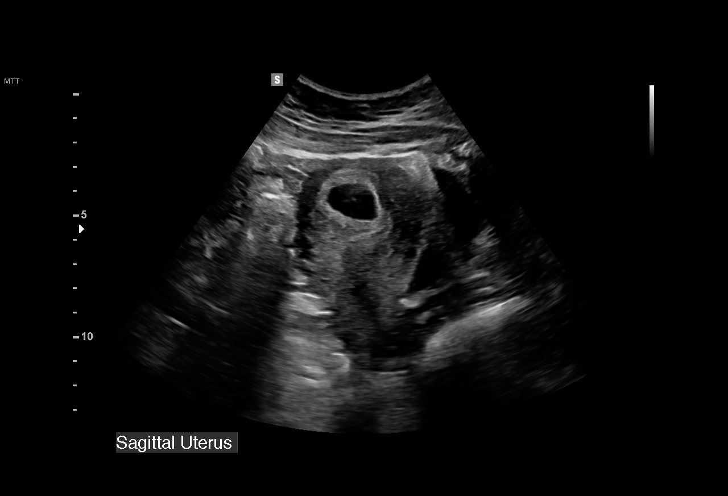
[im 9/122]
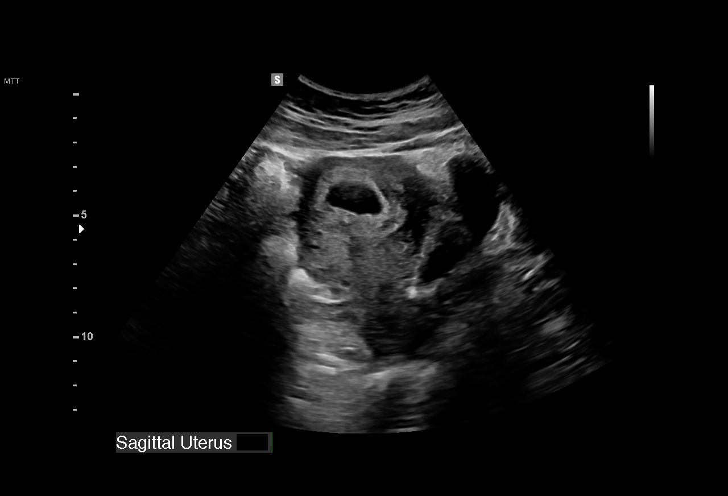
[im 18/122]
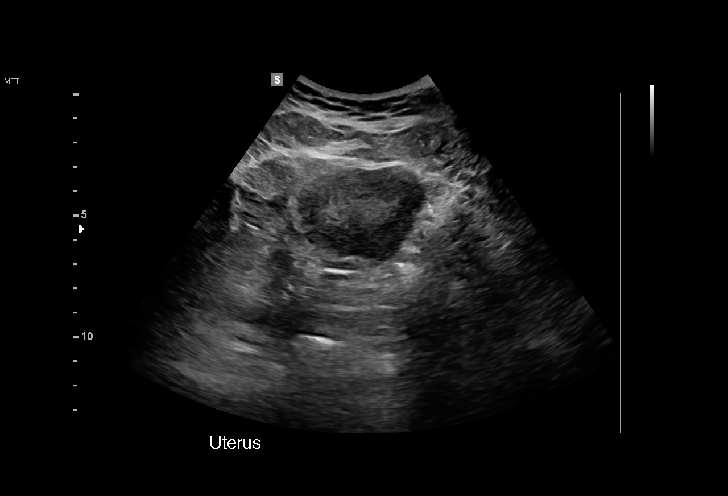
[im 27/122]
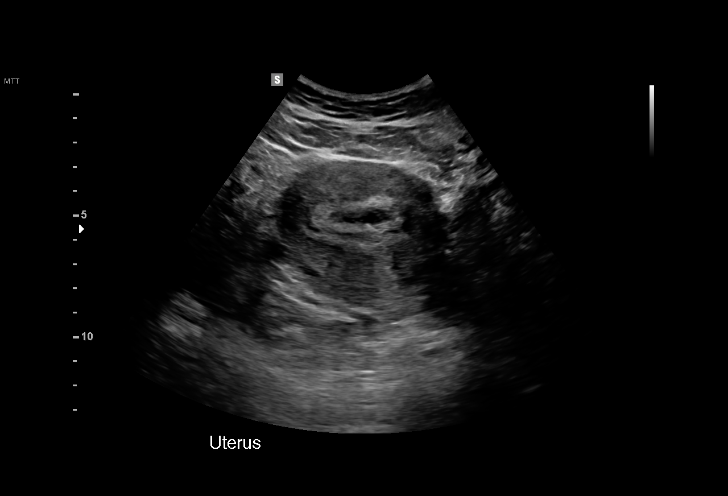
[im 36/122]
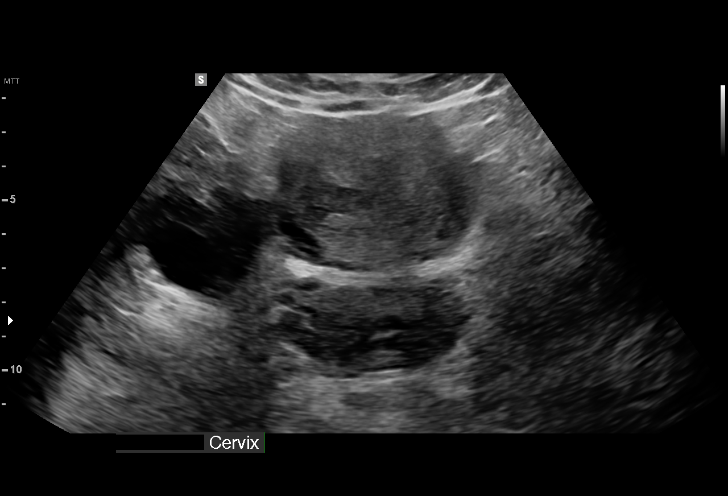
[im 45/122]
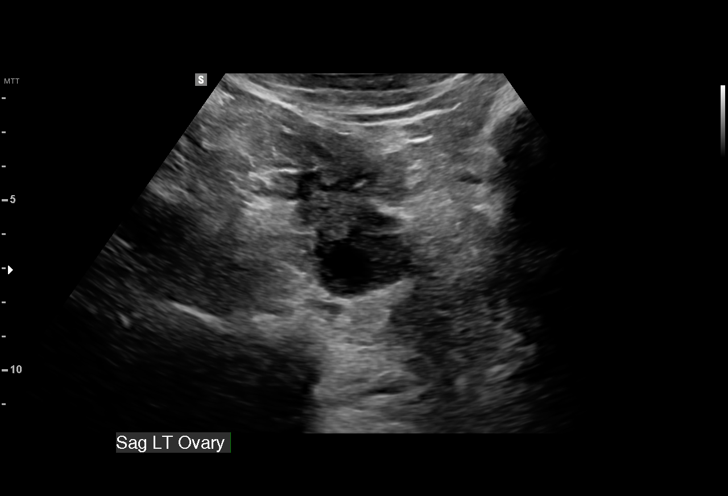
[im 54/122]
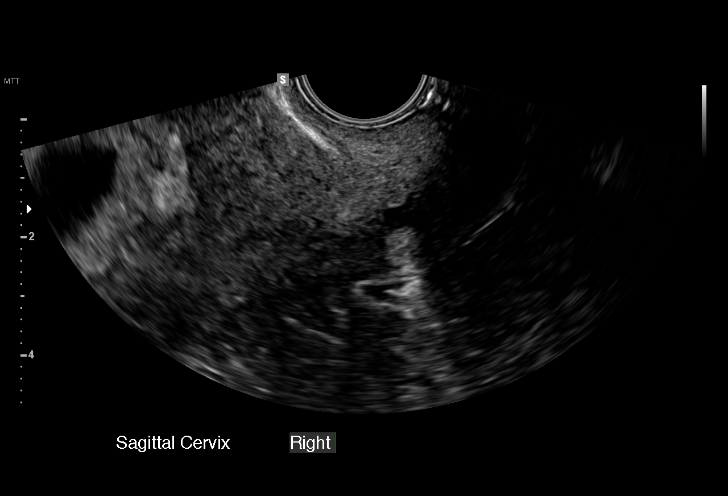
[im 63/122]
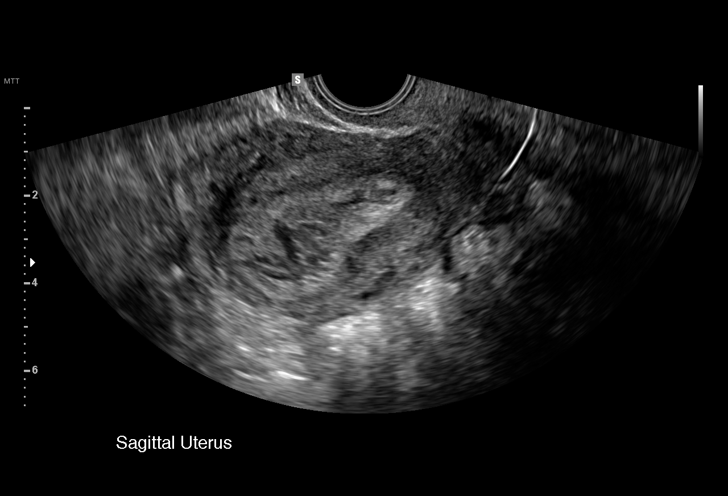
[im 68/122]
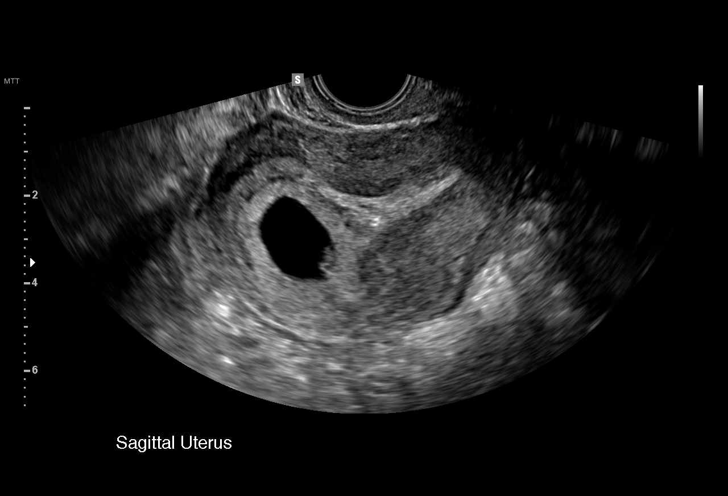
[im 77/122]
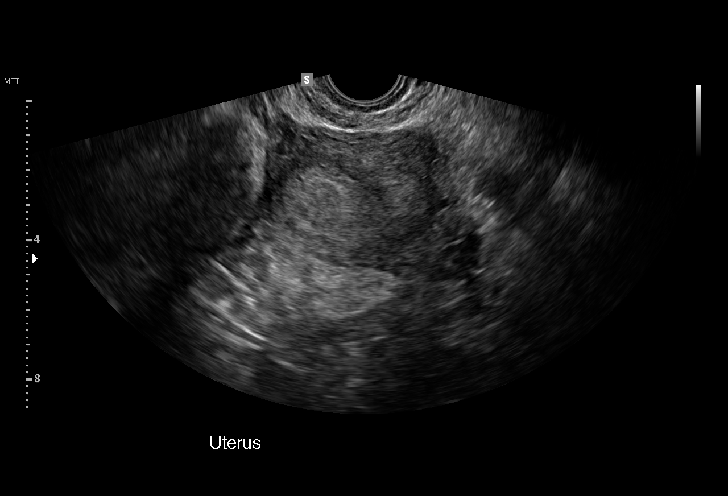
[im 86/122]
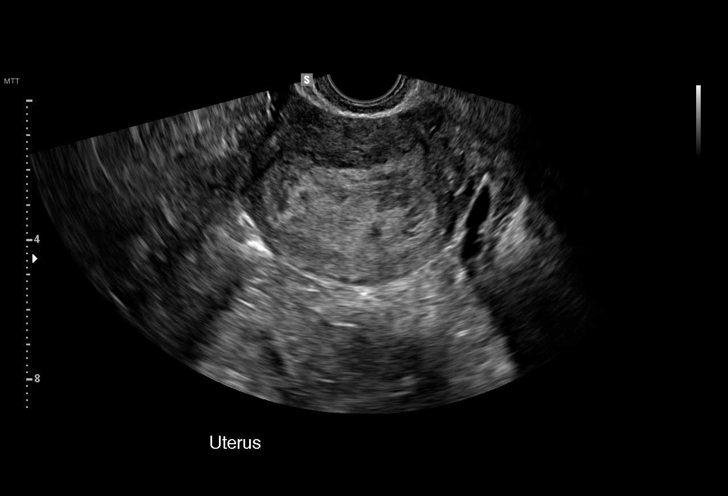
[im 95/122]
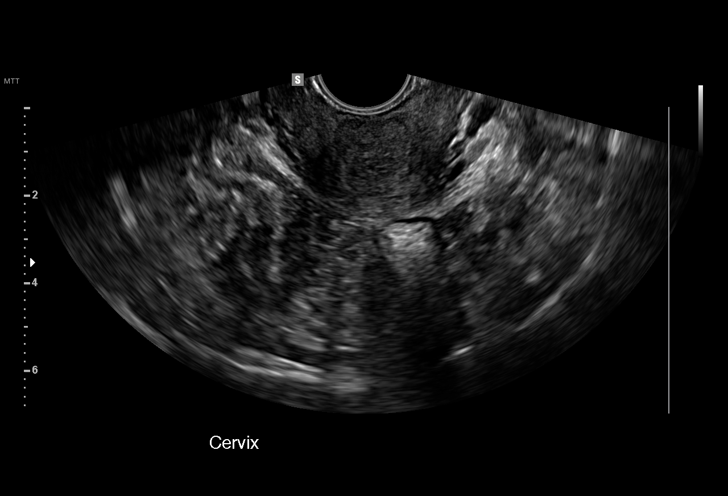
[im 104/122]
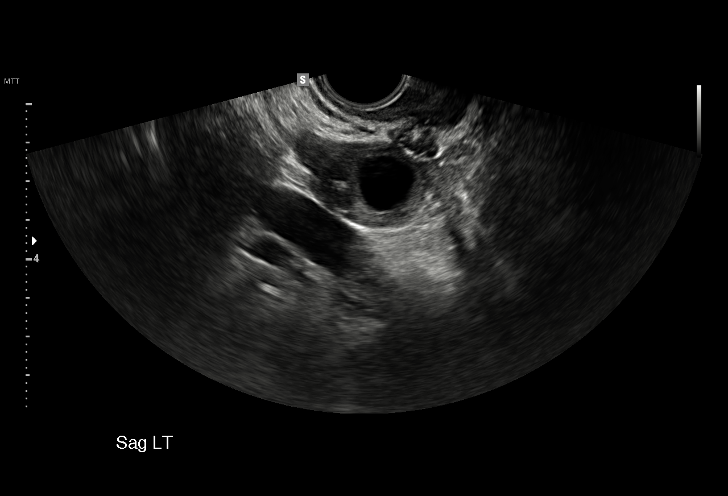
[im 113/122]
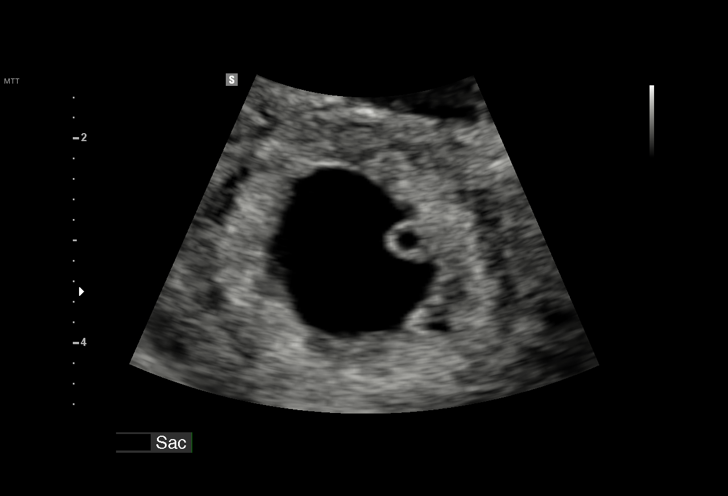
[im 122/122]
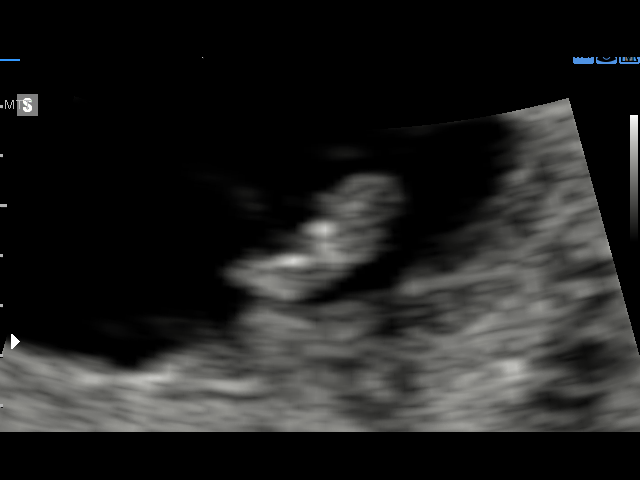

[15 of 28 positions shown; findings below may reference images not displayed]

FINDINGS: Intrauterine gestational sac: Single

Yolk sac:  Present

Embryo:  Present

Cardiac Activity: Present

Heart Rate: 132  bpm

CRL:  7.9  mm   6 w   5 d                  US EDC: 07/19/2016

Subchorionic hemorrhage:  Small

Maternal uterus/adnexae: Small hemorrhagic cyst within the right
ovary. Small simple cysts within the left ovary. Trace free fluid in
the pelvis.
IMPRESSION: Single live intrauterine gestation.  Small subchorionic hemorrhage.

## 2018-04-07 ENCOUNTER — Ambulatory Visit (INDEPENDENT_AMBULATORY_CARE_PROVIDER_SITE_OTHER): Payer: BLUE CROSS/BLUE SHIELD | Admitting: General Practice

## 2018-04-07 VITALS — BP 122/85 | HR 130 | Ht 66.0 in | Wt 225.0 lb

## 2018-04-07 DIAGNOSIS — Z3042 Encounter for surveillance of injectable contraceptive: Secondary | ICD-10-CM | POA: Diagnosis not present

## 2018-04-07 NOTE — Progress Notes (Signed)
Suzanne StableJamila [name] here for Depo-Provera  Injection.  Injection administered without complication. Patient will return in 3 months for next injection.  Suzanne PearsonCarrie Braelyn Bordonaro, RN 04/07/2018  10:33 AM

## 2018-06-27 ENCOUNTER — Ambulatory Visit: Payer: BLUE CROSS/BLUE SHIELD

## 2018-06-27 ENCOUNTER — Ambulatory Visit (INDEPENDENT_AMBULATORY_CARE_PROVIDER_SITE_OTHER): Payer: BLUE CROSS/BLUE SHIELD | Admitting: *Deleted

## 2018-06-27 VITALS — BP 130/93 | HR 104

## 2018-06-27 DIAGNOSIS — Z3042 Encounter for surveillance of injectable contraceptive: Secondary | ICD-10-CM | POA: Diagnosis not present

## 2018-06-27 NOTE — Progress Notes (Addendum)
Suzanne [name] here for Depo-Provera  Injection.  Injection administered without complication. Patient will return in 3 months for next injection. Informed patient needs annual exam before next injection- to schedule today at checkout.   Bonita QuinLinda, RN 06/27/2018  4:13 PM   Attestation of Attending Supervision of RN: Evaluation and management procedures were performed by the nurse under my supervision and collaboration.  I have reviewed the nursing note and chart, and I agree with the management and plan.  Carolyn L. Harraway-Smith, M.D., Evern CoreFACOG

## 2018-08-01 ENCOUNTER — Telehealth: Payer: Self-pay | Admitting: Family Medicine

## 2018-08-01 ENCOUNTER — Ambulatory Visit: Payer: BLUE CROSS/BLUE SHIELD | Admitting: Advanced Practice Midwife

## 2018-08-01 NOTE — Telephone Encounter (Signed)
Called to change her appointment, stated she is not feeling well. Rescheduled for a later date and time.

## 2018-08-17 ENCOUNTER — Ambulatory Visit: Payer: BLUE CROSS/BLUE SHIELD | Admitting: Nurse Practitioner

## 2018-08-20 IMAGING — US US MFM FETAL BPP W/O NON-STRESS
1 series · 15 of 28 positions shown · non-contrast
Comparison: none

[Series 1: us mfm fetal bpp w/o non-stress · 34 acquisitions, 15 frames shown]
[im 1/34]
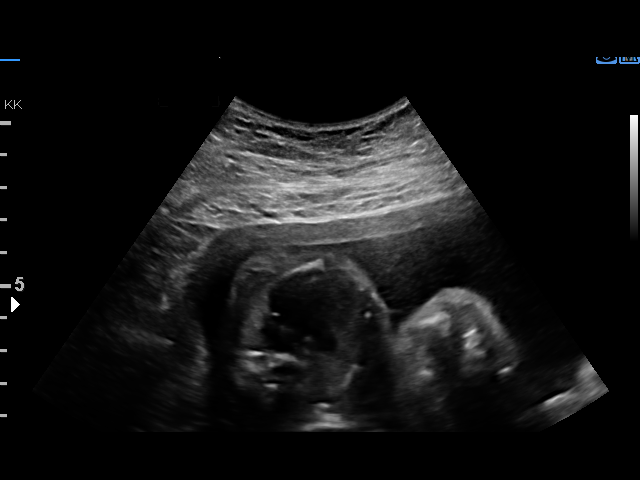
[im 3/34]
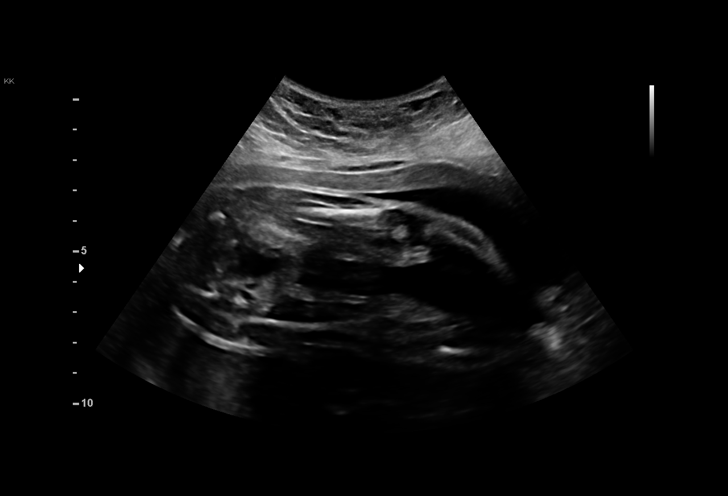
[im 5/34]
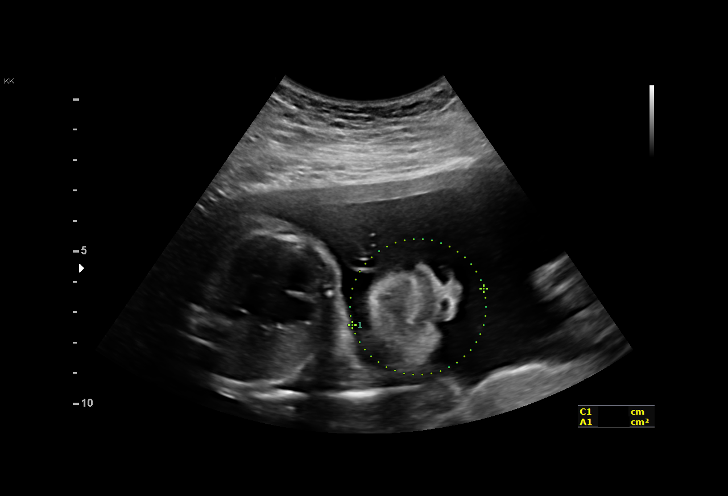
[im 8/34]
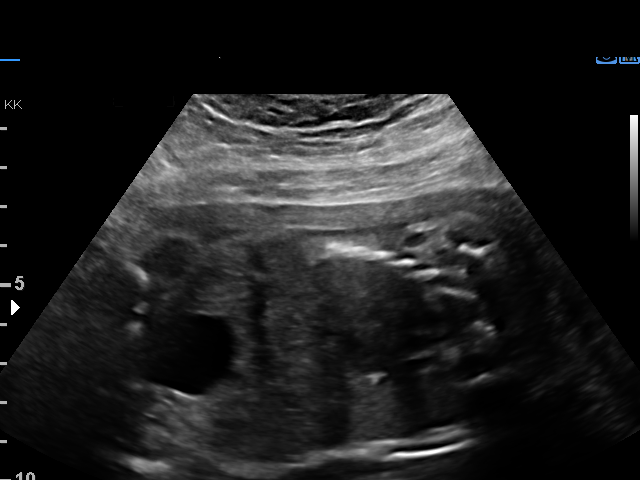
[im 10/34]
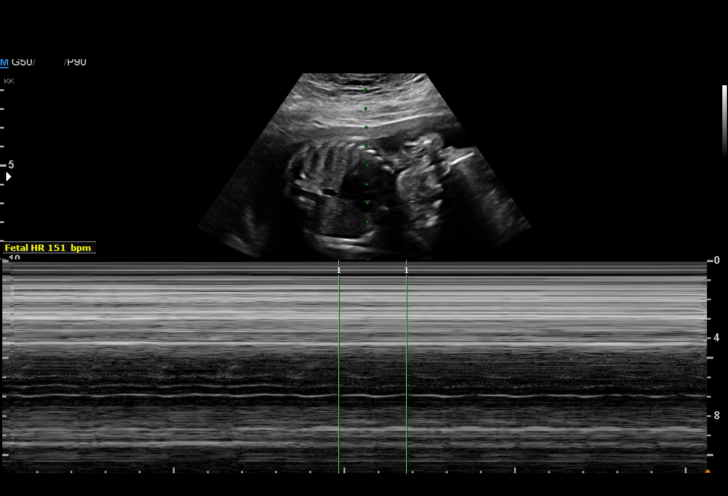
[im 13/34]
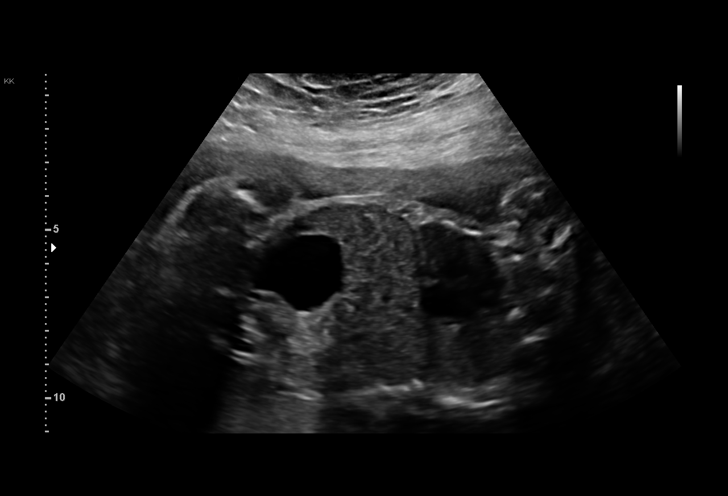
[im 15/34]
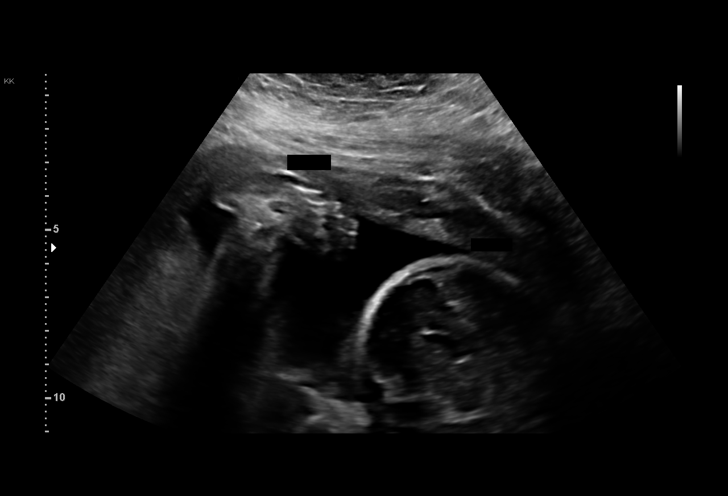
[im 18/34]
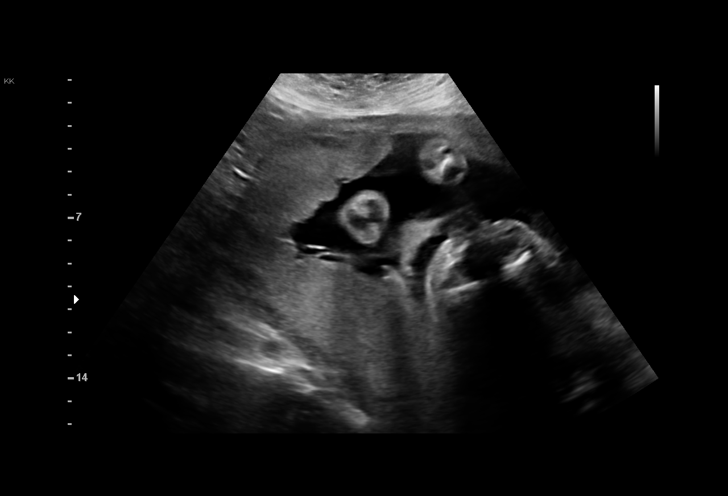
[im 19/34]
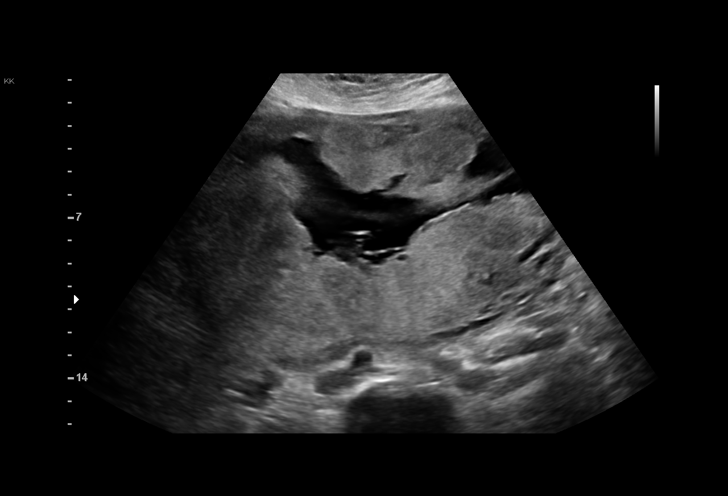
[im 21/34]
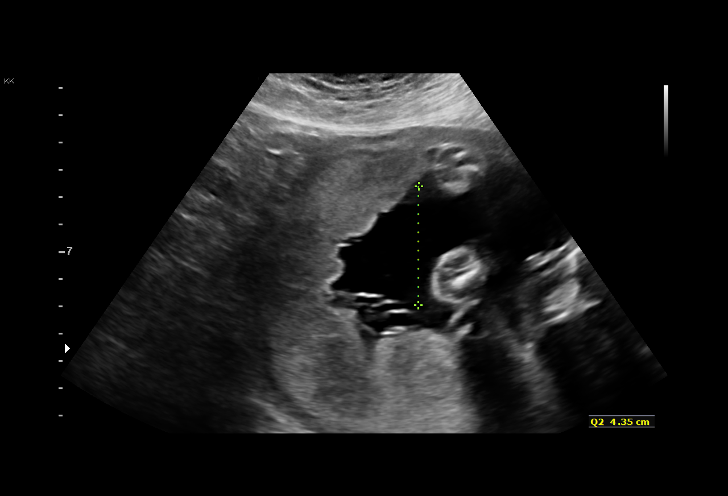
[im 24/34]
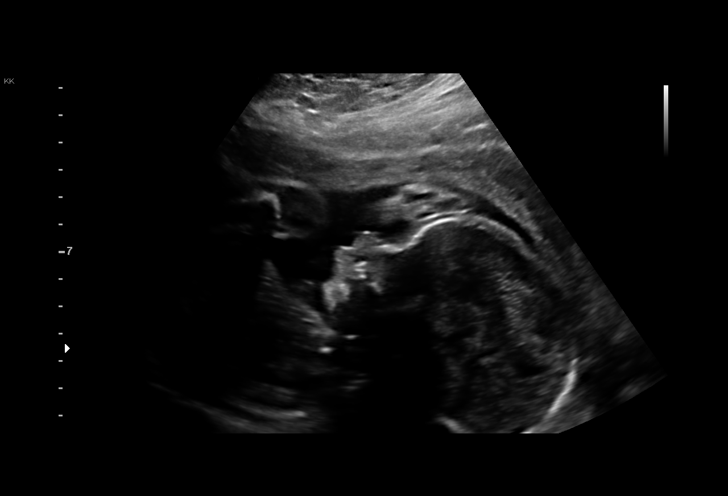
[im 26/34]
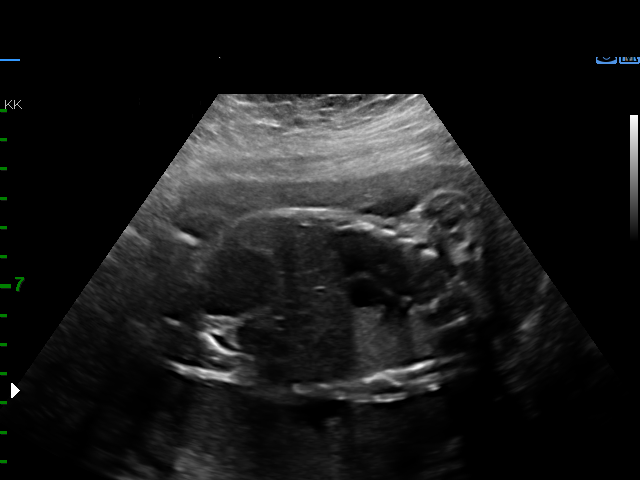
[im 29/34]
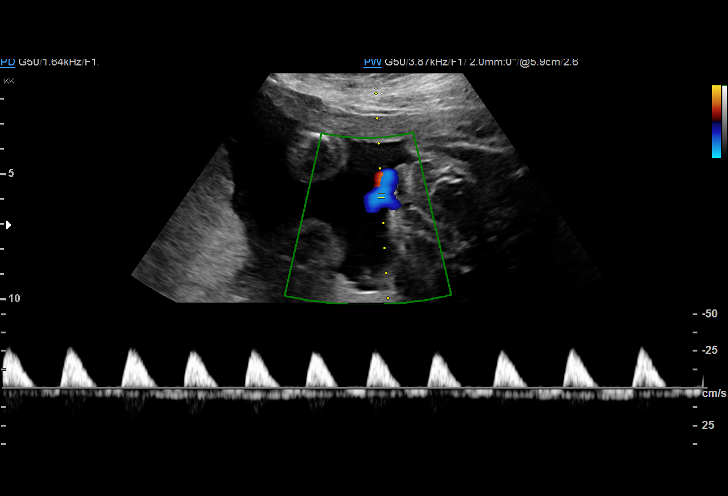
[im 31/34]
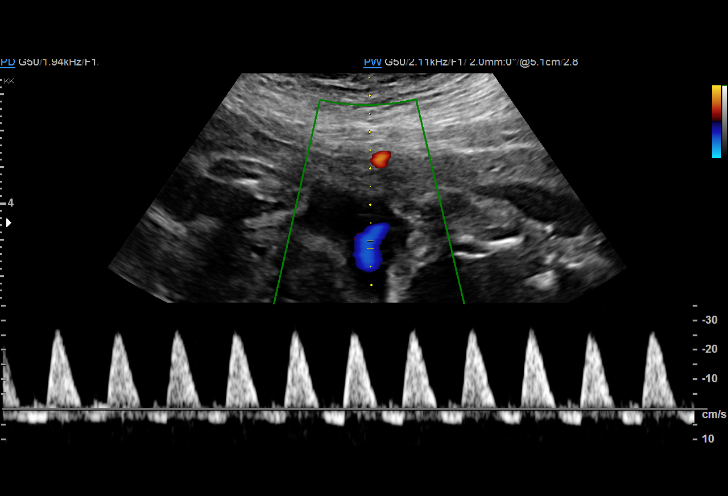
[im 34/34]
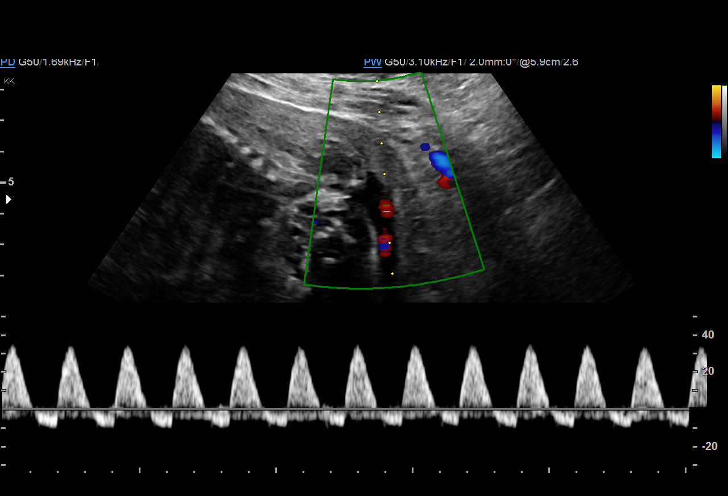

[15 of 28 positions shown; findings below may reference images not displayed]

1  COLQUE SORETA              [id number]      [id number]     [id number]
2  COLQUE SORETA              [id number]      [id number]     [id number]
Indications

28 weeks gestation of pregnancy
Advanced maternal age multigravida 35+,
third trimester; declined testing
Hypertension - Chronic/Pre-existing;
labetalol; ASA
Maternal care for known or suspected poor
fetal growth, third trimester, not applicable or
unspecified; s/p BMZ
OB History

Blood Type:            Height:  5'5"   Weight (lb):  202      BMI:
Gravidity:    2          SAB:   1
Living:       0
Fetal Evaluation

Num Of Fetuses:     1
Fetal Heart         151
Rate(bpm):
Cardiac Activity:   Observed
Presentation:       Cephalic
Placenta:           Posterior, above cervical os
P. Cord Insertion:  Previously Visualized

Amniotic Fluid
AFI FV:      Subjectively within normal limits

AFI Sum(cm)     %Tile       Largest Pocket(cm)
15.59           56
RUQ(cm)       RLQ(cm)       LUQ(cm)        LLQ(cm)
2.96
Biophysical Evaluation

Amniotic F.V:   Pocket => 2 cm two         F. Tone:        Observed
planes
F. Movement:    Observed                   Score:          [DATE]
F. Breathing:   Observed
Gestational Age

LMP:           28w 3d       Date:   10/18/15                 EDD:   07/24/16
Best:          28w 3d    Det. By:   LMP  (10/18/15)          EDD:   07/24/16
Doppler - Fetal Vessels

Umbilical Artery
ADFV    RDFV
Yes     Yes

Impression

SIUP at 28+3 weeks
Normal amniotic fluid volume
BPP [DATE]
UA dopplers continue to show both absent and reverse end
diastolic flow
Recommendations

Continue twice weekly fetal assessments
Growth US on [DATE]

## 2018-08-31 ENCOUNTER — Ambulatory Visit: Payer: BLUE CROSS/BLUE SHIELD | Admitting: Nurse Practitioner

## 2018-09-12 ENCOUNTER — Ambulatory Visit (INDEPENDENT_AMBULATORY_CARE_PROVIDER_SITE_OTHER): Payer: BLUE CROSS/BLUE SHIELD | Admitting: General Practice

## 2018-09-12 VITALS — BP 118/83 | HR 106 | Ht 65.0 in | Wt 225.0 lb

## 2018-09-12 DIAGNOSIS — Z3042 Encounter for surveillance of injectable contraceptive: Secondary | ICD-10-CM | POA: Diagnosis not present

## 2018-09-12 NOTE — Progress Notes (Signed)
Chart reviewed - agree with RN documentation.   

## 2018-09-12 NOTE — Progress Notes (Signed)
Suzanne [name] here for Depo-Provera  Injection.  Injection administered without complication. Patient will return in 3 months for next injection.  Marylynn Pearson, RN 09/12/2018  3:23 PM

## 2018-09-28 ENCOUNTER — Encounter: Payer: Self-pay | Admitting: Family Medicine

## 2018-09-28 ENCOUNTER — Ambulatory Visit: Payer: BLUE CROSS/BLUE SHIELD | Admitting: Medical

## 2018-11-29 ENCOUNTER — Ambulatory Visit (INDEPENDENT_AMBULATORY_CARE_PROVIDER_SITE_OTHER): Payer: BLUE CROSS/BLUE SHIELD

## 2018-11-29 ENCOUNTER — Other Ambulatory Visit: Payer: Self-pay

## 2018-11-29 DIAGNOSIS — Z3042 Encounter for surveillance of injectable contraceptive: Secondary | ICD-10-CM | POA: Diagnosis not present

## 2018-11-29 MED ORDER — MEDROXYPROGESTERONE ACETATE 150 MG/ML IM SUSP
150.0000 mg | Freq: Once | INTRAMUSCULAR | Status: AC
  Administered 2018-11-29: 150 mg via INTRAMUSCULAR

## 2018-11-29 NOTE — Progress Notes (Signed)
ATTESTATION OF SUPERVISION OF RN: Evaluation and management procedures were performed by the RN under my supervision and collaboration. I have reviewed the nursing note and chart and agree with the management and plan for this patient.  Samantha Weinhold, CNM  

## 2018-11-29 NOTE — Progress Notes (Signed)
Mariea Stable here for Depo-Provera  Injection.  Injection administered without complication. Patient will return in 3 months for next injection.  Ralene Bathe, RN 11/29/2018  3:38 PM

## 2019-02-14 ENCOUNTER — Telehealth: Payer: Self-pay | Admitting: Family Medicine

## 2019-02-14 ENCOUNTER — Ambulatory Visit: Payer: BLUE CROSS/BLUE SHIELD

## 2019-02-14 NOTE — Telephone Encounter (Signed)
Attempted to call patient about her appointment on 7/21 @ 3:00. No answer, left voicemail instructing patient to wear a face mask for the entire appointment and no visitors are allowed during the visit. Patient instructed not to attend the appointment if she was any symptoms. Symptom list and office number left.

## 2019-02-22 ENCOUNTER — Ambulatory Visit (INDEPENDENT_AMBULATORY_CARE_PROVIDER_SITE_OTHER): Payer: BC Managed Care – PPO

## 2019-02-22 ENCOUNTER — Other Ambulatory Visit: Payer: Self-pay

## 2019-02-22 VITALS — BP 125/95 | HR 89 | Wt 225.7 lb

## 2019-02-22 DIAGNOSIS — Z3042 Encounter for surveillance of injectable contraceptive: Secondary | ICD-10-CM | POA: Diagnosis not present

## 2019-02-22 MED ORDER — MEDROXYPROGESTERONE ACETATE 150 MG/ML IM SUSP
150.0000 mg | Freq: Once | INTRAMUSCULAR | Status: AC
  Administered 2019-02-22: 15:00:00 150 mg via INTRAMUSCULAR

## 2019-02-22 NOTE — Progress Notes (Signed)
Suzanne [name] here for Depo-Provera  Injection.  Injection administered without complication. Patient will return in 3 months for next injection.  Verdell Carmine, RN 02/22/2019  2:36 PM

## 2019-05-10 ENCOUNTER — Ambulatory Visit: Payer: BC Managed Care – PPO

## 2019-05-18 ENCOUNTER — Ambulatory Visit: Payer: BC Managed Care – PPO

## 2019-05-23 ENCOUNTER — Other Ambulatory Visit: Payer: Self-pay

## 2019-05-23 ENCOUNTER — Ambulatory Visit (INDEPENDENT_AMBULATORY_CARE_PROVIDER_SITE_OTHER): Payer: BC Managed Care – PPO

## 2019-05-23 VITALS — BP 136/92 | HR 93 | Wt 221.3 lb

## 2019-05-23 DIAGNOSIS — Z3042 Encounter for surveillance of injectable contraceptive: Secondary | ICD-10-CM | POA: Diagnosis not present

## 2019-05-23 NOTE — Progress Notes (Signed)
Wilber Oliphant here for Depo-Provera  Injection.  Injection administered without complication. Patient will return in 3 months for next injection.  Annabell Howells, RN 05/23/2019  1:56 PM

## 2019-05-23 NOTE — Progress Notes (Signed)
Patient seen and assessed by nursing staff during this encounter. I have reviewed the chart and agree with the documentation and plan.  Kerry Hough, PA-C 05/23/2019 2:19 PM

## 2019-08-08 ENCOUNTER — Ambulatory Visit: Payer: BC Managed Care – PPO

## 2019-08-21 ENCOUNTER — Other Ambulatory Visit: Payer: Self-pay

## 2019-08-21 ENCOUNTER — Ambulatory Visit (INDEPENDENT_AMBULATORY_CARE_PROVIDER_SITE_OTHER): Payer: BC Managed Care – PPO | Admitting: General Practice

## 2019-08-21 VITALS — BP 144/91 | HR 91 | Ht 66.0 in | Wt 226.0 lb

## 2019-08-21 DIAGNOSIS — Z3042 Encounter for surveillance of injectable contraceptive: Secondary | ICD-10-CM

## 2019-08-21 NOTE — Progress Notes (Signed)
Chart reviewed for nurse visit. Agree with plan of care.   [name] L, DO 08/21/2019 3:39 PM

## 2019-08-21 NOTE — Progress Notes (Signed)
Suzanne [name] Stable here for Depo-Provera  Injection.  Injection administered without complication. Patient will return in 3 months for next injection. Patient states she has been out of her BP med for 3-4 days and just picked up her prescription today.   Marylynn Pearson, RN 08/21/2019  2:46 PM

## 2019-10-09 ENCOUNTER — Ambulatory Visit: Payer: BC Managed Care – PPO | Admitting: Medical

## 2019-11-06 ENCOUNTER — Ambulatory Visit: Payer: BC Managed Care – PPO

## 2019-11-16 ENCOUNTER — Ambulatory Visit: Payer: BC Managed Care – PPO | Admitting: Obstetrics and Gynecology

## 2019-12-05 ENCOUNTER — Ambulatory Visit (INDEPENDENT_AMBULATORY_CARE_PROVIDER_SITE_OTHER): Payer: BC Managed Care – PPO | Admitting: Nurse Practitioner

## 2019-12-05 ENCOUNTER — Other Ambulatory Visit: Payer: Self-pay

## 2019-12-05 ENCOUNTER — Other Ambulatory Visit (HOSPITAL_COMMUNITY)
Admission: RE | Admit: 2019-12-05 | Discharge: 2019-12-05 | Disposition: A | Discharge status: Home / Self Care | Payer: BC Managed Care – PPO | Source: Ambulatory Visit | Attending: Medical | Admitting: Medical

## 2019-12-05 ENCOUNTER — Encounter: Payer: Self-pay | Admitting: Nurse Practitioner

## 2019-12-05 VITALS — BP 92/32 | HR 102 | Ht 65.0 in | Wt 233.5 lb

## 2019-12-05 DIAGNOSIS — Z01419 Encounter for gynecological examination (general) (routine) without abnormal findings: Secondary | ICD-10-CM | POA: Insufficient documentation

## 2019-12-05 DIAGNOSIS — K2100 Gastro-esophageal reflux disease with esophagitis, without bleeding: Secondary | ICD-10-CM

## 2019-12-05 DIAGNOSIS — I10 Essential (primary) hypertension: Secondary | ICD-10-CM

## 2019-12-05 DIAGNOSIS — Z3202 Encounter for pregnancy test, result negative: Secondary | ICD-10-CM

## 2019-12-05 DIAGNOSIS — Z3042 Encounter for surveillance of injectable contraceptive: Secondary | ICD-10-CM

## 2019-12-05 LAB — POCT PREGNANCY, URINE: Preg Test, Ur: NEGATIVE

## 2019-12-05 MED ORDER — MEDROXYPROGESTERONE ACETATE 150 MG/ML IM SUSP
150.0000 mg | Freq: Once | INTRAMUSCULAR | Status: AC
  Administered 2019-12-05: 150 mg via INTRAMUSCULAR

## 2019-12-05 NOTE — Progress Notes (Signed)
GYNECOLOGY ANNUAL PREVENTATIVE CARE ENCOUNTER NOTE  Subjective:   Suzanne [name] is a 44 y.o. [phone] female here for a routine annual gynecologic exam.  Current complaints: back pain from "sleeping wrong".   Denies abnormal vaginal bleeding, discharge, pelvic pain, problems with intercourse or other gynecologic concerns.  [age] year old child doing well - was born premature and in NICU for 2 months.  Sleeps with her mom in a queen sized bed and sleeping is difficult with toddler who is an active sleeper.   Gynecologic History No LMP recorded. Patient has had an injection. Contraception: Depo-Provera injections Last Pap: 12/2015. Results were: normal Last mammogram: over one year ago - due again now. Results were: normal  Obstetric History OB History  Gravida Para Term Preterm AB Living  2 1   1 1 1   SAB TAB Ectopic Multiple Live Births  1     0 1    # Outcome Date GA Lbr Len/2nd Weight Sex Delivery Anes PTL Lv  2 Preterm 05/15/16 [redacted]w[redacted]d  1 lb 11.5 oz (0.78 kg) F CS-LTranv Spinal  LIV  1 SAB 08/2015            Past Medical History:  Diagnosis Date  . GERD (gastroesophageal reflux disease)   . Hypertension   . Sciatica     Past Surgical History:  Procedure Laterality Date  . CERVICAL BIOPSY    . CESAREAN SECTION N/A 05/15/2016   Procedure: CESAREAN SECTION;  Surgeon: 05/17/2016, MD;  Location: [facility] BIRTHING SUITES;  Service: Obstetrics;  Laterality: N/A;  . COLPOSCOPY      Current Outpatient Medications on File Prior to Visit  Medication Sig Dispense Refill  . amLODipine (NORVASC) 10 MG tablet Take 1 tablet (10 mg total) by mouth daily. 30 tablet 0  . aspirin 81 MG chewable tablet Chew by mouth.    . hydrochlorothiazide (HYDRODIURIL) 25 MG tablet Take 25 mg by mouth daily.    [facility] ibuprofen (ADVIL,MOTRIN) 800 MG tablet Take 800 mg by mouth 3 (three) times daily.  2  . omeprazole (PRILOSEC) 20 MG capsule Take 20 mg by mouth daily.    Marland Kitchen w/o A Vit-FeFum-FePo-FA  (CONCEPT OB) 130-92.4-1 MG CAPS Take 1 capsule by mouth daily. 30 capsule 11   Current Facility-Administered Medications on File Prior to Visit  Medication Dose Route Frequency Provider Last Rate Last Admin  . medroxyPROGESTERone (DEPO-PROVERA) injection 150 mg  150 mg Intramuscular Q90 days Burnis Medin, PA-C   150 mg at 08/21/19 1508    Allergies  Allergen Reactions  . Coconut Flavor Nausea Only        . Latex Itching  . Pineapple Itching and Swelling    Tongue swelling  . Tomato Itching and Swelling    Tongue swelling.     Social History   Socioeconomic History  . Marital status: Single    Spouse name: Not on file  . Number of children: Not on file  . Years of education: Not on file  . Highest education level: Not on file  Occupational History  . Not on file  Tobacco Use  . Smoking status: Current Some Day Smoker    Packs/day: 0.30    Years: 15.00    Pack years: 4.50    Types: Cigarettes    Last attempt to quit: 01/02/2016    Years since quitting: 3.9  . Smokeless tobacco: Never Used  Substance and Sexual Activity  . Alcohol use: No  Comment: rare  . Drug use: Yes    Frequency: 1.0 times per week    Types: Marijuana  . Sexual activity: Not Currently    Birth control/protection: Injection  Other Topics Concern  . Not on file  Social History Narrative  . Not on file   Social Determinants of Health   Financial Resource Strain:   . Difficulty of Paying Living Expenses:   Food Insecurity: No Food Insecurity  . Worried About Programme researcher, broadcasting/film/video in the Last Year: Never true  . Ran Out of Food in the Last Year: Never true  Transportation Needs: No Transportation Needs  . Lack of Transportation (Medical): No  . Lack of Transportation (Non-Medical): No  Physical Activity:   . Days of Exercise per Week:   . Minutes of Exercise per Session:   Stress:   . Feeling of Stress :   Social Connections:   . Frequency of Communication with Friends and Family:   .  Frequency of Social Gatherings with Friends and Family:   . Attends Religious Services:   . Active Member of Clubs or Organizations:   . Attends Banker Meetings:   Marland Kitchen Marital Status:   Intimate Partner Violence:   . Fear of Current or Ex-Partner:   . Emotionally Abused:   Marland Kitchen Physically Abused:   . Sexually Abused:     Family History  Problem Relation Age of Onset  . Diabetes Mother   . Hypertension Mother   . GER disease Mother     The following portions of the patient's history were reviewed and updated as appropriate: allergies, current medications, past family history, past medical history, past social history, past surgical history and problem list.  Review of Systems Pertinent items noted in HPI and remainder of comprehensive ROS otherwise negative.   Objective:  BP (!) 92/32 (BP Location: Left Arm)   Pulse (!) 102   Ht 5\' 5"  (1.651 m)   Wt 233 lb 8 oz (105.9 kg)   BMI 38.86 kg/m  CONSTITUTIONAL: Well-developed, well-nourished female in no acute distress.  HENT:  Normocephalic, atraumatic, External right and left ear normal.  EYES: Conjunctivae and EOM are normal. Pupils are equal, round.  No scleral icterus.  NECK: Normal range of motion, supple, no masses.  Normal thyroid.  SKIN: Skin is warm and dry. No rash noted. Not diaphoretic. No erythema. No pallor. NEUROLOGIC: Alert and oriented to person, place, and time. Normal reflexes, muscle tone coordination. No cranial nerve deficit noted. PSYCHIATRIC: Normal mood and affect. Normal behavior. Normal judgment and thought content. CARDIOVASCULAR: Normal heart rate noted, regular rhythm RESPIRATORY: Clear to auscultation bilaterally. Effort and breath sounds normal, no problems with respiration noted. BREASTS: Symmetric in size. No masses, skin changes, nipple drainage, or lymphadenopathy. ABDOMEN: Soft, no distention noted.  No tenderness, rebound or guarding.  PELVIC: Normal appearing external genitalia;  normal appearing vaginal mucosa and cervix.  No abnormal discharge noted.  Two speculums used and client was so uncomfortable both with Graves and Peterson speculum.  Never was able to visualize cervix as client could not tolerate opening of the speculum once it was placed in the vagina.  Opened less than 1 cm and broom used to sweep where anterior lip could be seen. Pap smear obtained.  Bimanual exam limited due to habitus.  MUSCULOSKELETAL: Normal range of motion. No tenderness.  No cyanosis, clubbing, or edema.    Assessment and Plan:  1. Encounter for annual routine gynecological examination Will watch  for pap results  - Cytology - PAP( [place])  2. Encounter for surveillance of injectable contraceptive Will continue depo  - medroxyPROGESTERone (DEPO-PROVERA) injection 150 mg  3. Hypertension, benign Will follow up with PCP for continued management  4. Gastroesophageal reflux disease with esophagitis without hemorrhage PCP following  Will follow up results of pap smear and manage accordingly. Advised to schedule Mammogram Routine preventative health maintenance measures emphasized. Please refer to After Visit Summary for other counseling recommendations.    Earlie Server, RN, MSN, NP-BC Nurse Practitioner, [name] for [facility]

## 2019-12-07 LAB — CYTOLOGY - PAP
Chlamydia: NEGATIVE
Comment: NEGATIVE
Comment: NEGATIVE
Comment: NEGATIVE
Comment: NORMAL
Diagnosis: NEGATIVE
High risk HPV: NEGATIVE
Neisseria Gonorrhea: NEGATIVE
Trichomonas: NEGATIVE

## 2019-12-14 ENCOUNTER — Ambulatory Visit: Payer: BC Managed Care – PPO | Attending: Internal Medicine

## 2019-12-14 DIAGNOSIS — Z23 Encounter for immunization: Secondary | ICD-10-CM

## 2019-12-14 NOTE — Progress Notes (Signed)
   Covid-19 Vaccination Clinic  Name:  Suzanne [name]    MRN: [id number] DOB: 1975/12/01  12/14/2019  [name] was observed post Covid-19 immunization for 30 minutes based on pre-vaccination screening without incident. She was provided with Vaccine Information Sheet and instruction to access the V-Safe system.   [name] was instructed to call 911 with any severe reactions post vaccine: Marland Kitchen Difficulty breathing  . Swelling of face and throat  . A fast heartbeat  . A bad rash all over body  . Dizziness and weakness   Immunizations Administered    Name Date Dose VIS Date Route   Pfizer COVID-19 Vaccine 12/14/2019  3:47 PM 0.3 mL 09/20/2018 Intramuscular   Manufacturer: [employer], [employer]   Lot: [id number]   NDC: [address]-8

## 2020-01-08 ENCOUNTER — Ambulatory Visit: Payer: BC Managed Care – PPO | Attending: Internal Medicine

## 2020-01-08 DIAGNOSIS — Z23 Encounter for immunization: Secondary | ICD-10-CM

## 2020-01-08 NOTE — Progress Notes (Signed)
   Covid-19 Vaccination Clinic  Name:  Sofya Moustafa    MRN: [id number] DOB: 05-29-1976  01/08/2020  Ms. Akter was observed post Covid-19 immunization for 15 minutes without incident. She was provided with Vaccine Information Sheet and instruction to access the V-Safe system.   Ms. Marceaux was instructed to call 911 with any severe reactions post vaccine: Marland Kitchen Difficulty breathing  . Swelling of face and throat  . A fast heartbeat  . A bad rash all over body  . Dizziness and weakness   Immunizations Administered    Name Date Dose VIS Date Route   Pfizer COVID-19 Vaccine 01/08/2020  1:59 PM 0.3 mL 09/20/2018 Intramuscular   Manufacturer: [employer], [employer]   Lot: [id number]   NDC: [address]-2

## 2020-02-20 ENCOUNTER — Ambulatory Visit: Payer: BC Managed Care – PPO

## 2020-02-27 ENCOUNTER — Ambulatory Visit (INDEPENDENT_AMBULATORY_CARE_PROVIDER_SITE_OTHER): Payer: BC Managed Care – PPO

## 2020-02-27 ENCOUNTER — Other Ambulatory Visit: Payer: Self-pay

## 2020-02-27 VITALS — BP 140/97 | HR 97 | Wt 229.1 lb

## 2020-02-27 DIAGNOSIS — Z3042 Encounter for surveillance of injectable contraceptive: Secondary | ICD-10-CM | POA: Diagnosis not present

## 2020-02-27 MED ORDER — MEDROXYPROGESTERONE ACETATE 150 MG/ML IM SUSP
150.0000 mg | Freq: Once | INTRAMUSCULAR | Status: AC
  Administered 2020-02-27: 150 mg via INTRAMUSCULAR

## 2020-02-27 NOTE — Progress Notes (Signed)
Suzanne [name] here for Depo-Provera  Injection.  Injection administered without complication. Patient will return in 3 months for next injection.  Ralene Bathe, RN 02/27/2020  2:40 PM

## 2020-05-14 ENCOUNTER — Other Ambulatory Visit: Payer: Self-pay

## 2020-05-14 ENCOUNTER — Ambulatory Visit (INDEPENDENT_AMBULATORY_CARE_PROVIDER_SITE_OTHER): Payer: BC Managed Care – PPO

## 2020-05-14 VITALS — BP 153/103 | HR 86 | Wt 233.6 lb

## 2020-05-14 DIAGNOSIS — Z3042 Encounter for surveillance of injectable contraceptive: Secondary | ICD-10-CM

## 2020-05-14 MED ORDER — MEDROXYPROGESTERONE ACETATE 150 MG/ML IM SUSP
150.0000 mg | Freq: Once | INTRAMUSCULAR | Status: AC
  Administered 2020-05-14: 150 mg via INTRAMUSCULAR

## 2020-05-14 NOTE — Progress Notes (Signed)
Chart reviewed for nurse visit. Agree with plan of care.   Judeth Horn, NP 05/14/2020 4:59 PM

## 2020-05-14 NOTE — Progress Notes (Signed)
Suzanne [name] here for Depo-Provera Injection. Injection administered without complication. Patient will return in 3 months for next injection between 07/31/19 and 08/14/19. Next annual visit due 12/04/20.   Marjo Bicker, RN 05/14/2020  2:24 PM

## 2020-07-30 ENCOUNTER — Ambulatory Visit (INDEPENDENT_AMBULATORY_CARE_PROVIDER_SITE_OTHER): Payer: BC Managed Care – PPO | Admitting: General Practice

## 2020-07-30 ENCOUNTER — Other Ambulatory Visit: Payer: Self-pay

## 2020-07-30 VITALS — BP 144/105 | HR 124 | Ht 65.0 in | Wt 231.0 lb

## 2020-07-30 DIAGNOSIS — Z3042 Encounter for surveillance of injectable contraceptive: Secondary | ICD-10-CM | POA: Diagnosis not present

## 2020-07-30 NOTE — Progress Notes (Signed)
Chart reviewed for nurse visit. Agree with plan of care.   Piedad Standiford M, MD 07/30/20 5:05 PM 

## 2020-07-30 NOTE — Progress Notes (Signed)
Suzanne [name] Stable here for Depo-Provera Injection. Injection administered without complication. Patient will return in 3 months for next injection between Mar 22 and Apr 5. Next annual visit due May 2022. BP is elevated in office today- patient reports hx of HTN that she takes medicine for. States she hasn't been sleeping well lately so that may be affecting it. Advised she keep a close eye on it and follow up with PCP if it remains high.  Marylynn Pearson, RN 07/30/2020  2:47 PM

## 2020-10-15 ENCOUNTER — Ambulatory Visit: Payer: BC Managed Care – PPO

## 2020-10-23 ENCOUNTER — Ambulatory Visit: Payer: BC Managed Care – PPO

## 2020-10-30 ENCOUNTER — Ambulatory Visit: Payer: BC Managed Care – PPO

## 2021-03-01 ENCOUNTER — Other Ambulatory Visit: Payer: Self-pay

## 2021-03-01 ENCOUNTER — Emergency Department (HOSPITAL_COMMUNITY)
Admission: EM | Admit: 2021-03-01 | Discharge: 2021-03-01 | Disposition: A | Discharge status: Home / Self Care | Payer: BC Managed Care – PPO | Attending: Emergency Medicine | Admitting: Emergency Medicine

## 2021-03-01 DIAGNOSIS — Z91040 Latex allergy status: Secondary | ICD-10-CM | POA: Insufficient documentation

## 2021-03-01 DIAGNOSIS — M545 Low back pain, unspecified: Secondary | ICD-10-CM | POA: Diagnosis not present

## 2021-03-01 DIAGNOSIS — M5442 Lumbago with sciatica, left side: Secondary | ICD-10-CM | POA: Diagnosis not present

## 2021-03-01 DIAGNOSIS — Z79899 Other long term (current) drug therapy: Secondary | ICD-10-CM | POA: Diagnosis not present

## 2021-03-01 DIAGNOSIS — F17210 Nicotine dependence, cigarettes, uncomplicated: Secondary | ICD-10-CM | POA: Insufficient documentation

## 2021-03-01 DIAGNOSIS — M5432 Sciatica, left side: Secondary | ICD-10-CM

## 2021-03-01 DIAGNOSIS — I10 Essential (primary) hypertension: Secondary | ICD-10-CM | POA: Diagnosis not present

## 2021-03-01 DIAGNOSIS — Z7982 Long term (current) use of aspirin: Secondary | ICD-10-CM | POA: Diagnosis not present

## 2021-03-01 MED ORDER — PREDNISONE 20 MG PO TABS: 40.0000 mg | ORAL_TABLET | Freq: Every day | ORAL | 0 refills | Status: DC

## 2021-03-01 MED ORDER — HYDROMORPHONE HCL 1 MG/ML IJ SOLN
0.5000 mg | Freq: Once | INTRAMUSCULAR | Status: AC
Start: 2021-03-01 — End: 2021-03-01
  Administered 2021-03-01: 0.5 mg via INTRAMUSCULAR
  Filled 2021-03-01: qty 1

## 2021-03-01 MED ORDER — KETOROLAC TROMETHAMINE 15 MG/ML IJ SOLN
30.0000 mg | Freq: Once | INTRAMUSCULAR | Status: AC
  Administered 2021-03-01: 30 mg via INTRAMUSCULAR
  Filled 2021-03-01: qty 2

## 2021-03-01 MED ORDER — HYDROCODONE-ACETAMINOPHEN 5-325 MG PO TABS: 1.0000 | ORAL_TABLET | Freq: Four times a day (QID) | ORAL | 0 refills | Status: DC | PRN

## 2021-03-01 MED ORDER — DEXAMETHASONE 4 MG PO TABS
10.0000 mg | ORAL_TABLET | Freq: Once | ORAL | Status: AC
Start: 2021-03-01 — End: 2021-03-01
  Administered 2021-03-01: 10 mg via ORAL
  Filled 2021-03-01: qty 3

## 2021-03-01 NOTE — Discharge Instructions (Addendum)
Alternate ice and heat to areas of injury 3-4 times per day to limit inflammation and spasm.  Avoid strenuous activity and heavy lifting.  We recommend consistent use of prednisone as prescribed. You have been prescribed Norco to take as needed for severe pain.  Do not drive or drink alcohol after taking this medication as it may make you drowsy and impair your judgment.  We recommend follow-up with Neurosurgery given chronicity of your pain to ensure resolution of symptoms.  Return to the ED for any new or concerning symptoms.

## 2021-03-01 NOTE — ED Provider Notes (Signed)
[facility] EMERGENCY DEPARTMENT Provider Note   CSN: [id number] Arrival date & time: 03/01/21  0204     History Chief Complaint  Patient presents with   Sciatica    Suzanne [name] is a 45 y.o. female.  The history is provided by the patient. No language interpreter was used.  Back Pain Location:  Lumbar spine Radiates to: LLE. Pain severity:  Moderate Onset quality:  Gradual Duration:  1 week Timing:  Constant Progression:  Worsening Chronicity: acute on chronic. Context comment:  More active with daughter last week in the pool Relieved by:  Nothing Ineffective treatments:  NSAIDs Associated symptoms: no bladder incontinence, no bowel incontinence, no dysuria, no fever, no numbness, no paresthesias, no perianal numbness and no weakness   Risk factors: obesity   Risk factors comment:  Hx of sciatica of L side x 16 years     Past Medical History:  Diagnosis Date   GERD (gastroesophageal reflux disease)    Hypertension    Sciatica     Patient Active Problem List   Diagnosis Date Noted   GERD (gastroesophageal reflux disease) 01/09/2016   Hypertension, benign 01/09/2016    Past Surgical History:  Procedure Laterality Date   CERVICAL BIOPSY     CESAREAN SECTION N/A 05/15/2016   Procedure: CESAREAN SECTION;  Surgeon: Tilda Burrow, MD;  Location: [facility] BIRTHING SUITES;  Service: Obstetrics;  Laterality: N/A;   COLPOSCOPY       OB History     Gravida  2   Para  1   Term      Preterm  1   AB  1   Living  1      SAB  1   IAB      Ectopic      Multiple  0   Live Births  1           Family History  Problem Relation Age of Onset   Diabetes Mother    Hypertension Mother    GER disease Mother     Social History   Tobacco Use   Smoking status: Some Days    Packs/day: 0.30    Years: 15.00    Pack years: 4.50    Types: Cigarettes    Last attempt to quit: 01/02/2016    Years since quitting: 5.1   Smokeless tobacco:  Never  Substance Use Topics   Alcohol use: No    Comment: rare   Drug use: Yes    Frequency: 1.0 times per week    Types: Marijuana    Home Medications Prior to Admission medications   Medication Sig Start Date End Date Taking? Authorizing Provider  HYDROcodone-acetaminophen (NORCO/VICODIN) 5-325 MG tablet Take 1-2 tablets by mouth every 6 (six) hours as needed for severe pain. 03/01/21  Yes Antony Madura, PA-C  predniSONE (DELTASONE) 20 MG tablet Take 2 tablets (40 mg total) by mouth daily. Take 40 mg by mouth daily for 3 days, then 20mg  by mouth daily for 3 days, then 10mg  daily for 3 days 03/01/21  Yes , PA-C  amLODipine (NORVASC) 10 MG tablet Take 1 tablet (10 mg total) by mouth daily. 08/18/17   Antony Madura, PA-C  aspirin 81 MG chewable tablet Chew by mouth.    [provider]  hydrochlorothiazide (HYDRODIURIL) 25 MG tablet Take 25 mg by mouth daily.    [provider]  ibuprofen (ADVIL,MOTRIN) 800 MG tablet Take 800 mg by mouth 3 (three) times  daily. 04/25/18   [provider]  omeprazole (PRILOSEC) 20 MG capsule Take 20 mg by mouth daily.    [provider]  Prenat w/o A Vit-FeFum-FePo-FA (CONCEPT OB) 130-92.4-1 MG CAPS Take 1 capsule by mouth daily. Patient not taking: Reported on 05/14/2020 01/09/16   Reva Bores, MD    Allergies    Coconut flavor, Latex, Pineapple, and Tomato  Review of Systems   Review of Systems  Constitutional:  Negative for fever.  Gastrointestinal:  Negative for bowel incontinence.  Genitourinary:  Negative for bladder incontinence and dysuria.  Musculoskeletal:  Positive for back pain.  Neurological:  Negative for weakness, numbness and paresthesias.  Ten systems reviewed and are negative for acute change, except as noted in the HPI.    Physical Exam Updated Vital Signs BP (!) 140/91   Pulse 85   Temp 98.3 F (36.8 C) (Oral)   Resp 18   Ht 5\' 5"  (1.651 m)   Wt 104.8 kg   SpO2 93%   BMI 38.45  kg/m   Physical Exam Vitals and nursing note reviewed.  Constitutional:      General: She is not in acute distress.    Appearance: She is well-developed. She is not diaphoretic.     Comments: Obese, pleasant AA female  HENT:     Head: Normocephalic and atraumatic.  Eyes:     General: No scleral icterus.    Conjunctiva/sclera: Conjunctivae normal.  Pulmonary:     Effort: Pulmonary effort is normal. No respiratory distress.     Comments: Respirations even and unlabored Musculoskeletal:        General: Normal range of motion.     Cervical back: Normal range of motion.     Comments: Mild TTP to L low back without appreciable spasm  Skin:    General: Skin is warm and dry.     Coloration: Skin is not pale.     Findings: No erythema or rash.  Neurological:     Mental Status: She is alert and oriented to person, place, and time.     Coordination: Coordination normal.     Comments: GCS 15. Speech is goal oriented. Patient has 5/5 strength against resistance in all major muscle groups bilaterally. Sensation to light touch intact and equal throughout. Gait not assessed 2/2 pain.  Psychiatric:        Behavior: Behavior normal.    ED Results / Procedures / Treatments   Labs (all labs ordered are listed, but only abnormal results are displayed) Labs Reviewed - No data to display  EKG None  Radiology No results found.  Procedures Procedures   Medications Ordered in ED Medications  HYDROmorphone (DILAUDID) injection 0.5 mg (0.5 mg Intramuscular Given 03/01/21 0542)  ketorolac (TORADOL) 15 MG/ML injection 30 mg (30 mg Intramuscular Given 03/01/21 0542)  dexamethasone (DECADRON) tablet 10 mg (10 mg Oral Given 03/01/21 0542)    ED Course  I have reviewed the triage vital signs and the nursing notes.  Pertinent labs & imaging results that were available during my care of the patient were reviewed by me and considered in my medical decision making (see chart for details).  Clinical  Course as of 03/01/21 05/01/21  Sat Mar 01, 2021  Mar 03, 2021 Pain has improved from "11/10" down to "1/10". Reports she is feeling much better. [KH]    Clinical Course User Index [KH] 13/10, PA-C   MDM Rules/Calculators/A&P  Patient with back pain.  History of chronic sciatica x16 years.  Reports that pain is similar, but was exacerbated over the past week.  No history of direct fall/trauma.  Patient denies loss of bowel or bladder control.  No concern for cauda equina.  No fever, urinary symptoms, history of cancer, hx of IVDU.  RICE protocol and pain medicine indicated and discussed with patient.  Will refer to neurosurgery for follow-up given symptom chronicity.  Feeling better after supportive medications.  Will discharge with similar course.  Return precautions discussed and provided. Patient discharged in stable condition with no unaddressed concerns.   Final Clinical Impression(s) / ED Diagnoses Final diagnoses:  Left sided sciatica    Rx / DC Orders ED Discharge Orders          Ordered    HYDROcodone-acetaminophen (NORCO/VICODIN) 5-325 MG tablet  Every 6 hours PRN        03/01/21 0624    predniSONE (DELTASONE) 20 MG tablet  Daily        03/01/21 0624             Antony Madura, PA-C 03/01/21 6644    Sabas Sous, MD 03/01/21 [phone]

## 2021-03-01 NOTE — ED Triage Notes (Signed)
Left flank pain radiating all the way down to left leg. Hx of sciatica.

## 2021-05-31 DIAGNOSIS — R319 Hematuria, unspecified: Secondary | ICD-10-CM | POA: Diagnosis not present

## 2021-05-31 DIAGNOSIS — M545 Low back pain, unspecified: Secondary | ICD-10-CM | POA: Diagnosis not present

## 2021-05-31 DIAGNOSIS — R81 Glycosuria: Secondary | ICD-10-CM | POA: Diagnosis not present

## 2021-06-04 ENCOUNTER — Other Ambulatory Visit: Payer: Self-pay | Admitting: Urology

## 2021-06-04 DIAGNOSIS — R310 Gross hematuria: Secondary | ICD-10-CM | POA: Diagnosis not present

## 2021-06-04 DIAGNOSIS — R319 Hematuria, unspecified: Secondary | ICD-10-CM | POA: Diagnosis not present

## 2021-06-04 DIAGNOSIS — M545 Low back pain, unspecified: Secondary | ICD-10-CM | POA: Diagnosis not present

## 2021-06-10 DIAGNOSIS — R160 Hepatomegaly, not elsewhere classified: Secondary | ICD-10-CM | POA: Diagnosis not present

## 2021-06-10 DIAGNOSIS — I700 Atherosclerosis of aorta: Secondary | ICD-10-CM | POA: Diagnosis not present

## 2021-06-10 DIAGNOSIS — N200 Calculus of kidney: Secondary | ICD-10-CM | POA: Diagnosis not present

## 2021-06-10 DIAGNOSIS — N132 Hydronephrosis with renal and ureteral calculous obstruction: Secondary | ICD-10-CM | POA: Diagnosis not present

## 2021-06-10 DIAGNOSIS — K7689 Other specified diseases of liver: Secondary | ICD-10-CM | POA: Diagnosis not present

## 2021-06-10 DIAGNOSIS — R590 Localized enlarged lymph nodes: Secondary | ICD-10-CM | POA: Diagnosis not present

## 2021-06-10 DIAGNOSIS — R310 Gross hematuria: Secondary | ICD-10-CM | POA: Diagnosis not present

## 2021-06-10 DIAGNOSIS — K760 Fatty (change of) liver, not elsewhere classified: Secondary | ICD-10-CM | POA: Diagnosis not present

## 2021-06-23 ENCOUNTER — Emergency Department (HOSPITAL_COMMUNITY)
Admission: EM | Admit: 2021-06-23 | Discharge: 2021-06-24 | Discharge status: Against Medical Advice | Payer: BC Managed Care – PPO | Source: Home / Self Care

## 2021-06-23 ENCOUNTER — Encounter (HOSPITAL_COMMUNITY): Payer: Self-pay | Admitting: Emergency Medicine

## 2021-06-23 ENCOUNTER — Emergency Department (HOSPITAL_COMMUNITY)
Admission: EM | Admit: 2021-06-23 | Discharge: 2021-06-23 | Disposition: A | Discharge status: Home / Self Care | Payer: BC Managed Care – PPO | Attending: Student | Admitting: Student

## 2021-06-23 ENCOUNTER — Emergency Department (HOSPITAL_COMMUNITY): Payer: BC Managed Care – PPO

## 2021-06-23 ENCOUNTER — Other Ambulatory Visit: Payer: Self-pay

## 2021-06-23 ENCOUNTER — Encounter (HOSPITAL_COMMUNITY): Payer: Self-pay | Admitting: Pharmacy Technician

## 2021-06-23 DIAGNOSIS — Z5321 Procedure and treatment not carried out due to patient leaving prior to being seen by health care provider: Secondary | ICD-10-CM | POA: Insufficient documentation

## 2021-06-23 DIAGNOSIS — Z7984 Long term (current) use of oral hypoglycemic drugs: Secondary | ICD-10-CM | POA: Diagnosis not present

## 2021-06-23 DIAGNOSIS — M545 Low back pain, unspecified: Secondary | ICD-10-CM | POA: Diagnosis not present

## 2021-06-23 DIAGNOSIS — M549 Dorsalgia, unspecified: Secondary | ICD-10-CM | POA: Diagnosis not present

## 2021-06-23 DIAGNOSIS — M5430 Sciatica, unspecified side: Secondary | ICD-10-CM | POA: Insufficient documentation

## 2021-06-23 DIAGNOSIS — N1330 Unspecified hydronephrosis: Secondary | ICD-10-CM | POA: Diagnosis not present

## 2021-06-23 DIAGNOSIS — N200 Calculus of kidney: Secondary | ICD-10-CM | POA: Diagnosis not present

## 2021-06-23 DIAGNOSIS — E119 Type 2 diabetes mellitus without complications: Secondary | ICD-10-CM | POA: Insufficient documentation

## 2021-06-23 DIAGNOSIS — R109 Unspecified abdominal pain: Secondary | ICD-10-CM | POA: Diagnosis not present

## 2021-06-23 DIAGNOSIS — I700 Atherosclerosis of aorta: Secondary | ICD-10-CM | POA: Diagnosis not present

## 2021-06-23 DIAGNOSIS — K760 Fatty (change of) liver, not elsewhere classified: Secondary | ICD-10-CM | POA: Diagnosis not present

## 2021-06-23 LAB — COMPREHENSIVE METABOLIC PANEL
ALT: 24 U/L (ref 0–44)
AST: 22 U/L (ref 15–41)
Albumin: 4 g/dL (ref 3.5–5.0)
Alkaline Phosphatase: 91 U/L (ref 38–126)
Anion gap: 9 (ref 5–15)
BUN: 13 mg/dL (ref 6–20)
CO2: 30 mmol/L (ref 22–32)
Calcium: 10 mg/dL (ref 8.9–10.3)
Chloride: 98 mmol/L (ref 98–111)
Creatinine, Ser: 0.83 mg/dL (ref 0.44–1.00)
GFR, Estimated: >60 mL/min (ref >60)
Glucose, Bld: 162 mg/dL — ABNORMAL HIGH (ref 70–99)
Potassium: 3 mmol/L — ABNORMAL LOW (ref 3.5–5.1)
Sodium: 137 mmol/L (ref 135–145)
Total Bilirubin: 0.8 mg/dL (ref 0.3–1.2)
Total Protein: 7.7 g/dL (ref 6.5–8.1)

## 2021-06-23 LAB — CBC WITH DIFFERENTIAL/PLATELET
Abs Immature Granulocytes: 0.03 10*3/uL (ref 0.00–0.07)
Basophils Absolute: 0 10*3/uL (ref 0.0–0.1)
Basophils Relative: 0 %
Eosinophils Absolute: 0.1 10*3/uL (ref 0.0–0.5)
Eosinophils Relative: 1 %
HCT: 39 % (ref 36.0–46.0)
Hemoglobin: 13.2 g/dL (ref 12.0–15.0)
Immature Granulocytes: 0 %
Lymphocytes Relative: 34 %
Lymphs Abs: 4.2 10*3/uL — ABNORMAL HIGH (ref 0.7–4.0)
MCH: 32.7 pg (ref 26.0–34.0)
MCHC: 33.8 g/dL (ref 30.0–36.0)
MCV: 96.5 fL (ref 80.0–100.0)
Monocytes Absolute: 0.8 10*3/uL (ref 0.1–1.0)
Monocytes Relative: 6 %
Neutro Abs: 7.3 10*3/uL (ref 1.7–7.7)
Neutrophils Relative %: 59 %
Platelets: 367 10*3/uL (ref 150–400)
RBC: 4.04 MIL/uL (ref 3.87–5.11)
RDW: 12.5 % (ref 11.5–15.5)
WBC: 12.4 10*3/uL — ABNORMAL HIGH (ref 4.0–10.5)
nRBC: 0 % (ref 0.0–0.2)

## 2021-06-23 LAB — URINALYSIS, ROUTINE W REFLEX MICROSCOPIC
Glucose, UA: NEGATIVE mg/dL
Ketones, ur: NEGATIVE mg/dL
Nitrite: NEGATIVE
Protein, ur: 100 mg/dL — AB
Specific Gravity, Urine: 1.025 (ref 1.005–1.030)
pH: 6 (ref 5.0–8.0)

## 2021-06-23 LAB — LIPASE, BLOOD: Lipase: 29 U/L (ref 11–51)

## 2021-06-23 LAB — PREGNANCY, URINE: Preg Test, Ur: NEGATIVE

## 2021-06-23 LAB — URINALYSIS, MICROSCOPIC (REFLEX): RBC / HPF: >50 RBC/hpf (ref 0–5)

## 2021-06-23 NOTE — ED Notes (Signed)
Pt states she is calling a ride and when they get here I can take her name out d/t wait time

## 2021-06-23 NOTE — ED Triage Notes (Signed)
Pt here with reports of L sided flank pain onset several months ago. Pt states seen for same in the past without any diagnosis. States urinalysis showed hgb in her urine.

## 2021-06-23 NOTE — ED Provider Notes (Signed)
Emergency Medicine Provider Triage Evaluation Note  Suzanne [name] , a 45 y.o. female  was evaluated in triage.  Pt complains of left flank pain, back pain, sciatica.  The back and sciatica have been going on for a month, initially improved with pain medicine.  Started having left flank pain a few weeks ago, was seen at the urgent care but did not have any follow-up from then, was told she had blood in the urine was diabetic started metformin.  Here today because of left flank pain is still severe, not getting better.    Patient was seen 06/10/2021, CT abdomen is performed at that time which was notable for a kidney stone.  Patient reports she did not know she had a kidney stone, was not prescribed pain medicine or urology follow-up per the patient.  She is still having pain.  Review of Systems  Positive: Flank pain, back pain Negative: No dysuria or hematuria, fevers, nausea, vomiting.  Physical Exam  BP (!) 121/100   Pulse (!) 102   Temp 99 F (37.2 C)   Resp 20   SpO2 99%  Gen:   Awake, no distress   Resp:  Normal effort  MSK:   Moves extremities without difficulty  Other:  Left CVA tenderness  Medical Decision Making  Medically screening exam initiated at 4:49 PM.  Appropriate orders placed.  Suzanne [name] was informed that the remainder of the evaluation will be completed by another provider, this initial triage assessment does not replace that evaluation, and the importance of remaining in the ED until their evaluation is complete.  Abdominal labs, possible infected kidney stone?  We will also get a urine culture.   Theron Arista, PA-C 06/23/21 1652    Lorre Nick, MD 06/24/21 [phone]

## 2021-06-23 NOTE — ED Notes (Signed)
Pt leaving

## 2021-06-24 ENCOUNTER — Emergency Department (HOSPITAL_COMMUNITY): Payer: BC Managed Care – PPO

## 2021-06-24 ENCOUNTER — Encounter (HOSPITAL_COMMUNITY): Payer: Self-pay | Admitting: Emergency Medicine

## 2021-06-24 ENCOUNTER — Ambulatory Visit
Admission: RE | Admit: 2021-06-24 | Discharge: 2021-06-24 | Disposition: A | Discharge status: Home / Self Care | Payer: BC Managed Care – PPO | Source: Ambulatory Visit | Attending: Urology | Admitting: Urology

## 2021-06-24 DIAGNOSIS — R310 Gross hematuria: Secondary | ICD-10-CM

## 2021-06-24 LAB — CBC
HCT: 39.7 % (ref 36.0–46.0)
Hemoglobin: 13.2 g/dL (ref 12.0–15.0)
MCH: 32.1 pg (ref 26.0–34.0)
MCHC: 33.2 g/dL (ref 30.0–36.0)
MCV: 96.6 fL (ref 80.0–100.0)
Platelets: 376 10*3/uL (ref 150–400)
RBC: 4.11 MIL/uL (ref 3.87–5.11)
RDW: 12.5 % (ref 11.5–15.5)
WBC: 12.1 10*3/uL — ABNORMAL HIGH (ref 4.0–10.5)
nRBC: 0 % (ref 0.0–0.2)

## 2021-06-24 LAB — BASIC METABOLIC PANEL
Anion gap: 13 (ref 5–15)
BUN: 14 mg/dL (ref 6–20)
CO2: 28 mmol/L (ref 22–32)
Calcium: 10.1 mg/dL (ref 8.9–10.3)
Chloride: 96 mmol/L — ABNORMAL LOW (ref 98–111)
Creatinine, Ser: 0.72 mg/dL (ref 0.44–1.00)
GFR, Estimated: >60 mL/min (ref >60)
Glucose, Bld: 186 mg/dL — ABNORMAL HIGH (ref 70–99)
Potassium: 2.9 mmol/L — ABNORMAL LOW (ref 3.5–5.1)
Sodium: 137 mmol/L (ref 135–145)

## 2021-06-24 LAB — HCG, QUANTITATIVE, PREGNANCY: hCG, Beta Chain, Quant, S: 2 m[IU]/mL (ref <5)

## 2021-06-24 LAB — CBG MONITORING, ED: Glucose-Capillary: 194 mg/dL — ABNORMAL HIGH (ref 70–99)

## 2021-06-24 NOTE — ED Triage Notes (Signed)
Patient is complaining of Left flank pain that started several months ago. Patient states that she has been seen with same symptoms without any diagnosis. She states urinalysis showed hgb in her urine

## 2021-06-26 ENCOUNTER — Other Ambulatory Visit: Payer: BC Managed Care – PPO

## 2021-06-26 LAB — URINE CULTURE: Culture: 30000 — AB

## 2021-09-29 ENCOUNTER — Emergency Department (HOSPITAL_COMMUNITY): Payer: BC Managed Care – PPO

## 2021-09-29 ENCOUNTER — Encounter (HOSPITAL_COMMUNITY): Payer: Self-pay

## 2021-09-29 ENCOUNTER — Emergency Department (HOSPITAL_COMMUNITY)
Admission: EM | Admit: 2021-09-29 | Discharge: 2021-09-29 | Disposition: A | Discharge status: Home / Self Care | Payer: BC Managed Care – PPO | Attending: Emergency Medicine | Admitting: Emergency Medicine

## 2021-09-29 ENCOUNTER — Other Ambulatory Visit: Payer: Self-pay

## 2021-09-29 DIAGNOSIS — M25552 Pain in left hip: Secondary | ICD-10-CM | POA: Insufficient documentation

## 2021-09-29 DIAGNOSIS — W19XXXA Unspecified fall, initial encounter: Secondary | ICD-10-CM

## 2021-09-29 DIAGNOSIS — Z79899 Other long term (current) drug therapy: Secondary | ICD-10-CM | POA: Diagnosis not present

## 2021-09-29 DIAGNOSIS — W010XXA Fall on same level from slipping, tripping and stumbling without subsequent striking against object, initial encounter: Secondary | ICD-10-CM | POA: Diagnosis not present

## 2021-09-29 DIAGNOSIS — S96912A Strain of unspecified muscle and tendon at ankle and foot level, left foot, initial encounter: Secondary | ICD-10-CM | POA: Diagnosis not present

## 2021-09-29 DIAGNOSIS — Z7982 Long term (current) use of aspirin: Secondary | ICD-10-CM | POA: Insufficient documentation

## 2021-09-29 DIAGNOSIS — S99912A Unspecified injury of left ankle, initial encounter: Secondary | ICD-10-CM | POA: Diagnosis present

## 2021-09-29 DIAGNOSIS — Z91040 Latex allergy status: Secondary | ICD-10-CM | POA: Insufficient documentation

## 2021-09-29 MED ORDER — METHOCARBAMOL 500 MG PO TABS: 500.0000 mg | ORAL_TABLET | Freq: Three times a day (TID) | ORAL | 0 refills | Status: DC | PRN

## 2021-09-29 NOTE — ED Triage Notes (Addendum)
Pt here via [facility] from [facility]. Pt stepped on curb and fell around 0930 09/30/22. UC did not XR but rather sent pt to [facility]. Pt tearful in triage. 10/10 pain of left hip. Pulses intact, no shortening or rotation noted. ? ?180/98 ?HR 98 ?98% RA ?20 RR ?

## 2021-09-30 NOTE — ED Provider Notes (Signed)
?[facility] ?Provider Note ? ? ?CSN: [id number] ?Arrival date & time: 09/29/21  1105 ? ?  ? ?History ? ?Chief Complaint  ?Patient presents with  ? Hip Pain  ? ? ?Suzanne [name] is a 46 y.o. female. ? ? ?Hip Pain ?Patient presents with hip pain.  Left hip and ankle after stepping off a curb.  States she fell dropping off her daughter.  Had been seen in urgent care but did not get x-ray and sent here instead.  Did not hit head.  No numbness or weakness.  Has had some back pain in the past that does radiate down the leg. ? ?  ? ?Home Medications ?Prior to Admission medications   ?Medication Sig Start Date End Date Taking? Authorizing Provider  ?methocarbamol (ROBAXIN) 500 MG tablet Take 1 tablet (500 mg total) by mouth every 8 (eight) hours as needed for muscle spasms. 09/29/21  Yes Davonna Belling, MD  ?amLODipine (NORVASC) 10 MG tablet Take 1 tablet (10 mg total) by mouth daily. 08/18/17   Luvenia Redden, PA-C  ?aspirin 81 MG chewable tablet Chew by mouth.    [provider]  ?hydrochlorothiazide (HYDRODIURIL) 25 MG tablet Take 25 mg by mouth daily.    [provider]  ?HYDROcodone-acetaminophen (NORCO/VICODIN) 5-325 MG tablet Take 1-2 tablets by mouth every 6 (six) hours as needed for severe pain. 03/01/21   Antonietta Breach, PA-C  ?ibuprofen (ADVIL,MOTRIN) 800 MG tablet Take 800 mg by mouth 3 (three) times daily. 04/25/18   [provider]  ?omeprazole (PRILOSEC) 20 MG capsule Take 20 mg by mouth daily.    [provider]  ?predniSONE (DELTASONE) 20 MG tablet Take 2 tablets (40 mg total) by mouth daily. Take 40 mg by mouth daily for 3 days, then 20mg  by mouth daily for 3 days, then 10mg  daily for 3 days 03/01/21   Antonietta Breach, PA-C  ?Prenat w/o A Vit-FeFum-FePo-FA (CONCEPT OB) 130-92.4-1 MG CAPS Take 1 capsule by mouth daily. ?Patient not taking: Reported on 05/14/2020 01/09/16   Donnamae Jude, MD  ?   ? ?Allergies    ?Coconut flavor, Latex,  Pineapple, and Tomato   ? ?Review of Systems   ?Review of Systems  ?Constitutional:  Negative for chills.  ?Neurological:  Negative for weakness and numbness.  ? ?Physical Exam ?Updated Vital Signs ?BP (!) 137/98   Pulse 80   Temp 98.1 ?F (36.7 ?C)   Resp 16   Ht 5\' 5"  (1.651 m)   Wt 104.3 kg   SpO2 98%   BMI 38.27 kg/m?  ?Physical Exam ?Vitals reviewed.  ?Musculoskeletal:  ?   Cervical back: Neck supple.  ?   Comments: Tenderness at left hip.  Mildly decreased range of motion.  Also tenderness to left ankle distal to the lateral malleolus.  No bony tenderness on the foot but does have some tenderness along the lateral ligaments.  Knee stable.  No tenderness.  No lumbar tenderness.  ?Neurological:  ?   Mental Status: She is alert.  ? ? ?ED Results / Procedures / Treatments   ?Labs ?(all labs ordered are listed, but only abnormal results are displayed) ?Labs Reviewed - No data to display ? ?EKG ?None ? ?Radiology ?DG Ankle Complete Left ? ?Result Date: 09/29/2021 ?CLINICAL DATA:  Trauma, fall EXAM: LEFT ANKLE COMPLETE - 3+ VIEW COMPARISON:  None FINDINGS: No recent fracture or dislocation is seen. Plantar spur is seen in calcaneus. IMPRESSION: No fracture or dislocation is seen  in the left ankle. Plantar spur is seen in calcaneus. Electronically Signed   By: Elmer Picker M.D.   On: 09/29/2021 12:18  ? ?DG Hip Unilat W or Wo Pelvis 2-3 Views Left ? ?Result Date: 09/29/2021 ?CLINICAL DATA:  Trauma, fall, pain EXAM: DG HIP (WITH OR WITHOUT PELVIS) 2-3V LEFT COMPARISON:  None. FINDINGS: No recent fracture or dislocation is seen in the pelvis and left hip. There is no significant joint space narrowing. IMPRESSION: No fracture or dislocation is seen. Electronically Signed   By: Elmer Picker M.D.   On: 09/29/2021 12:19   ? ?Procedures ?Procedures  ? ? ?Medications Ordered in ED ?Medications - No data to display ? ?ED Course/ Medical Decision Making/ A&P ?  ?                        ?Medical Decision  Making ?Amount and/or Complexity of Data Reviewed ?Radiology: ordered. ? ?Risk ?Prescription drug management. ? ? ?Patient with fall.  Twisted left ankle and pain on left knee from where she fell.  Imaging of both reassuring.  Independently interpreted by me.  No fracture seen.  Patient is relieved and eager to go home.  Doubt occult fracture but there is a possibility.  We will follow-up with does not improve.  Muscle relaxer given for symptomatic treatment. ? ? ? ? ? ? ? ?Final Clinical Impression(s) / ED Diagnoses ?Final diagnoses:  ?Fall, initial encounter  ?Left hip pain  ?Ankle strain, left, initial encounter  ? ? ?Rx / DC Orders ?ED Discharge Orders   ? ?      Ordered  ?  methocarbamol (ROBAXIN) 500 MG tablet  Every 8 hours PRN       ? 09/29/21 1244  ? ?  ?  ? ?  ? ? ?  ?Davonna Belling, MD ?09/30/21 1247 ? ?

## 2021-11-09 ENCOUNTER — Encounter (HOSPITAL_BASED_OUTPATIENT_CLINIC_OR_DEPARTMENT_OTHER): Payer: Self-pay

## 2021-11-09 ENCOUNTER — Emergency Department (HOSPITAL_COMMUNITY)
Admission: EM | Admit: 2021-11-09 | Discharge: 2021-11-09 | Discharge status: Against Medical Advice | Payer: BC Managed Care – PPO | Attending: Emergency Medicine | Admitting: Emergency Medicine

## 2021-11-09 ENCOUNTER — Other Ambulatory Visit: Payer: Self-pay

## 2021-11-09 ENCOUNTER — Encounter (HOSPITAL_COMMUNITY): Payer: Self-pay

## 2021-11-09 ENCOUNTER — Emergency Department (HOSPITAL_BASED_OUTPATIENT_CLINIC_OR_DEPARTMENT_OTHER)
Admission: EM | Admit: 2021-11-09 | Discharge: 2021-11-09 | Disposition: A | Discharge status: Home / Self Care | Payer: BC Managed Care – PPO | Source: Home / Self Care | Attending: Emergency Medicine | Admitting: Emergency Medicine

## 2021-11-09 DIAGNOSIS — Z7982 Long term (current) use of aspirin: Secondary | ICD-10-CM | POA: Insufficient documentation

## 2021-11-09 DIAGNOSIS — Z5321 Procedure and treatment not carried out due to patient leaving prior to being seen by health care provider: Secondary | ICD-10-CM | POA: Diagnosis not present

## 2021-11-09 DIAGNOSIS — Z91040 Latex allergy status: Secondary | ICD-10-CM | POA: Insufficient documentation

## 2021-11-09 DIAGNOSIS — M5430 Sciatica, unspecified side: Secondary | ICD-10-CM | POA: Diagnosis present

## 2021-11-09 DIAGNOSIS — M5417 Radiculopathy, lumbosacral region: Secondary | ICD-10-CM | POA: Insufficient documentation

## 2021-11-09 HISTORY — DX: Type 2 diabetes mellitus without complications: E11.9

## 2021-11-09 MED ORDER — HYDROMORPHONE HCL 1 MG/ML IJ SOLN
1.0000 mg | Freq: Once | INTRAMUSCULAR | Status: AC
  Administered 2021-11-09: 1 mg via INTRAMUSCULAR
  Filled 2021-11-09: qty 1

## 2021-11-09 MED ORDER — IBUPROFEN 400 MG PO TABS
400.0000 mg | ORAL_TABLET | Freq: Once | ORAL | Status: AC
  Administered 2021-11-09: 400 mg via ORAL
  Filled 2021-11-09: qty 1

## 2021-11-09 MED ORDER — KETOROLAC TROMETHAMINE 60 MG/2ML IM SOLN
30.0000 mg | Freq: Once | INTRAMUSCULAR | Status: DC
  Filled 2021-11-09: qty 2

## 2021-11-09 MED ORDER — HYDROCODONE-ACETAMINOPHEN 5-325 MG PO TABS
1.0000 | ORAL_TABLET | Freq: Four times a day (QID) | ORAL | 0 refills | Status: DC | PRN
Start: 2021-11-09 — End: 2021-11-13

## 2021-11-09 MED ORDER — HYDROCODONE-ACETAMINOPHEN 5-325 MG PO TABS
1.0000 | ORAL_TABLET | Freq: Once | ORAL | Status: AC
  Administered 2021-11-09: 1 via ORAL
  Filled 2021-11-09: qty 1

## 2021-11-09 NOTE — Discharge Instructions (Signed)

## 2021-11-09 NOTE — ED Triage Notes (Signed)
Pt arrives to ED to be evaluated for sciatica related pain -- pt reports pain started 10/30/2021  prior to arrival after moving "stuff" and has since gotten worse-- pain starts at L hip and radiates down to L foot;  pt seen at an [facility] out of town 11/03/2021 and was prescribed a steroid pack and muscle relaxer -- pt states she was starting to get relief from these meds however "slipped one of my daughter's toys and that made it worse" - pt states this incident occurred 1 day pta   ?

## 2021-11-09 NOTE — ED Provider Notes (Signed)
?[facility] ?Provider Note ? ? ?CSN: [id number] ?Arrival date & time: 11/09/21  0426 ? ?  ? ?History ? ?Chief Complaint  ?Patient presents with  ? Hip Pain  ? ? ?Suzanne [name] is a 46 y.o. female. ? ?The history is provided by the patient.  ? ?  ?Patient presents with continued pain in her left buttock into her left leg.  This started approximately 10 days ago after moving.  Is progressively gotten worse since that time.  She had been seen in an urgent care out of town and given steroids and a muscle relaxant with some improvement.  Earlier tonight she slipped on some toys and made the pain worse.  She did not fall or injure herself that time.  No new leg weakness.  No incontinence is reported.  No history of back surgery ?Home Medications ?Prior to Admission medications   ?Medication Sig Start Date End Date Taking? Authorizing Provider  ?HYDROcodone-acetaminophen (NORCO/VICODIN) 5-325 MG tablet Take 1 tablet by mouth every 6 (six) hours as needed. 11/09/21  Yes Zadie Rhine, MD  ?METFORMIN HCL PO Take by mouth daily.   Yes [provider]  ?amLODipine (NORVASC) 10 MG tablet Take 1 tablet (10 mg total) by mouth daily. 08/18/17   Marny Lowenstein, PA-C  ?aspirin 81 MG chewable tablet Chew by mouth.    [provider]  ?hydrochlorothiazide (HYDRODIURIL) 25 MG tablet Take 25 mg by mouth daily.    [provider]  ?ibuprofen (ADVIL,MOTRIN) 800 MG tablet Take 800 mg by mouth 3 (three) times daily. 04/25/18   [provider]  ?omeprazole (PRILOSEC) 20 MG capsule Take 20 mg by mouth daily.    [provider]  ?Prenat w/o A Vit-FeFum-FePo-FA (CONCEPT OB) 130-92.4-1 MG CAPS Take 1 capsule by mouth daily. ?Patient not taking: Reported on 05/14/2020 01/09/16   Reva Bores, MD  ?   ? ?Allergies    ?Coconut flavor, Latex, Pineapple, and Tomato   ? ?Review of Systems   ?Review of Systems  ?Constitutional:  Negative for fever.  ?Musculoskeletal:  Positive  for back pain.  ?Neurological:  Negative for weakness.  ? ?Physical Exam ?Updated Vital Signs ?BP 136/81 (BP Location: Left Arm)   Pulse 83   Temp 97.6 ?F (36.4 ?C) (Oral)   Resp 16   Ht 1.676 m (5\' 6" )   Wt 93.9 kg   SpO2 100%   BMI 33.41 kg/m?  ?Physical Exam ?CONSTITUTIONAL: Well developed/well nourished ?HEAD: Normocephalic/atraumatic ?EYES: EOMI ?ENMT: Mucous membranes moist ?NECK: supple no meningeal signs ?SPINE/BACK:entire spine nontender  ?CV: S1/S2 noted, no murmurs/rubs/gallops noted ?LUNGS: Lungs are clear to auscultation bilaterally, no apparent distress ?ABDOMEN: soft, nontender, no rebound or guarding ?GU:no cva tenderness ?NEURO: Awake/alert,equal motor 5/5 strength noted with the following: hip flexion/knee flexion/extension, foot dorsi/plantar flexion, great toe extension intact bilaterally, no sensory deficit in any dermatome.   ?EXTREMITIES: pulses normal, full ROM, tenderness noted in the left buttock region but no bruising or crepitus ?SKIN: warm, color normal ?PSYCH: Anxious ?ED Results / Procedures / Treatments   ?Labs ?(all labs ordered are listed, but only abnormal results are displayed) ?Labs Reviewed - No data to display ? ?EKG ?None ? ?Radiology ?No results found. ? ?Procedures ?Procedures  ? ? ?Medications Ordered in ED ?Medications  ?HYDROmorphone (DILAUDID) injection 1 mg (1 mg Intramuscular Given 11/09/21 0526)  ?HYDROcodone-acetaminophen (NORCO/VICODIN) 5-325 MG per tablet 1 tablet (1 tablet Oral Given 11/09/21 11/11/21)  ?ibuprofen (ADVIL) tablet 400 mg (400  mg Oral Given 11/09/21 0618)  ? ? ?ED Course/ Medical Decision Making/ A&P ?Clinical Course as of 11/09/21 0706  ?Sun Nov 09, 2021  ?5093 Patient with some relief from the Dilaudid.  She is able to move around the bed freely.  Upon standing out she was able to take a few steps but it does elicit more pain.  We will try another round of medicines.  However I do feel she is safe for discharge home but may end up needing  outpatient MRI. [DW]  ?0705 No signs of any acute neurologic emergency.  Patient be discharged home.  She has some improvement.  We discussed strict return precaution [DW]  ?  ?Clinical Course User Index ?[DW] Zadie Rhine, MD  ? ?                        ?Medical Decision Making ?Risk ?Prescription drug management. ? ? ?This patient presents to the ED for concern of back and left buttock pain, this involves an extensive number of treatment options, and is a complaint that carries with it a high risk of complications and morbidity.  The differential diagnosis includes lumbosacral radiculopathy, epidural abscess, discitis, muscle strain ? ?Comorbidities that complicate the patient evaluation: ?Patient?s presentation is complicated by their history of obesity ? ?Additional history obtained: ?Additional history obtained from family ?Records reviewed  previous consult notes noted ? ?Medicines ordered and prescription drug management: ?I ordered medication including Dilaudid for pain ?Reevaluation of the patient after these medicines showed that the patient    improved ? ?Test Considered: ?Considered imaging, the patient is improving without neurodeficits, no indication for emergent MRI ? ? ?Reevaluation: ?After the interventions noted above, I reevaluated the patient and found that they have :improved ? ?Complexity of problems addressed: ?Patient?s presentation is most consistent with  acute, uncomplicated illness ? ?Disposition: ?After consideration of the diagnostic results and the patient?s response to treatment,  ?I feel that the patent would benefit from discharge   .  ? ? ? ? ? ? ? ? ? ?Final Clinical Impression(s) / ED Diagnoses ?Final diagnoses:  ?Lumbosacral radiculopathy  ? ? ?Rx / DC Orders ?ED Discharge Orders   ? ?      Ordered  ?  HYDROcodone-acetaminophen (NORCO/VICODIN) 5-325 MG tablet  Every 6 hours PRN       ? 11/09/21 2671  ? ?  ?  ? ?  ? ? ?  ?Zadie Rhine, MD ?11/09/21 [phone] ? ?

## 2021-11-09 NOTE — ED Triage Notes (Signed)
Pt arrived POV from home c/o sciatic pain. Pt states she was seen in [place] on Monday and given medication but it has only gotten worse. Pt tearful in triage.  ?

## 2021-11-13 ENCOUNTER — Emergency Department (HOSPITAL_BASED_OUTPATIENT_CLINIC_OR_DEPARTMENT_OTHER)
Admission: EM | Admit: 2021-11-13 | Discharge: 2021-11-13 | Disposition: A | Discharge status: Home / Self Care | Payer: BC Managed Care – PPO | Attending: Emergency Medicine | Admitting: Emergency Medicine

## 2021-11-13 ENCOUNTER — Encounter (HOSPITAL_BASED_OUTPATIENT_CLINIC_OR_DEPARTMENT_OTHER): Payer: Self-pay | Admitting: Emergency Medicine

## 2021-11-13 ENCOUNTER — Other Ambulatory Visit (HOSPITAL_BASED_OUTPATIENT_CLINIC_OR_DEPARTMENT_OTHER): Payer: Self-pay

## 2021-11-13 ENCOUNTER — Other Ambulatory Visit: Payer: Self-pay

## 2021-11-13 DIAGNOSIS — I10 Essential (primary) hypertension: Secondary | ICD-10-CM | POA: Insufficient documentation

## 2021-11-13 DIAGNOSIS — Z91040 Latex allergy status: Secondary | ICD-10-CM | POA: Diagnosis not present

## 2021-11-13 DIAGNOSIS — E119 Type 2 diabetes mellitus without complications: Secondary | ICD-10-CM | POA: Diagnosis not present

## 2021-11-13 DIAGNOSIS — Z7982 Long term (current) use of aspirin: Secondary | ICD-10-CM | POA: Insufficient documentation

## 2021-11-13 DIAGNOSIS — M5432 Sciatica, left side: Secondary | ICD-10-CM | POA: Diagnosis not present

## 2021-11-13 DIAGNOSIS — Z79899 Other long term (current) drug therapy: Secondary | ICD-10-CM | POA: Insufficient documentation

## 2021-11-13 DIAGNOSIS — Z7984 Long term (current) use of oral hypoglycemic drugs: Secondary | ICD-10-CM | POA: Insufficient documentation

## 2021-11-13 DIAGNOSIS — M549 Dorsalgia, unspecified: Secondary | ICD-10-CM | POA: Diagnosis present

## 2021-11-13 MED ORDER — HYDROCODONE-ACETAMINOPHEN 5-325 MG PO TABS
1.0000 | ORAL_TABLET | Freq: Once | ORAL | Status: AC
  Administered 2021-11-13: 1 via ORAL
  Filled 2021-11-13: qty 1

## 2021-11-13 MED ORDER — HYDROCODONE-ACETAMINOPHEN 5-325 MG PO TABS
1.0000 | ORAL_TABLET | Freq: Four times a day (QID) | ORAL | 0 refills | Status: DC | PRN
  Filled 2021-11-13: qty 10, 3d supply, fill #0

## 2021-11-13 MED ORDER — GABAPENTIN 100 MG PO CAPS
100.0000 mg | ORAL_CAPSULE | Freq: Three times a day (TID) | ORAL | 0 refills | Status: DC | PRN
  Filled 2021-11-13: qty 30, 10d supply, fill #0

## 2021-11-13 NOTE — ED Triage Notes (Signed)
Pt arrives to ED with c/o lumbosacral back pain. She reports pain in her left buttocks into her left leg. She reports that started around 2 weeks ago. Was seen in ER on 4/16 for same. No incontinence or leg weakness.  ?

## 2021-11-13 NOTE — ED Provider Notes (Signed)
?[facility] ?Provider Note ? ? ?CSN: [id number] ?Arrival date & time: 11/13/21  0175 ? ?  ? ?History ? ?Chief Complaint  ?Patient presents with  ? Back Pain  ? ? ?Suzanne [name] is a 46 y.o. female. ? ?Patient is a 46 year old female with a history of hypertension, GERD, diabetes and sciatica who is presenting today with worsening back pain that goes down into her left leg.  Patient reports that she has been having these issues for years but will just have flares intermittently.  This flare started on 6 April.  She thinks it was exacerbated when she went to the beach and was walking in the sand.  She initially saw an outside urgent care and was given muscle relaxer and steroid Dosepak.  However she then had a slip on something at home and made the symptoms worse and was seen in the emergency room 4 days ago for worsening pain.  She was given pain medication and she had been off work and had been feeling better overall but went back to work yesterday and reported when she got home last night her symptoms had intensified.  She describes it as a sharp gnawing deep pain that goes from the left side of her back all the way down her left leg.  In the last 4 hours she is also felt numbness and tingling in the leg from the top all the way down to her toe.  She has not had any weakness in the leg or any dragging of her foot.  She has no bowel or bladder incontinence or urinary retention.  She denies any fevers.  She finished her steroid pack 2 days ago and has continued to take the anti-inflammatory and muscle relaxers. ? ?The history is provided by the patient and medical records.  ?Back Pain ? ?  ? ?Home Medications ?Prior to Admission medications   ?Medication Sig Start Date End Date Taking? Authorizing Provider  ?gabapentin (NEURONTIN) 100 MG capsule Take 1 capsule (100 mg total) by mouth 3 (three) times daily with meals as needed (for nerve pain). 11/13/21  Yes Gwyneth Sprout, MD   ?HYDROcodone-acetaminophen (NORCO/VICODIN) 5-325 MG tablet Take 1 tablet by mouth every 6 (six) hours as needed. 11/13/21  Yes Gwyneth Sprout, MD  ?amLODipine (NORVASC) 10 MG tablet Take 1 tablet (10 mg total) by mouth daily. 08/18/17   Marny Lowenstein, PA-C  ?aspirin 81 MG chewable tablet Chew by mouth.    [provider]  ?hydrochlorothiazide (HYDRODIURIL) 25 MG tablet Take 25 mg by mouth daily.    [provider]  ?ibuprofen (ADVIL,MOTRIN) 800 MG tablet Take 800 mg by mouth 3 (three) times daily. 04/25/18   [provider]  ?METFORMIN HCL PO Take by mouth daily.    [provider]  ?omeprazole (PRILOSEC) 20 MG capsule Take 20 mg by mouth daily.    [provider]  ?Prenat w/o A Vit-FeFum-FePo-FA (CONCEPT OB) 130-92.4-1 MG CAPS Take 1 capsule by mouth daily. ?Patient not taking: Reported on 05/14/2020 01/09/16   Reva Bores, MD  ?   ? ?Allergies    ?Coconut flavor, Latex, Pineapple, and Tomato   ? ?Review of Systems   ?Review of Systems  ?Musculoskeletal:  Positive for back pain.  ? ?Physical Exam ?Updated Vital Signs ?BP 111/85   Pulse 90   Temp 97.9 ?F (36.6 ?C) (Oral)   Resp 18   SpO2 99%  ?Physical Exam ?Vitals and nursing note reviewed.  ?Constitutional:   ?  General: She is not in acute distress. ?   Appearance: She is well-developed.  ?HENT:  ?   Head: Normocephalic and atraumatic.  ?Eyes:  ?   Pupils: Pupils are equal, round, and reactive to light.  ?Cardiovascular:  ?   Rate and Rhythm: Normal rate and regular rhythm.  ?   Pulses: Normal pulses.  ?   Heart sounds: Normal heart sounds. No murmur heard. ?  No friction rub.  ?Pulmonary:  ?   Effort: Pulmonary effort is normal.  ?   Breath sounds: Normal breath sounds. No wheezing or rales.  ?Abdominal:  ?   General: Bowel sounds are normal. There is no distension.  ?   Palpations: Abdomen is soft.  ?   Tenderness: There is no abdominal tenderness. There is no guarding or rebound.  ?Musculoskeletal:     ?    General: No tenderness. Normal range of motion.  ?   Comments: No edema.  No midline back tenderness but significant tenderness in the left paraspinal muscles down to the sciatic notch  ?Skin: ?   General: Skin is warm and dry.  ?   Findings: No rash.  ?Neurological:  ?   Mental Status: She is alert and oriented to person, place, and time.  ?   Cranial Nerves: No cranial nerve deficit.  ?   Motor: No weakness.  ?   Comments: Sensation is intact in the left lower extremity and patient has normal flexion extension of the hip and knee  ?Psychiatric:     ?   Behavior: Behavior normal.  ? ? ?ED Results / Procedures / Treatments   ?Labs ?(all labs ordered are listed, but only abnormal results are displayed) ?Labs Reviewed - No data to display ? ?EKG ?None ? ?Radiology ?No results found. ? ?Procedures ?Procedures  ? ? ?Medications Ordered in ED ?Medications  ?HYDROcodone-acetaminophen (NORCO/VICODIN) 5-325 MG per tablet 1 tablet (1 tablet Oral Given 11/13/21 0959)  ? ? ?ED Course/ Medical Decision Making/ A&P ?  ?                        ?Medical Decision Making ?Risk ?Prescription drug management. ? ? ?Pt with gradual onset of back pain suggestive of radiculopathy.  No neurovascular compromise and no incontinence.  Pt has no infectious sx, hx of CA  or other red flags concerning for pathologic back pain.  Pt is able to ambulate but is painful.  Normal strength and reflexes on exam.  Denies trauma since worse in the last 24 hours but did return to work.  Patient does not meet any criteria for MRI today as she has no neuro compromise.  However did take patient out of work for longer and encouraged her to follow-up with Dr. Mikeal Hawthorne as she may need physical therapy or further intervention.  Also discussed on gabapentin to see if that helps with her pain as she recently finished up steroid Dosepak and with her history of diabetes do not want to start steroids again.  She is also taking anti-inflammatories.  She was given a short  course of pain medicine but encouraged not to mix that with the gabapentin. ?Will give pt pain control and to return for developement of above sx. ? ?Patient is comfortable with this plan.  She does not meet criteria for admission or further imaging today. ? ? ? ? ? ? ? ? ?Final Clinical Impression(s) / ED Diagnoses ?Final diagnoses:  ?Sciatica of left  side  ? ? ?Rx / DC Orders ?ED Discharge Orders   ? ?      Ordered  ?  HYDROcodone-acetaminophen (NORCO/VICODIN) 5-325 MG tablet  Every 6 hours PRN       ? 11/13/21 0958  ?  gabapentin (NEURONTIN) 100 MG capsule  3 times daily with meals PRN       ? 11/13/21 [id number]  ? ?  ?  ? ?  ? ? ?  ?Gwyneth SproutPlunkett, [place]on, MD ?11/13/21 1133 ? ?

## 2021-11-13 NOTE — Discharge Instructions (Signed)
Make sure you are also using a heating pad or hot shower.  Continue to move around but avoid any lifting or prolonged standing. ?

## 2022-03-30 ENCOUNTER — Emergency Department (HOSPITAL_BASED_OUTPATIENT_CLINIC_OR_DEPARTMENT_OTHER)
Admission: EM | Admit: 2022-03-30 | Discharge: 2022-03-30 | Disposition: A | Discharge status: Home / Self Care | Payer: BC Managed Care – PPO | Attending: Emergency Medicine | Admitting: Emergency Medicine

## 2022-03-30 ENCOUNTER — Other Ambulatory Visit: Payer: Self-pay

## 2022-03-30 ENCOUNTER — Emergency Department (HOSPITAL_BASED_OUTPATIENT_CLINIC_OR_DEPARTMENT_OTHER): Payer: BC Managed Care – PPO

## 2022-03-30 ENCOUNTER — Encounter (HOSPITAL_BASED_OUTPATIENT_CLINIC_OR_DEPARTMENT_OTHER): Payer: Self-pay | Admitting: Radiology

## 2022-03-30 DIAGNOSIS — E119 Type 2 diabetes mellitus without complications: Secondary | ICD-10-CM | POA: Diagnosis not present

## 2022-03-30 DIAGNOSIS — R1032 Left lower quadrant pain: Secondary | ICD-10-CM | POA: Diagnosis present

## 2022-03-30 DIAGNOSIS — N132 Hydronephrosis with renal and ureteral calculous obstruction: Secondary | ICD-10-CM | POA: Diagnosis not present

## 2022-03-30 DIAGNOSIS — Z91040 Latex allergy status: Secondary | ICD-10-CM | POA: Insufficient documentation

## 2022-03-30 DIAGNOSIS — I10 Essential (primary) hypertension: Secondary | ICD-10-CM | POA: Diagnosis not present

## 2022-03-30 DIAGNOSIS — Z79899 Other long term (current) drug therapy: Secondary | ICD-10-CM | POA: Insufficient documentation

## 2022-03-30 DIAGNOSIS — N200 Calculus of kidney: Secondary | ICD-10-CM

## 2022-03-30 DIAGNOSIS — Z7982 Long term (current) use of aspirin: Secondary | ICD-10-CM | POA: Diagnosis not present

## 2022-03-30 DIAGNOSIS — Z7984 Long term (current) use of oral hypoglycemic drugs: Secondary | ICD-10-CM | POA: Diagnosis not present

## 2022-03-30 LAB — URINALYSIS, ROUTINE W REFLEX MICROSCOPIC
Bilirubin Urine: NEGATIVE
Glucose, UA: 250 mg/dL — AB
Ketones, ur: NEGATIVE mg/dL
Nitrite: NEGATIVE
Protein, ur: 30 mg/dL — AB
RBC / HPF: >50 RBC/hpf — ABNORMAL HIGH (ref 0–5)
Specific Gravity, Urine: 1.021 (ref 1.005–1.030)
pH: 6 (ref 5.0–8.0)

## 2022-03-30 LAB — CBC
HCT: 37.1 % (ref 36.0–46.0)
Hemoglobin: 12.2 g/dL (ref 12.0–15.0)
MCH: 31.9 pg (ref 26.0–34.0)
MCHC: 32.9 g/dL (ref 30.0–36.0)
MCV: 96.9 fL (ref 80.0–100.0)
Platelets: 310 10*3/uL (ref 150–400)
RBC: 3.83 MIL/uL — ABNORMAL LOW (ref 3.87–5.11)
RDW: 13 % (ref 11.5–15.5)
WBC: 6 10*3/uL (ref 4.0–10.5)
nRBC: 0 % (ref 0.0–0.2)

## 2022-03-30 LAB — COMPREHENSIVE METABOLIC PANEL
ALT: 10 U/L (ref 0–44)
AST: 11 U/L — ABNORMAL LOW (ref 15–41)
Albumin: 4 g/dL (ref 3.5–5.0)
Alkaline Phosphatase: 69 U/L (ref 38–126)
Anion gap: 7 (ref 5–15)
BUN: 15 mg/dL (ref 6–20)
CO2: 28 mmol/L (ref 22–32)
Calcium: 9.1 mg/dL (ref 8.9–10.3)
Chloride: 102 mmol/L (ref 98–111)
Creatinine, Ser: 0.78 mg/dL (ref 0.44–1.00)
GFR, Estimated: >60 mL/min (ref >60)
Glucose, Bld: 204 mg/dL — ABNORMAL HIGH (ref 70–99)
Potassium: 3.3 mmol/L — ABNORMAL LOW (ref 3.5–5.1)
Sodium: 137 mmol/L (ref 135–145)
Total Bilirubin: 0.5 mg/dL (ref 0.3–1.2)
Total Protein: 7.4 g/dL (ref 6.5–8.1)

## 2022-03-30 LAB — LIPASE, BLOOD: Lipase: 28 U/L (ref 11–51)

## 2022-03-30 LAB — PREGNANCY, URINE: Preg Test, Ur: NEGATIVE

## 2022-03-30 MED ORDER — OXYCODONE HCL 5 MG PO TABS: 5.0000 mg | ORAL_TABLET | Freq: Four times a day (QID) | ORAL | 0 refills | Status: DC | PRN

## 2022-03-30 MED ORDER — FENTANYL CITRATE PF 50 MCG/ML IJ SOSY
50.0000 ug | PREFILLED_SYRINGE | Freq: Once | INTRAMUSCULAR | Status: AC
  Administered 2022-03-30: 50 ug via INTRAMUSCULAR
  Filled 2022-03-30: qty 1

## 2022-03-30 MED ORDER — FENTANYL CITRATE PF 50 MCG/ML IJ SOSY: 50.0000 ug | PREFILLED_SYRINGE | Freq: Once | INTRAMUSCULAR | Status: DC

## 2022-03-30 MED ORDER — METFORMIN HCL 500 MG PO TABS: 500.0000 mg | ORAL_TABLET | Freq: Two times a day (BID) | ORAL | 1 refills | Status: DC

## 2022-03-30 NOTE — ED Triage Notes (Addendum)
Patient arrives with complaints of worsening back pain and abdominal pain.  Patient states that she has degenerative disc disease, but the pain has increased in the past 3 days (more than her norm). Patient also reports left sided abdominal pain x1 week.   Rates pain a 11/10.  Patient states that she is out of her bp meds and her metformin (ran out 1 week ago).

## 2022-03-30 NOTE — Discharge Instructions (Signed)
Follow-up with urology.  I have prescribed you Roxicodone which is a narcotic pain medicine for any breakthrough pain.  Do not drive or do any dangerous activities while taking this medicine.  I have also sent in a refill of your metformin.

## 2022-03-30 NOTE — ED Provider Notes (Signed)
[facility] EMERGENCY DEPT Provider Note   CSN: [id number] Arrival date & time: 03/30/22  1438     History  Chief Complaint  Patient presents with   Back Pain   Abdominal Pain    Suzanne [name] is a 46 y.o. female.  Patient here with left flank pain for the last several days.  History of kidney stones, hypertension, diabetes, sciatica.  Patient with pain in the left flank that wraps around to her left groin.  Denies any pain with urination, denies any fever chills or hematuria.  Movement makes it worse.  Rest makes it better.  Has been using some over-the-counter medications with relief.  Denies any fevers or chills or chest pain or shortness of breath or weakness or numbness.  No problems going to the bathroom.  No weakness or numbness in her lower legs.  She is not sure if this is her sciatic pain or kidney stone.  The history is provided by the patient.       Home Medications Prior to Admission medications   Medication Sig Start Date End Date Taking? Authorizing Provider  oxyCODONE (ROXICODONE) 5 MG immediate release tablet Take 1 tablet (5 mg total) by mouth every 6 (six) hours as needed for up to 20 doses for breakthrough pain. 03/30/22  Yes Khylon Davies, DO  amLODipine (NORVASC) 10 MG tablet Take 1 tablet (10 mg total) by mouth daily. 08/18/17   Marny Lowenstein, PA-C  aspirin 81 MG chewable tablet Chew by mouth.    [provider]  gabapentin (NEURONTIN) 100 MG capsule Take 1 capsule (100 mg total) by mouth 3 (three) times daily with meals as needed (for nerve pain). 11/13/21   Gwyneth Sprout, MD  hydrochlorothiazide (HYDRODIURIL) 25 MG tablet Take 25 mg by mouth daily.    [provider]  HYDROcodone-acetaminophen (NORCO/VICODIN) 5-325 MG tablet Take 1 tablet by mouth every 6 (six) hours as needed. 11/13/21   Gwyneth Sprout, MD  ibuprofen (ADVIL,MOTRIN) 800 MG tablet Take 800 mg by mouth 3 (three) times daily. 04/25/18   [provider]  metFORMIN (GLUCOPHAGE) 500 MG tablet Take 1 tablet (500 mg total) by mouth 2 (two) times daily with a meal. 03/30/22 05/29/22  Hallis Meditz, DO  omeprazole (PRILOSEC) 20 MG capsule Take 20 mg by mouth daily.    [provider]  Prenat w/o A Vit-FeFum-FePo-FA (CONCEPT OB) 130-92.4-1 MG CAPS Take 1 capsule by mouth daily. Patient not taking: Reported on 05/14/2020 01/09/16   Reva Bores, MD      Allergies    Coconut flavor, Latex, Pineapple, and Tomato    Review of Systems   Review of Systems  Physical Exam Updated Vital Signs BP (!) 161/79 (BP Location: Right Arm)   Pulse 74   Temp 98.3 F (36.8 C) (Oral)   Resp 12   Ht 5\' 6"  (1.676 m)   Wt 83.5 kg   SpO2 100%   BMI 29.70 kg/m  Physical Exam Vitals and nursing note reviewed.  Constitutional:      General: She is not in acute distress.    Appearance: She is well-developed.  HENT:     Head: Normocephalic and atraumatic.     Mouth/Throat:     Mouth: Mucous membranes are moist.  Eyes:     Extraocular Movements: Extraocular movements intact.     Conjunctiva/sclera: Conjunctivae normal.     Pupils: Pupils are equal, round, and reactive to light.  Cardiovascular:     Rate and  Rhythm: Normal rate and regular rhythm.     Heart sounds: Normal heart sounds. No murmur heard. Pulmonary:     Effort: Pulmonary effort is normal. No respiratory distress.     Breath sounds: Normal breath sounds.  Abdominal:     Palpations: Abdomen is soft.     Tenderness: There is no abdominal tenderness. There is left CVA tenderness.  Musculoskeletal:        General: No swelling.     Cervical back: Neck supple.  Skin:    General: Skin is warm and dry.     Capillary Refill: Capillary refill takes less than 2 seconds.  Neurological:     General: No focal deficit present.     Mental Status: She is alert.     Cranial Nerves: No cranial nerve deficit.     Motor: No weakness.     Comments: 5+ out of 5 strength throughout, normal  sensation, no drift, normal finger-nose-finger, normal speech  Psychiatric:        Mood and Affect: Mood normal.     ED Results / Procedures / Treatments   Labs (all labs ordered are listed, but only abnormal results are displayed) Labs Reviewed  COMPREHENSIVE METABOLIC PANEL - Abnormal; Notable for the following components:      Result Value   Potassium 3.3 (*)    Glucose, Bld 204 (*)    AST 11 (*)    All other components within normal limits  CBC - Abnormal; Notable for the following components:   RBC 3.83 (*)    All other components within normal limits  URINALYSIS, ROUTINE W REFLEX MICROSCOPIC - Abnormal; Notable for the following components:   APPearance HAZY (*)    Glucose, UA 250 (*)    Hgb urine dipstick MODERATE (*)    Protein, ur 30 (*)    Leukocytes,Ua LARGE (*)    RBC / HPF >50 (*)    All other components within normal limits  URINE CULTURE  LIPASE, BLOOD  PREGNANCY, URINE    EKG None  Radiology CT Renal Stone Study  Result Date: 03/30/2022 CLINICAL DATA:  Worsening left flank pain for 1 week. Nephrolithiasis. EXAM: CT ABDOMEN AND PELVIS WITHOUT CONTRAST TECHNIQUE: Multidetector CT imaging of the abdomen and pelvis was performed following the standard protocol without IV contrast. RADIATION DOSE REDUCTION: This exam was performed according to the departmental dose-optimization program which includes automated exposure control, adjustment of the mA and/or kV according to patient size and/or use of iterative reconstruction technique. COMPARISON:  06/23/2021 FINDINGS: Lower chest: No acute findings. Hepatobiliary:  No mass visualized on this unenhanced exam. Pancreas: No mass or inflammatory process visualized on this unenhanced exam. Spleen:  Within normal limits in size. Adrenals/Urinary tract: 11 mm calculus is again seen at the left UPJ causing mild left hydronephrosis. No other urinary tract calculi identified. Stomach/Bowel: No evidence of obstruction, inflammatory  process, or abnormal fluid collections. Normal appendix visualized. Vascular/Lymphatic: No pathologically enlarged lymph nodes identified. No evidence of abdominal aortic aneurysm. Aortic atherosclerotic calcification incidentally noted. Reproductive:  No mass or other significant abnormality. Other:  None. Musculoskeletal:  No suspicious bone lesions identified. IMPRESSION: 11 mm calculus at the left UPJ causing mild left hydronephrosis, without significant change since prior study. Electronically Signed   By: Danae Orleans M.D.   On: 03/30/2022 16:19    Procedures Procedures    Medications Ordered in ED Medications  fentaNYL (SUBLIMAZE) injection 50 mcg (50 mcg Intramuscular Given 03/30/22 1612)  ED Course/ Medical Decision Making/ A&P                           Medical Decision Making Amount and/or Complexity of Data Reviewed Labs: ordered. Radiology: ordered.  Risk Prescription drug management.   Suzanne [name] is a 46 year old female history of hypertension, diabetes, sciatica who presents to the ED with left flank pain.  Differential diagnosis is kidney stone versus urinary tract infection versus muscle spasm versus sciatic symptoms.  We will get CBC, CMP, lipase, urinalysis, CT scan abdomen pelvis.  We will give a dose IM fentanyl.  Per my review and interpretation of labs there is no significant leukocytosis or anemia or electrolyte abnormality.  Blood sugar mildly elevated but she is not in DKA.  Urinalysis with some RBCs, leukocytes but epithelial cells.  Appears to be likely contamination.  We will send urine culture.  She is not having pain with urination.  Awaiting CT scan abdomen pelvis.  CT scan with unchanged kidney stone at the left UPJ causing mild left hydronephrosis.  However there is no significant change from prior study.  Prior study was from several months ago.  She has no fever.  No signs of urinary tract infection.  She is very comfortable on exam.  Overall we will  write her for oxycodone for breakthrough pain but she does need to follow-up with urology as this does need to be likely treated.  She understands return precautions including fever, worsening pain.  I have sent in refill for metformin as well.  This chart was dictated using voice recognition software.  Despite best efforts to proofread,  errors can occur which can change the documentation meaning.         Final Clinical Impression(s) / ED Diagnoses Final diagnoses:  Kidney stone    Rx / DC Orders ED Discharge Orders          Ordered    metFORMIN (GLUCOPHAGE) 500 MG tablet  2 times daily with meals        03/30/22 1627    oxyCODONE (ROXICODONE) 5 MG immediate release tablet  Every 6 hours PRN        03/30/22 1627              Virgina Norfolk, DO 03/30/22 1628

## 2022-04-01 LAB — URINE CULTURE

## 2022-05-19 ENCOUNTER — Other Ambulatory Visit: Payer: Self-pay

## 2022-05-19 ENCOUNTER — Emergency Department (HOSPITAL_BASED_OUTPATIENT_CLINIC_OR_DEPARTMENT_OTHER)
Admission: EM | Admit: 2022-05-19 | Discharge: 2022-05-19 | Disposition: A | Discharge status: Home / Self Care | Payer: BC Managed Care – PPO | Attending: Emergency Medicine | Admitting: Emergency Medicine

## 2022-05-19 ENCOUNTER — Encounter (HOSPITAL_BASED_OUTPATIENT_CLINIC_OR_DEPARTMENT_OTHER): Payer: Self-pay

## 2022-05-19 ENCOUNTER — Emergency Department (HOSPITAL_BASED_OUTPATIENT_CLINIC_OR_DEPARTMENT_OTHER): Payer: BC Managed Care – PPO

## 2022-05-19 DIAGNOSIS — Z7982 Long term (current) use of aspirin: Secondary | ICD-10-CM | POA: Diagnosis not present

## 2022-05-19 DIAGNOSIS — Z91040 Latex allergy status: Secondary | ICD-10-CM | POA: Insufficient documentation

## 2022-05-19 DIAGNOSIS — R109 Unspecified abdominal pain: Secondary | ICD-10-CM

## 2022-05-19 DIAGNOSIS — D72829 Elevated white blood cell count, unspecified: Secondary | ICD-10-CM | POA: Insufficient documentation

## 2022-05-19 DIAGNOSIS — N200 Calculus of kidney: Secondary | ICD-10-CM | POA: Diagnosis not present

## 2022-05-19 DIAGNOSIS — Z79899 Other long term (current) drug therapy: Secondary | ICD-10-CM | POA: Diagnosis not present

## 2022-05-19 LAB — URINALYSIS, ROUTINE W REFLEX MICROSCOPIC
Bilirubin Urine: NEGATIVE
Glucose, UA: NEGATIVE mg/dL
Nitrite: NEGATIVE
Specific Gravity, Urine: 1.015 (ref 1.005–1.030)
pH: 6.5 (ref 5.0–8.0)

## 2022-05-19 LAB — CBC
HCT: 41.6 % (ref 36.0–46.0)
Hemoglobin: 13.9 g/dL (ref 12.0–15.0)
MCH: 32.3 pg (ref 26.0–34.0)
MCHC: 33.4 g/dL (ref 30.0–36.0)
MCV: 96.5 fL (ref 80.0–100.0)
Platelets: 305 10*3/uL (ref 150–400)
RBC: 4.31 MIL/uL (ref 3.87–5.11)
RDW: 12.6 % (ref 11.5–15.5)
WBC: 6.6 10*3/uL (ref 4.0–10.5)
nRBC: 0 % (ref 0.0–0.2)

## 2022-05-19 LAB — HCG, SERUM, QUALITATIVE: Preg, Serum: NEGATIVE

## 2022-05-19 LAB — BASIC METABOLIC PANEL
Anion gap: 9 (ref 5–15)
BUN: 10 mg/dL (ref 6–20)
CO2: 30 mmol/L (ref 22–32)
Calcium: 10.1 mg/dL (ref 8.9–10.3)
Chloride: 100 mmol/L (ref 98–111)
Creatinine, Ser: 0.8 mg/dL (ref 0.44–1.00)
GFR, Estimated: >60 mL/min (ref >60)
Glucose, Bld: 127 mg/dL — ABNORMAL HIGH (ref 70–99)
Potassium: 3.8 mmol/L (ref 3.5–5.1)
Sodium: 139 mmol/L (ref 135–145)

## 2022-05-19 MED ORDER — SODIUM CHLORIDE 0.9 % IV BOLUS: 1000.0000 mL | Freq: Once | INTRAVENOUS | Status: DC

## 2022-05-19 MED ORDER — NAPROXEN 500 MG PO TABS: 500.0000 mg | ORAL_TABLET | Freq: Two times a day (BID) | ORAL | 0 refills | Status: DC

## 2022-05-19 MED ORDER — OXYCODONE HCL 5 MG PO TABS: 5.0000 mg | ORAL_TABLET | Freq: Four times a day (QID) | ORAL | 0 refills | Status: DC | PRN

## 2022-05-19 MED ORDER — KETOROLAC TROMETHAMINE 15 MG/ML IJ SOLN
15.0000 mg | Freq: Once | INTRAMUSCULAR | Status: AC
  Administered 2022-05-19: 15 mg via INTRAVENOUS
  Filled 2022-05-19: qty 1

## 2022-05-19 MED ORDER — ONDANSETRON HCL 4 MG PO TABS: 4.0000 mg | ORAL_TABLET | Freq: Three times a day (TID) | ORAL | 0 refills | Status: DC | PRN

## 2022-05-19 NOTE — ED Triage Notes (Signed)
Patient here POV from Home.  Endorses Renal Stone located to Left Side since April. 0.8 mm in Size then. For approximately 1 Week she has been having Left Sided Flank Pain. Associated with Frequency.   Possible Pregnancy.   NAD Noted during Triage. A&Ox4. GCS 15. Ambulatory.

## 2022-05-19 NOTE — ED Notes (Signed)
Patient transported to CT 

## 2022-05-19 NOTE — ED Provider Notes (Signed)
[facility] EMERGENCY DEPT Provider Note   CSN: [id number] Arrival date & time: 05/19/22  1500     History  Chief Complaint  Patient presents with   Flank Pain    Suzanne [name] is a 46 y.o. female with a past medical history of kidney stones who presents to the emergency department concerns for left flank pain onset 1 week.  Notes her flank pain has been constant.  She has taken oxycodone at home without relief of her symptoms.  Patient was not given information for urologist on her previous visit to the emergency department.  Denies urinary symptoms, abdominal pain, nausea, vomiting, fever.   The history is provided by the patient. No language interpreter was used.       Home Medications Prior to Admission medications   Medication Sig Start Date End Date Taking? Authorizing Provider  naproxen (NAPROSYN) 500 MG tablet Take 1 tablet (500 mg total) by mouth 2 (two) times daily. 05/19/22  Yes Veneta Sliter A, PA-C  ondansetron (ZOFRAN) 4 MG tablet Take 1 tablet (4 mg total) by mouth every 8 (eight) hours as needed for nausea or vomiting. 05/19/22  Yes Artavis Cowie A, PA-C  amLODipine (NORVASC) 10 MG tablet Take 1 tablet (10 mg total) by mouth daily. 08/18/17   Marny Lowenstein, PA-C  aspirin 81 MG chewable tablet Chew by mouth.    [provider]  gabapentin (NEURONTIN) 100 MG capsule Take 1 capsule (100 mg total) by mouth 3 (three) times daily with meals as needed (for nerve pain). 11/13/21   Gwyneth Sprout, MD  hydrochlorothiazide (HYDRODIURIL) 25 MG tablet Take 25 mg by mouth daily.    [provider]  HYDROcodone-acetaminophen (NORCO/VICODIN) 5-325 MG tablet Take 1 tablet by mouth every 6 (six) hours as needed. 11/13/21   Gwyneth Sprout, MD  ibuprofen (ADVIL,MOTRIN) 800 MG tablet Take 800 mg by mouth 3 (three) times daily. 04/25/18   [provider]  metFORMIN (GLUCOPHAGE) 500 MG tablet Take 1 tablet (500 mg total) by mouth 2 (two)  times daily with a meal. 03/30/22 05/29/22  Curatolo, Adam, DO  omeprazole (PRILOSEC) 20 MG capsule Take 20 mg by mouth daily.    [provider]  oxyCODONE (ROXICODONE) 5 MG immediate release tablet Take 1 tablet (5 mg total) by mouth every 6 (six) hours as needed for up to 20 doses for breakthrough pain. 05/19/22   Kirsten Mckone A, PA-C  Prenat w/o A Vit-FeFum-FePo-FA (CONCEPT OB) 130-92.4-1 MG CAPS Take 1 capsule by mouth daily. Patient not taking: Reported on 05/14/2020 01/09/16   Reva Bores, MD      Allergies    Coconut flavor, Latex, Pineapple, and Tomato    Review of Systems   Review of Systems  Constitutional:  Negative for fever.  Gastrointestinal:  Negative for abdominal pain, nausea and vomiting.  Genitourinary:  Positive for flank pain. Negative for dysuria and hematuria.  All other systems reviewed and are negative.   Physical Exam Updated Vital Signs BP (!) 158/105 (BP Location: Right Arm)   Pulse 94   Temp 98.7 F (37.1 C)   Resp 18   Ht 5\' 6"  (1.676 m)   Wt 83.5 kg   SpO2 100%   BMI 29.71 kg/m  Physical Exam Vitals and nursing note reviewed.  Constitutional:      General: She is not in acute distress.    Appearance: She is not diaphoretic.  HENT:     Head: Normocephalic and atraumatic.  Mouth/Throat:     Pharynx: No oropharyngeal exudate.  Eyes:     General: No scleral icterus.    Conjunctiva/sclera: Conjunctivae normal.  Cardiovascular:     Rate and Rhythm: Normal rate and regular rhythm.     Pulses: Normal pulses.     Heart sounds: Normal heart sounds.  Pulmonary:     Effort: Pulmonary effort is normal. No respiratory distress.     Breath sounds: Normal breath sounds. No wheezing.  Abdominal:     General: Bowel sounds are normal.     Palpations: Abdomen is soft. There is no mass.     Tenderness: There is no abdominal tenderness. There is no right CVA tenderness, left CVA tenderness, guarding or rebound.  Musculoskeletal:         General: Normal range of motion.     Cervical back: Normal range of motion and neck supple.  Skin:    General: Skin is warm and dry.  Neurological:     Mental Status: She is alert.  Psychiatric:        Behavior: Behavior normal.     ED Results / Procedures / Treatments   Labs (all labs ordered are listed, but only abnormal results are displayed) Labs Reviewed  BASIC METABOLIC PANEL - Abnormal; Notable for the following components:      Result Value   Glucose, Bld 127 (*)    All other components within normal limits  URINALYSIS, ROUTINE W REFLEX MICROSCOPIC - Abnormal; Notable for the following components:   Hgb urine dipstick MODERATE (*)    Ketones, ur TRACE (*)    Protein, ur TRACE (*)    Leukocytes,Ua MODERATE (*)    All other components within normal limits  CBC  HCG, SERUM, QUALITATIVE    EKG None  Radiology CT Renal Stone Study  Result Date: 05/19/2022 CLINICAL DATA:  Left flank pain x1 week. EXAM: CT ABDOMEN AND PELVIS WITHOUT CONTRAST TECHNIQUE: Multidetector CT imaging of the abdomen and pelvis was performed following the standard protocol without IV contrast. RADIATION DOSE REDUCTION: This exam was performed according to the departmental dose-optimization program which includes automated exposure control, adjustment of the mA and/or kV according to patient size and/or use of iterative reconstruction technique. COMPARISON:  March 30, 2022 and May 19, 2009 FINDINGS: Lower chest: No acute abnormality. Hepatobiliary: No focal liver abnormality is seen. No gallstones, gallbladder wall thickening, or biliary dilatation. Pancreas: Unremarkable. No pancreatic ductal dilatation or surrounding inflammatory changes. Spleen: The spleen is normal in size. 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 Adrenals/Urinary Tract: Adrenal glands are unremarkable.  Kidneys are normal in size, without focal lesions. A 12 mm obstructing renal calculus is seen at the left UPJ. This is unchanged in size and location compared to the prior study. Mild left-sided hydronephrosis and left peripelvic inflammatory fat stranding is seen. The urinary bladder is contracted and subsequently limited in evaluation. Stomach/Bowel: Stomach is within normal limits. Appendix appears normal. No evidence of bowel wall thickening, distention, or inflammatory changes. Vascular/Lymphatic: Aortic atherosclerosis. A stable 17 mm right inguinal lymph node is seen. Reproductive: The uterus and bilateral adnexa are unremarkable. Other: No abdominal wall hernia or abnormality. No abdominopelvic ascites. Musculoskeletal: No acute or significant osseous findings. IMPRESSION: 1. 12 mm obstructing renal calculus at the left UPJ. 2. Aortic atherosclerosis. Aortic Atherosclerosis (ICD10-I70.0). Electronically Signed   By: Demetrius Revel.D.  On: 05/19/2022 18:06    Procedures Procedures    Medications Ordered in ED Medications  ketorolac (TORADOL) 15 MG/ML injection 15 mg (15 mg Intravenous Given 05/19/22 1746)    ED Course/ Medical Decision Making/ A&P Clinical Course as of 05/19/22 1833  Tue May 19, 2022  1816 Re-evaluated and noted improvement of symptoms with treatment regimen in the ED.  [SB]  1821 Consult with urologist, Dr. Alvester Morin who recommends follow up in the office [SB]  1823 Discussed with patient discharge treatment plan. Stressed importance of urology follow up. Answered all available questions. Pt appears safe for discharge at this time.  [SB]    Clinical Course User Index [SB] Rheya Minogue A, PA-C                           Medical Decision Making Amount and/or Complexity of Data Reviewed Labs: ordered. Radiology: ordered.  Risk Prescription drug management.   Pt presents with left flank pain onset 1 week. Denies no nausea, vomiting, fever, abdominal pain.  Has  tried oxycodone for her symptoms.  Has a history of kidney stones.  Has not followed up with the urologist.  Vital signs stable, patient afebrile, not tachycardic or hypoxic.  On exam patient with no abdominal tenderness to palpation.  No CVA tenderness to palpation.  Otherwise no acute cardiovascular, respiratory, abdominal exam findings.  Differential diagnosis includes acute cystitis, pyelonephritis, nephrolithiasis.    Additional history obtained:  External records from outside source obtained and reviewed including: Patient was evaluated in the emergency department on 03/30/2022 for similar concerns.  At that time was found to have 11 mm stone at the left UVJ.  Was given information for urology and instructed to follow-up.  Also given a prescription for oxycodone.  Labs:  I ordered, and personally interpreted labs.  The pertinent results include: Urinalysis with moderate hemoglobin, moderate leukocytes otherwise nitrate negative. BNP was slightly elevated glucose of 127 otherwise unremarkable. Negative serum pregnancy.  CBC unremarkable  Imaging: I ordered imaging studies including CT renal stone study I independently visualized and interpreted imaging which showed:  1. 12 mm obstructing renal calculus at the left UPJ.  2. Aortic atherosclerosis.   I agree with the radiologist interpretation  Medications:  I ordered medication including toradol for pain management Reevaluation of the patient after these medicines and interventions, I reevaluated the patient and found that they have improved I have reviewed the patients home medicines and have made adjustments as needed   Consultations: I requested consultation with the Urologist, Dr. Alvester Morin and discussed lab and imaging findings as well as pertinent plan - they recommend: close follow up in the office.    Disposition: Presentation suspicious for nephrolithiasis. Doubt acute cystitis at this time. After consideration of the diagnostic  results and the patients response to treatment, I feel that the patient would benefit from Discharge home. PDMP reviewed, patient sent with a short course of oxycodone. Also sent with a Rx of naprosyn and zofran. Given information for Urologist and informed to follow up. Supportive care measures and strict return precautions discussed with patient at bedside. Pt acknowledges and verbalizes understanding. Pt appears safe for discharge. Follow up as indicated in discharge paperwork.   This chart was dictated using voice recognition software, Dragon. Despite the best efforts of this provider to proofread and correct errors, errors may still occur which can change documentation meaning.   Final Clinical Impression(s) / ED Diagnoses Final diagnoses:  Left flank pain  Nephrolithiasis    Rx / DC Orders ED Discharge Orders          Ordered    naproxen (NAPROSYN) 500 MG tablet  2 times daily        05/19/22 1829    ondansetron (ZOFRAN) 4 MG tablet  Every 8 hours PRN        05/19/22 1829    oxyCODONE (ROXICODONE) 5 MG immediate release tablet  Every 6 hours PRN        05/19/22 1832              Espen Bethel A, PA-C 05/19/22 1834    Margette Fast, MD 05/20/22 1132

## 2022-05-19 NOTE — Discharge Instructions (Addendum)
It was a pleasure taking care of you today!  Your labs are overall unremarkable.  Urine showed concern for blood in the urine otherwise no concerns time for UTI.  Your CT scan showed a stone to the left kidney.  We sent a prescription for Naprosyn, take as prescribed.  You may continue with the prescription of the oxycodone as needed for breakthrough pain.  Attached is information for the urologist it is important that you call tomorrow to set up a follow-up appointment regarding today's ED visit.  You will let them know that you were seen in the emergency department and the urologist that was consulted recommended that you were evaluated in the office.  Ensure to maintain fluid intake.  Return to the emergency department for increasing/worsening symptoms.

## 2022-05-19 NOTE — ED Notes (Signed)
Reviewed AVS/discharge instruction with patient. Time allotted for and all questions answered. Patient is agreeable for d/c and escorted to ed exit by staff.  

## 2022-06-23 ENCOUNTER — Telehealth: Payer: Self-pay | Admitting: Family Medicine

## 2022-06-23 NOTE — Telephone Encounter (Signed)
Called patient to schedule appointment for January. There was no answer to the phone call so a voicemail was left with the call back number for the office. (Annual with depo)

## 2022-06-26 ENCOUNTER — Encounter (HOSPITAL_BASED_OUTPATIENT_CLINIC_OR_DEPARTMENT_OTHER): Payer: Self-pay | Admitting: Emergency Medicine

## 2022-06-26 ENCOUNTER — Other Ambulatory Visit: Payer: Self-pay

## 2022-06-26 ENCOUNTER — Emergency Department (HOSPITAL_BASED_OUTPATIENT_CLINIC_OR_DEPARTMENT_OTHER)
Admission: EM | Admit: 2022-06-26 | Discharge: 2022-06-26 | Disposition: A | Discharge status: Home / Self Care | Payer: BC Managed Care – PPO | Attending: Emergency Medicine | Admitting: Emergency Medicine

## 2022-06-26 DIAGNOSIS — R000 Tachycardia, unspecified: Secondary | ICD-10-CM | POA: Insufficient documentation

## 2022-06-26 DIAGNOSIS — Z91040 Latex allergy status: Secondary | ICD-10-CM | POA: Insufficient documentation

## 2022-06-26 DIAGNOSIS — Z7982 Long term (current) use of aspirin: Secondary | ICD-10-CM | POA: Insufficient documentation

## 2022-06-26 DIAGNOSIS — Z7984 Long term (current) use of oral hypoglycemic drugs: Secondary | ICD-10-CM | POA: Insufficient documentation

## 2022-06-26 DIAGNOSIS — E876 Hypokalemia: Secondary | ICD-10-CM | POA: Diagnosis present

## 2022-06-26 LAB — BASIC METABOLIC PANEL
Anion gap: 11 (ref 5–15)
BUN: 17 mg/dL (ref 6–20)
CO2: 28 mmol/L (ref 22–32)
Calcium: 10.1 mg/dL (ref 8.9–10.3)
Chloride: 101 mmol/L (ref 98–111)
Creatinine, Ser: 1.06 mg/dL — ABNORMAL HIGH (ref 0.44–1.00)
GFR, Estimated: >60 mL/min (ref >60)
Glucose, Bld: 180 mg/dL — ABNORMAL HIGH (ref 70–99)
Potassium: 2.7 mmol/L — CL (ref 3.5–5.1)
Sodium: 140 mmol/L (ref 135–145)

## 2022-06-26 LAB — MAGNESIUM: Magnesium: 2.1 mg/dL (ref 1.7–2.4)

## 2022-06-26 MED ORDER — POTASSIUM CHLORIDE CRYS ER 20 MEQ PO TBCR
40.0000 meq | EXTENDED_RELEASE_TABLET | Freq: Once | ORAL | Status: AC
  Administered 2022-06-26: 40 meq via ORAL
  Filled 2022-06-26: qty 2

## 2022-06-26 MED ORDER — HYDRALAZINE HCL 10 MG PO TABS: 10.0000 mg | ORAL_TABLET | Freq: Two times a day (BID) | ORAL | 0 refills | Status: AC

## 2022-06-26 MED ORDER — POTASSIUM CHLORIDE CRYS ER 20 MEQ PO TBCR: 20.0000 meq | EXTENDED_RELEASE_TABLET | Freq: Two times a day (BID) | ORAL | 0 refills | Status: DC

## 2022-06-26 NOTE — ED Triage Notes (Signed)
Patient was notified of critical low k by PCP. Labs done on Tuesday 07/23/22. Denies any symptoms

## 2022-06-26 NOTE — ED Provider Notes (Signed)
[facility] EMERGENCY DEPT Provider Note   CSN: [id number] Arrival date & time: 06/26/22  1455     History  CC: Low potassium   Suzanne [name] is a 46 y.o. female here for low potassium. She was contacted by her primary care clinic and told her potassium is low.  Per my review of external records her potassium was 2.82 days ago when it was checked as an outpatient.  She is asymptomatic otherwise.  She denies problems with low potassium in the past.  She is on hydrochlorothiazide and has been for a while.  HPI     Home Medications Prior to Admission medications   Medication Sig Start Date End Date Taking? Authorizing Provider  hydrALAZINE (APRESOLINE) 10 MG tablet Take 1 tablet (10 mg total) by mouth in the morning and at bedtime. 06/26/22 07/26/22 Yes Jermani Pund, Kermit Balo, MD  potassium chloride SA (KLOR-CON M) 20 MEQ tablet Take 1 tablet (20 mEq total) by mouth 2 (two) times daily for 10 days. 06/26/22 07/06/22 Yes Clayton Bosserman, Kermit Balo, MD  amLODipine (NORVASC) 10 MG tablet Take 1 tablet (10 mg total) by mouth daily. 08/18/17   Marny Lowenstein, PA-C  aspirin 81 MG chewable tablet Chew by mouth.    [provider]  gabapentin (NEURONTIN) 100 MG capsule Take 1 capsule (100 mg total) by mouth 3 (three) times daily with meals as needed (for nerve pain). 11/13/21   Gwyneth Sprout, MD  HYDROcodone-acetaminophen (NORCO/VICODIN) 5-325 MG tablet Take 1 tablet by mouth every 6 (six) hours as needed. 11/13/21   Gwyneth Sprout, MD  ibuprofen (ADVIL,MOTRIN) 800 MG tablet Take 800 mg by mouth 3 (three) times daily. 04/25/18   [provider]  metFORMIN (GLUCOPHAGE) 500 MG tablet Take 1 tablet (500 mg total) by mouth 2 (two) times daily with a meal. 03/30/22 05/29/22  Curatolo, Adam, DO  naproxen (NAPROSYN) 500 MG tablet Take 1 tablet (500 mg total) by mouth 2 (two) times daily. 05/19/22   Blue, Soijett A, PA-C  omeprazole (PRILOSEC) 20 MG capsule Take 20 mg by mouth daily.     [provider]  ondansetron (ZOFRAN) 4 MG tablet Take 1 tablet (4 mg total) by mouth every 8 (eight) hours as needed for nausea or vomiting. 05/19/22   Blue, Soijett A, PA-C  oxyCODONE (ROXICODONE) 5 MG immediate release tablet Take 1 tablet (5 mg total) by mouth every 6 (six) hours as needed for up to 20 doses for breakthrough pain. 05/19/22   Blue, Soijett A, PA-C  Prenat w/o A Vit-FeFum-FePo-FA (CONCEPT OB) 130-92.4-1 MG CAPS Take 1 capsule by mouth daily. Patient not taking: Reported on 05/14/2020 01/09/16   Reva Bores, MD      Allergies    Coconut flavor, Latex, Pineapple, and Tomato    Review of Systems   Review of Systems  Physical Exam Updated Vital Signs BP (!) 132/93   Pulse 89   Temp 99.1 F (37.3 C) (Oral)   Resp 20   SpO2 99%  Physical Exam Constitutional:      General: She is not in acute distress. HENT:     Head: Normocephalic and atraumatic.  Eyes:     Conjunctiva/sclera: Conjunctivae normal.     Pupils: Pupils are equal, round, and reactive to light.  Cardiovascular:     Rate and Rhythm: Normal rate and regular rhythm.  Pulmonary:     Effort: Pulmonary effort is normal. No respiratory distress.  Skin:    General: Skin is warm and  dry.  Neurological:     General: No focal deficit present.     Mental Status: She is alert. Mental status is at baseline.  Psychiatric:        Mood and Affect: Mood normal.        Behavior: Behavior normal.     ED Results / Procedures / Treatments   Labs (all labs ordered are listed, but only abnormal results are displayed) Labs Reviewed  BASIC METABOLIC PANEL - Abnormal; Notable for the following components:      Result Value   Potassium 2.7 (*)    Glucose, Bld 180 (*)    Creatinine, Ser 1.06 (*)    All other components within normal limits  MAGNESIUM    EKG EKG Interpretation  Date/Time:  Friday June 26 2022 15:24:31 EST Ventricular Rate:  114 PR Interval:  128 QRS Duration: 76 QT  Interval:  350 QTC Calculation: 482 R Axis:   28 Text Interpretation: Sinus tachycardia Otherwise normal ECG When compared with ECG of 28-Aug-2017 23:50, Nonspecific T wave abnormality no longer evident in Anterior leads Confirmed by Octaviano Glow [phone]) on 06/26/2022 3:41:52 PM  Radiology No results found.  Procedures Procedures    Medications Ordered in ED Medications  potassium chloride SA (KLOR-CON M) CR tablet 40 mEq (40 mEq Oral Given 06/26/22 1735)    ED Course/ Medical Decision Making/ A&P Clinical Course as of 06/26/22 1842  Fri Jun 26, 2022  1724 Patient is not wanting IV potassium at this time due to extreme irritation with her last infusion.  I explained that oral supplementation may not be adequate for how low her potassium is, but this is her preference.  I think is reasonable to discharge her on 20 mg twice daily of oral potassium, as well as switching her off of HCTZ to low-dose hydralazine, until she can see her PCP.  I made it clear that she needs blood work checked early next week to have her potassium level rechecked.  She will contact her PCPs office about this.  She verbalized understanding.  Okay for discharge [MT]    Clinical Course User Index [MT] Rayleigh Gillyard, Carola Rhine, MD                           Medical Decision Making Amount and/or Complexity of Data Reviewed Labs: ordered.  Risk Prescription drug management.   Patient is here with asymptomatic hypokalemia.  I reviewed external records including outpatient labs to confirm potassium 2.8.  We will recheck her numbers here as well as magnesium.  This may be a combination of poor potassium oral intake, as well as HCTZ side effects.  We will consider switching her to a different blood pressure medication, as well as starting her supplemental potassium.  I personally reviewed and interpreted her EKG which shows a sinus rhythm no acute abnormalities, QTc is within relatively normal limits.  I personally  reviewed interpreted her labs today.  Potassium is 2.7 which shows no significant change from her check 2 days ago.  I do not see a precipitous decline.  Magnesium within normal limits.  The patient again is not wanting IV potassium at this time.  See ED course.  We will start her on low-dose hydralazine until she is able to see her PCP for an appropriate substitute, given that she does not tolerate lisinopril or ACE inhibitors.        Final Clinical Impression(s) / ED Diagnoses Final diagnoses:  Hypokalemia    Rx / DC Orders ED Discharge Orders          Ordered    potassium chloride SA (KLOR-CON M) 20 MEQ tablet  2 times daily        06/26/22 1747    hydrALAZINE (APRESOLINE) 10 MG tablet  2 times daily        06/26/22 1748              Terald Sleeper, MD 06/26/22 1842

## 2022-06-26 NOTE — Discharge Instructions (Signed)
Your potassium level is very low today at 2.7.  We discussed giving you IV potassium he did not want IV potassium.  I explained that oral potassium or pills do not work as quickly as IV potassium.  However we decided to start you on a course of pills in the ED.  You will be taking a high-dose of potassium at home for the next several days.  It is very important you contact your primary care clinic about having your potassium level rechecked in the next 3 to 5 days.  We will also be stopping hydrochlorothiazide.  This blood pressure medicine can lower your potassium as a side effect.  Instead is started you on a low-dose of hydralazine which you will take twice a day.  This is a 25 mg hydralazine twice a day.  You should again follow-up with your doctor's office about your blood pressure management.

## 2022-06-26 NOTE — ED Notes (Signed)
Discharge instructions, follow up care, and prescriptions reviewed and explained, pt verbalized understanding and had no further questions on d/c. Pt caox4 and ambulatory on d/c.  

## 2022-10-12 ENCOUNTER — Other Ambulatory Visit: Payer: Self-pay | Admitting: Neurological Surgery

## 2022-10-15 NOTE — Pre-Procedure Instructions (Signed)
Surgical Instructions    Your procedure is scheduled on October 19, 2022.  Report to [facility] Main Entrance "A" at 9:30 A.M., then check in with the Admitting office.  Call this number if you have problems the morning of surgery:  [phone]  If you have any questions prior to your surgery date call [phone]: Open Monday-Friday 8am-4pm If you experience any cold or flu symptoms such as cough, fever, chills, shortness of breath, etc. between now and your scheduled surgery, please notify us at the above number.     Remember:  Do not eat after midnight the night before your surgery  You may drink clear liquids until 8:30 AM the morning of your surgery.   Clear liquids allowed are: Water, Non-Citrus Juices (without pulp), Carbonated Beverages, Clear Tea, Black Coffee Only (NO MILK, CREAM OR POWDERED CREAMER of any kind), and Gatorade.     Take these medicines the morning of surgery with A SIP OF WATER:  amLODipine (NORVASC)   gabapentin (NEURONTIN)   hydrALAZINE (APRESOLINE)   omeprazole (PRILOSEC)   ondansetron (ZOFRAN) - may take if needed   Follow your surgeon's instructions on when to stop Aspirin.  If no instructions were given by your surgeon then you will need to call the office to get those instructions.     As of today, STOP taking any Aleve, Naproxen, Ibuprofen, Motrin, Advil, Goody's, BC's, all herbal medications, fish oil, and all vitamins.   WHAT DO I DO ABOUT MY DIABETES MEDICATION?   Do not take metFORMIN (GLUCOPHAGE) the morning of surgery.    HOW TO MANAGE YOUR DIABETES BEFORE AND AFTER SURGERY  Why is it important to control my blood sugar before and after surgery? Improving blood sugar levels before and after surgery helps healing and can limit problems. A way of improving blood sugar control is eating a healthy diet by:  Eating less sugar and carbohydrates  Increasing activity/exercise  Talking with your doctor about reaching your blood sugar  goals High blood sugars (greater than 180 mg/dL) can raise your risk of infections and slow your recovery, so you will need to focus on controlling your diabetes during the weeks before surgery. Make sure that the doctor who takes care of your diabetes knows about your planned surgery including the date and location.  How do I manage my blood sugar before surgery? Check your blood sugar at least 4 times a day, starting 2 days before surgery, to make sure that the level is not too high or low.  Check your blood sugar the morning of your surgery when you wake up and every 2 hours until you get to the Short Stay unit.  If your blood sugar is less than 70 mg/dL, you will need to treat for low blood sugar: Do not take insulin. Treat a low blood sugar (less than 70 mg/dL) with  cup of clear juice (cranberry or apple), 4 glucose tablets, OR glucose gel. Recheck blood sugar in 15 minutes after treatment (to make sure it is greater than 70 mg/dL). If your blood sugar is not greater than 70 mg/dL on recheck, call [phone] for further instructions. Report your blood sugar to the short stay nurse when you get to Short Stay.  If you are admitted to the hospital after surgery: Your blood sugar will be checked by the staff and you will probably be given insulin after surgery (instead of oral diabetes medicines) to make sure you have good blood sugar levels. The goal for blood  sugar control after surgery is 80-180 mg/dL.                      Do NOT Smoke (Tobacco/Vaping) for 24 hours prior to your procedure.  If you use a CPAP at night, you may bring your mask/headgear for your overnight stay.   Contacts, glasses, piercing's, hearing aid's, dentures or partials may not be worn into surgery, please bring cases for these belongings.    For patients admitted to the hospital, discharge time will be determined by your treatment team.   Patients discharged the day of surgery will not be allowed to drive  home, and someone needs to stay with them for 24 hours.  SURGICAL WAITING ROOM VISITATION Patients having surgery or a procedure may have no more than 2 support people in the waiting area - these visitors may rotate.   Children under the age of 61 must have an adult with them who is not the patient. If the patient needs to stay at the hospital during part of their recovery, the visitor guidelines for inpatient rooms apply. Pre-op nurse will coordinate an appropriate time for 1 support person to accompany patient in pre-op.  This support person may not rotate.   Please refer to the [facility] website for the visitor guidelines for Inpatients (after your surgery is over and you are in a regular room).    Special instructions:   [facility]- Preparing For Surgery  Before surgery, you can play an important role. Because skin is not sterile, your skin needs to be as free of germs as possible. You can reduce the number of germs on your skin by washing with CHG (chlorahexidine gluconate) Soap before surgery.  CHG is an antiseptic cleaner which kills germs and bonds with the skin to continue killing germs even after washing.    Oral Hygiene is also important to reduce your risk of infection.  Remember - BRUSH YOUR TEETH THE MORNING OF SURGERY WITH YOUR REGULAR TOOTHPASTE  Please do not use if you have an allergy to CHG or antibacterial soaps. If your skin becomes reddened/irritated stop using the CHG.  Do not shave (including legs and underarms) for at least 48 hours prior to first CHG shower. It is OK to shave your face.  Please follow these instructions carefully.   Shower the NIGHT BEFORE SURGERY and the MORNING OF SURGERY  If you chose to wash your hair, wash your hair first as usual with your normal shampoo.  After you shampoo, rinse your hair and body thoroughly to remove the shampoo.  Use CHG Soap as you would any other liquid soap. You can apply CHG directly to the skin and wash gently  with a scrungie or a clean washcloth.   Apply the CHG Soap to your body ONLY FROM THE NECK DOWN.  Do not use on open wounds or open sores. Avoid contact with your eyes, ears, mouth and genitals (private parts). Wash Face and genitals (private parts)  with your normal soap.   Wash thoroughly, paying special attention to the area where your surgery will be performed.  Thoroughly rinse your body with warm water from the neck down.  DO NOT shower/wash with your normal soap after using and rinsing off the CHG Soap.  Pat yourself dry with a CLEAN TOWEL.  Wear CLEAN PAJAMAS to bed the night before surgery  Place CLEAN SHEETS on your bed the night before your surgery  DO NOT SLEEP WITH PETS.  Day of Surgery: Take a shower with CHG soap. Do not wear jewelry or makeup Do not wear lotions, powders, perfumes/colognes, or deodorant. Do not shave 48 hours prior to surgery.  Men may shave face and neck. Do not bring valuables to the hospital.  [facility] is not responsible for any belongings or valuables. Do not wear nail polish, gel polish, artificial nails, or any other type of covering on natural nails (fingers and toes) If you have artificial nails or gel coating that need to be removed by a nail salon, please have this removed prior to surgery. Artificial nails or gel coating may interfere with anesthesia's ability to adequately monitor your vital signs.  Wear Clean/Comfortable clothing the morning of surgery Remember to brush your teeth WITH YOUR REGULAR TOOTHPASTE.   Please read over the following fact sheets that you were given.    If you received a COVID test during your pre-op visit  it is requested that you wear a mask when out in public, stay away from anyone that may not be feeling well and notify your surgeon if you develop symptoms. If you have been in contact with anyone that has tested positive in the last 10 days please notify you surgeon.

## 2022-10-16 ENCOUNTER — Encounter (HOSPITAL_COMMUNITY)
Admission: RE | Admit: 2022-10-16 | Discharge: 2022-10-16 | Disposition: A | Discharge status: Home / Self Care | Payer: Medicaid Other | Source: Ambulatory Visit | Attending: Neurological Surgery

## 2022-10-16 ENCOUNTER — Other Ambulatory Visit: Payer: Self-pay

## 2022-10-16 ENCOUNTER — Encounter (HOSPITAL_COMMUNITY): Payer: Self-pay

## 2022-10-16 VITALS — BP 147/107 | HR 94 | Temp 98.3°F | Resp 18 | Ht 65.0 in | Wt 195.1 lb

## 2022-10-16 DIAGNOSIS — E119 Type 2 diabetes mellitus without complications: Secondary | ICD-10-CM | POA: Insufficient documentation

## 2022-10-16 DIAGNOSIS — Z01812 Encounter for preprocedural laboratory examination: Secondary | ICD-10-CM | POA: Diagnosis not present

## 2022-10-16 DIAGNOSIS — Z01818 Encounter for other preprocedural examination: Secondary | ICD-10-CM

## 2022-10-16 HISTORY — DX: Personal history of urinary calculi: Z87.442

## 2022-10-16 HISTORY — DX: Headache, unspecified: R51.9

## 2022-10-16 HISTORY — DX: Anemia, unspecified: D64.9

## 2022-10-16 LAB — CBC
HCT: 38.1 % (ref 36.0–46.0)
Hemoglobin: 13 g/dL (ref 12.0–15.0)
MCH: 33.4 pg (ref 26.0–34.0)
MCHC: 34.1 g/dL (ref 30.0–36.0)
MCV: 97.9 fL (ref 80.0–100.0)
Platelets: 331 10*3/uL (ref 150–400)
RBC: 3.89 MIL/uL (ref 3.87–5.11)
RDW: 12.3 % (ref 11.5–15.5)
WBC: 7.7 10*3/uL (ref 4.0–10.5)
nRBC: 0 % (ref 0.0–0.2)

## 2022-10-16 LAB — BASIC METABOLIC PANEL
Anion gap: 13 (ref 5–15)
BUN: 9 mg/dL (ref 6–20)
CO2: 23 mmol/L (ref 22–32)
Calcium: 9.5 mg/dL (ref 8.9–10.3)
Chloride: 103 mmol/L (ref 98–111)
Creatinine, Ser: 0.94 mg/dL (ref 0.44–1.00)
GFR, Estimated: >60 mL/min (ref >60)
Glucose, Bld: 162 mg/dL — ABNORMAL HIGH (ref 70–99)
Potassium: 3.1 mmol/L — ABNORMAL LOW (ref 3.5–5.1)
Sodium: 139 mmol/L (ref 135–145)

## 2022-10-16 LAB — HEMOGLOBIN A1C
Hgb A1c MFr Bld: 5.4 % (ref 4.8–5.6)
Mean Plasma Glucose: 108 mg/dL

## 2022-10-16 LAB — SURGICAL PCR SCREEN
MRSA, PCR: NEGATIVE
Staphylococcus aureus: NEGATIVE

## 2022-10-16 LAB — GLUCOSE, CAPILLARY: Glucose-Capillary: 176 mg/dL — ABNORMAL HIGH (ref 70–99)

## 2022-10-16 NOTE — Progress Notes (Addendum)
PCP - Dr. Gala Romney Cardiologist - Denies  PPM/ICD - Denies Device Orders - n/a Rep Notified - n/a  Chest x-ray - n/a EKG - 07/01/2022 Stress Test - Denies ECHO - Denies Cardiac Cath - Denies  Sleep Study - Denies CPAP - n/a  Pt is DM2. She checks her blood sugar every other day. Normal fasting range is around 102. CBG at pre-op was 176. For lunch, pt had Chicken Chili rice bowel and water. A1c pending.  Last dose of GLP1 agonist- n/a GLP1 instructions: n/a  Blood Thinner Instructions: n/a Aspirin Instructions: Pt last dose of ASA was 3/16 and she will continue to hold medication until after surgery.  ERAS Protcol - Clear liquids until 0830 morning of surgery PRE-SURGERY Ensure or G2- n/a  COVID TEST- n/a   Anesthesia review: No. Pts BP elevated at pre-op appointment, but pt was visibly in a lot of back pain. Karoline Caldwell, PA-C agreed to not recheck as the result will most likely be similar without any pain relief.  Patient denies shortness of breath, fever, cough and chest pain at PAT appointment. Pt denies any respiratory illness/infection in the last two months.   All instructions explained to the patient, with a verbal understanding of the material. Patient agrees to go over the instructions while at home for a better understanding. Patient also instructed to self quarantine after being tested for COVID-19. The opportunity to ask questions was provided.

## 2022-10-18 NOTE — Anesthesia Preprocedure Evaluation (Signed)
Anesthesia Evaluation  Patient identified by MRN, date of birth, ID band Patient awake    Reviewed: Allergy & Precautions, NPO status , Patient's Chart, lab work & pertinent test results  History of Anesthesia Complications Negative for: history of anesthetic complications  Airway Mallampati: II  TM Distance: >3 FB Neck ROM: Full    Dental  (+) Teeth Intact, Dental Advisory Given   Pulmonary Current Smoker Snores at night, has never been tested for OSA 2cigg/d   Pulmonary exam normal breath sounds clear to auscultation       Cardiovascular hypertension (156/111 preop, per pt normally this high), Pt. on medications Normal cardiovascular exam Rhythm:Regular Rate:Normal     Neuro/Psych  Headaches Intervertebral disc disorders with radiculopathy, Lumbar region  negative psych ROS   GI/Hepatic ,GERD  Medicated and Controlled,,(+)     substance abuse (only occasional)  marijuana use  Endo/Other  diabetes, Well Controlled, Type 2, Oral Hypoglycemic Agents    Renal/GU   negative genitourinary   Musculoskeletal negative musculoskeletal ROS (+)    Abdominal  (+) + obese  Peds  Hematology   Anesthesia Other Findings   Reproductive/Obstetrics negative OB ROS                              Anesthesia Physical Anesthesia Plan  ASA: 2  Anesthesia Plan: General   Post-op Pain Management: Tylenol PO (pre-op)* and Ketamine IV*   Induction: Intravenous  PONV Risk Score and Plan: 2 and Treatment may vary due to age or medical condition, Ondansetron, Dexamethasone, Midazolam and Scopolamine patch - Pre-op  Airway Management Planned: Oral ETT  Additional Equipment: None  Intra-op Plan:   Post-operative Plan: Extubation in OR  Informed Consent: I have reviewed the patients History and Physical, chart, labs and discussed the procedure including the risks, benefits and alternatives for the  proposed anesthesia with the patient or authorized representative who has indicated his/her understanding and acceptance.     Dental advisory given  Plan Discussed with: CRNA  Anesthesia Plan Comments:         Anesthesia Quick Evaluation

## 2022-10-19 ENCOUNTER — Encounter (HOSPITAL_COMMUNITY): Admission: RE | Payer: Self-pay | Source: Home / Self Care

## 2022-10-19 ENCOUNTER — Ambulatory Visit (HOSPITAL_COMMUNITY)
Admission: RE | Admit: 2022-10-19 | Discharge: 2022-10-19 | Disposition: A | Discharge status: Home / Self Care | Payer: Medicaid Other | Attending: Neurological Surgery | Admitting: Neurological Surgery

## 2022-10-19 ENCOUNTER — Encounter (HOSPITAL_COMMUNITY)
Admission: RE | Disposition: A | Discharge status: Home / Self Care | Payer: Self-pay | Source: Home / Self Care | Attending: Neurological Surgery

## 2022-10-19 ENCOUNTER — Ambulatory Visit (HOSPITAL_COMMUNITY): Payer: Medicaid Other | Admitting: Anesthesiology

## 2022-10-19 ENCOUNTER — Other Ambulatory Visit: Payer: Self-pay

## 2022-10-19 ENCOUNTER — Ambulatory Visit (HOSPITAL_BASED_OUTPATIENT_CLINIC_OR_DEPARTMENT_OTHER): Payer: Medicaid Other | Admitting: Anesthesiology

## 2022-10-19 ENCOUNTER — Ambulatory Visit (HOSPITAL_COMMUNITY)
Admission: RE | Admit: 2022-10-19 | Payer: No Typology Code available for payment source | Source: Home / Self Care | Admitting: Neurological Surgery

## 2022-10-19 ENCOUNTER — Ambulatory Visit (HOSPITAL_COMMUNITY): Payer: Medicaid Other

## 2022-10-19 ENCOUNTER — Encounter (HOSPITAL_COMMUNITY): Payer: Self-pay | Admitting: Neurological Surgery

## 2022-10-19 DIAGNOSIS — Z833 Family history of diabetes mellitus: Secondary | ICD-10-CM | POA: Insufficient documentation

## 2022-10-19 DIAGNOSIS — M5116 Intervertebral disc disorders with radiculopathy, lumbar region: Secondary | ICD-10-CM | POA: Diagnosis not present

## 2022-10-19 DIAGNOSIS — Z6833 Body mass index (BMI) 33.0-33.9, adult: Secondary | ICD-10-CM | POA: Insufficient documentation

## 2022-10-19 DIAGNOSIS — F17210 Nicotine dependence, cigarettes, uncomplicated: Secondary | ICD-10-CM | POA: Insufficient documentation

## 2022-10-19 DIAGNOSIS — Z01818 Encounter for other preprocedural examination: Secondary | ICD-10-CM

## 2022-10-19 DIAGNOSIS — E119 Type 2 diabetes mellitus without complications: Secondary | ICD-10-CM | POA: Diagnosis not present

## 2022-10-19 DIAGNOSIS — G4733 Obstructive sleep apnea (adult) (pediatric): Secondary | ICD-10-CM | POA: Diagnosis not present

## 2022-10-19 DIAGNOSIS — K219 Gastro-esophageal reflux disease without esophagitis: Secondary | ICD-10-CM | POA: Diagnosis not present

## 2022-10-19 DIAGNOSIS — F1290 Cannabis use, unspecified, uncomplicated: Secondary | ICD-10-CM | POA: Insufficient documentation

## 2022-10-19 DIAGNOSIS — E669 Obesity, unspecified: Secondary | ICD-10-CM | POA: Diagnosis not present

## 2022-10-19 DIAGNOSIS — Z8249 Family history of ischemic heart disease and other diseases of the circulatory system: Secondary | ICD-10-CM | POA: Insufficient documentation

## 2022-10-19 HISTORY — PX: LUMBAR LAMINECTOMY/ DECOMPRESSION WITH MET-RX: SHX5959

## 2022-10-19 LAB — GLUCOSE, CAPILLARY
Glucose-Capillary: 152 mg/dL — ABNORMAL HIGH (ref 70–99)
Glucose-Capillary: 88 mg/dL (ref 70–99)
Glucose-Capillary: 115 mg/dL — ABNORMAL HIGH (ref 70–99)

## 2022-10-19 SURGERY — LUMBAR LAMINECTOMY/ DECOMPRESSION WITH MET-RX
Anesthesia: General | Laterality: Left

## 2022-10-19 MED ORDER — ONDANSETRON HCL 4 MG/2ML IJ SOLN
INTRAMUSCULAR | Status: DC | PRN
  Administered 2022-10-19: 4 mg via INTRAVENOUS

## 2022-10-19 MED ORDER — PHENYLEPHRINE HCL-NACL 20-0.9 MG/250ML-% IV SOLN
INTRAVENOUS | Status: DC | PRN
  Administered 2022-10-19: 50 ug/min via INTRAVENOUS

## 2022-10-19 MED ORDER — HYDROMORPHONE HCL 1 MG/ML IJ SOLN
INTRAMUSCULAR | Status: AC
  Filled 2022-10-19: qty 1

## 2022-10-19 MED ORDER — DROPERIDOL 2.5 MG/ML IJ SOLN
0.6250 mg | Freq: Once | INTRAMUSCULAR | Status: AC | PRN
  Administered 2022-10-19: 0.625 mg via INTRAVENOUS

## 2022-10-19 MED ORDER — INSULIN ASPART 100 UNIT/ML IJ SOLN
0.0000 [IU] | INTRAMUSCULAR | Status: DC | PRN
  Administered 2022-10-19: 2 [IU] via SUBCUTANEOUS
  Filled 2022-10-19: qty 1

## 2022-10-19 MED ORDER — LIDOCAINE 2% (20 MG/ML) 5 ML SYRINGE
INTRAMUSCULAR | Status: DC | PRN
  Administered 2022-10-19: 60 mg via INTRAVENOUS

## 2022-10-19 MED ORDER — OXYCODONE-ACETAMINOPHEN 5-325 MG PO TABS: 1.0000 | ORAL_TABLET | ORAL | 0 refills | Status: DC | PRN

## 2022-10-19 MED ORDER — EPHEDRINE 5 MG/ML INJ
INTRAVENOUS | Status: AC
  Filled 2022-10-19: qty 5

## 2022-10-19 MED ORDER — SUGAMMADEX SODIUM 200 MG/2ML IV SOLN
INTRAVENOUS | Status: DC | PRN
  Administered 2022-10-19: 200 mg via INTRAVENOUS

## 2022-10-19 MED ORDER — DEXAMETHASONE SODIUM PHOSPHATE 10 MG/ML IJ SOLN
INTRAMUSCULAR | Status: AC
  Filled 2022-10-19: qty 1

## 2022-10-19 MED ORDER — HYDROMORPHONE HCL 1 MG/ML IJ SOLN
0.2500 mg | INTRAMUSCULAR | Status: DC | PRN
  Administered 2022-10-19 (×2): 0.5 mg via INTRAVENOUS

## 2022-10-19 MED ORDER — 0.9 % SODIUM CHLORIDE (POUR BTL) OPTIME
TOPICAL | Status: DC | PRN
  Administered 2022-10-19: 1000 mL

## 2022-10-19 MED ORDER — KETAMINE HCL 10 MG/ML IJ SOLN
INTRAMUSCULAR | Status: DC | PRN
  Administered 2022-10-19: 20 mg via INTRAVENOUS
  Administered 2022-10-19: 10 mg via INTRAVENOUS

## 2022-10-19 MED ORDER — THROMBIN 5000 UNITS EX SOLR
CUTANEOUS | Status: AC
  Filled 2022-10-19: qty 5000

## 2022-10-19 MED ORDER — ROCURONIUM BROMIDE 10 MG/ML (PF) SYRINGE
PREFILLED_SYRINGE | INTRAVENOUS | Status: DC | PRN
  Administered 2022-10-19: 100 mg via INTRAVENOUS

## 2022-10-19 MED ORDER — LIDOCAINE 2% (20 MG/ML) 5 ML SYRINGE
INTRAMUSCULAR | Status: AC
  Filled 2022-10-19: qty 5

## 2022-10-19 MED ORDER — SCOPOLAMINE 1 MG/3DAYS TD PT72
1.0000 | MEDICATED_PATCH | Freq: Once | TRANSDERMAL | Status: DC
  Administered 2022-10-19: 1.5 mg via TRANSDERMAL
  Filled 2022-10-19: qty 1

## 2022-10-19 MED ORDER — FENTANYL CITRATE (PF) 250 MCG/5ML IJ SOLN
INTRAMUSCULAR | Status: DC | PRN
  Administered 2022-10-19 (×2): 50 ug via INTRAVENOUS

## 2022-10-19 MED ORDER — EPHEDRINE SULFATE-NACL 50-0.9 MG/10ML-% IV SOSY
PREFILLED_SYRINGE | INTRAVENOUS | Status: DC | PRN
  Administered 2022-10-19: 5 mg via INTRAVENOUS

## 2022-10-19 MED ORDER — LACTATED RINGERS IV SOLN: INTRAVENOUS | Status: DC

## 2022-10-19 MED ORDER — PHENYLEPHRINE 80 MCG/ML (10ML) SYRINGE FOR IV PUSH (FOR BLOOD PRESSURE SUPPORT)
PREFILLED_SYRINGE | INTRAVENOUS | Status: AC
  Filled 2022-10-19: qty 10

## 2022-10-19 MED ORDER — CEFAZOLIN SODIUM-DEXTROSE 2-4 GM/100ML-% IV SOLN
2.0000 g | INTRAVENOUS | Status: AC
  Administered 2022-10-19: 2 g via INTRAVENOUS
  Filled 2022-10-19: qty 100

## 2022-10-19 MED ORDER — PROPOFOL 10 MG/ML IV BOLUS
INTRAVENOUS | Status: DC | PRN
  Administered 2022-10-19: 200 mg via INTRAVENOUS

## 2022-10-19 MED ORDER — THROMBIN 5000 UNITS EX SOLR
OROMUCOSAL | Status: DC | PRN
  Administered 2022-10-19: 5 mL via TOPICAL

## 2022-10-19 MED ORDER — KETAMINE HCL 50 MG/5ML IJ SOSY
PREFILLED_SYRINGE | INTRAMUSCULAR | Status: AC
  Filled 2022-10-19: qty 5

## 2022-10-19 MED ORDER — LIDOCAINE-EPINEPHRINE 1 %-1:100000 IJ SOLN
INTRAMUSCULAR | Status: DC | PRN
  Administered 2022-10-19: 6 mL

## 2022-10-19 MED ORDER — DROPERIDOL 2.5 MG/ML IJ SOLN
INTRAMUSCULAR | Status: AC
  Filled 2022-10-19: qty 2

## 2022-10-19 MED ORDER — ONDANSETRON HCL 4 MG/2ML IJ SOLN
INTRAMUSCULAR | Status: AC
  Filled 2022-10-19: qty 2

## 2022-10-19 MED ORDER — CHLORHEXIDINE GLUCONATE CLOTH 2 % EX PADS: 6.0000 | MEDICATED_PAD | Freq: Once | CUTANEOUS | Status: DC

## 2022-10-19 MED ORDER — ACETAMINOPHEN 500 MG PO TABS
1000.0000 mg | ORAL_TABLET | Freq: Once | ORAL | Status: AC
  Administered 2022-10-19: 1000 mg via ORAL
  Filled 2022-10-19: qty 2

## 2022-10-19 MED ORDER — CHLORHEXIDINE GLUCONATE 0.12 % MT SOLN
15.0000 mL | Freq: Once | OROMUCOSAL | Status: AC
  Administered 2022-10-19: 15 mL via OROMUCOSAL
  Filled 2022-10-19: qty 15

## 2022-10-19 MED ORDER — MIDAZOLAM HCL 2 MG/2ML IJ SOLN
INTRAMUSCULAR | Status: DC | PRN
  Administered 2022-10-19: 2 mg via INTRAVENOUS

## 2022-10-19 MED ORDER — LIDOCAINE-EPINEPHRINE 1 %-1:100000 IJ SOLN
INTRAMUSCULAR | Status: AC
  Filled 2022-10-19: qty 1

## 2022-10-19 MED ORDER — DEXAMETHASONE SODIUM PHOSPHATE 10 MG/ML IJ SOLN
INTRAMUSCULAR | Status: DC | PRN
  Administered 2022-10-19: 5 mg via INTRAVENOUS

## 2022-10-19 MED ORDER — ROCURONIUM BROMIDE 10 MG/ML (PF) SYRINGE
PREFILLED_SYRINGE | INTRAVENOUS | Status: AC
  Filled 2022-10-19: qty 10

## 2022-10-19 MED ORDER — MIDAZOLAM HCL 2 MG/2ML IJ SOLN
INTRAMUSCULAR | Status: AC
  Filled 2022-10-19: qty 2

## 2022-10-19 MED ORDER — PROPOFOL 10 MG/ML IV BOLUS
INTRAVENOUS | Status: AC
  Filled 2022-10-19: qty 20

## 2022-10-19 MED ORDER — ORAL CARE MOUTH RINSE: 15.0000 mL | Freq: Once | OROMUCOSAL | Status: AC

## 2022-10-19 MED ORDER — FENTANYL CITRATE (PF) 250 MCG/5ML IJ SOLN
INTRAMUSCULAR | Status: AC
  Filled 2022-10-19: qty 5

## 2022-10-19 MED ORDER — PHENYLEPHRINE 80 MCG/ML (10ML) SYRINGE FOR IV PUSH (FOR BLOOD PRESSURE SUPPORT)
PREFILLED_SYRINGE | INTRAVENOUS | Status: DC | PRN
  Administered 2022-10-19: 120 ug via INTRAVENOUS
  Administered 2022-10-19: 160 ug via INTRAVENOUS
  Administered 2022-10-19: 240 ug via INTRAVENOUS
  Administered 2022-10-19: 160 ug via INTRAVENOUS
  Administered 2022-10-19: 120 ug via INTRAVENOUS

## 2022-10-19 MED ORDER — CYCLOBENZAPRINE HCL 10 MG PO TABS: 10.0000 mg | ORAL_TABLET | Freq: Three times a day (TID) | ORAL | 0 refills | Status: DC | PRN

## 2022-10-19 SURGICAL SUPPLY — 53 items
ADH SKN CLS APL DERMABOND .7 (GAUZE/BANDAGES/DRESSINGS) ×1
BAG COUNTER SPONGE SURGICOUNT (BAG) ×2 IMPLANT
BAG SPNG CNTER NS LX DISP (BAG) ×2
BAND INSRT 18 STRL LF DISP RB (MISCELLANEOUS) ×2
BAND RUBBER #18 3X1/16 STRL (MISCELLANEOUS) ×4 IMPLANT
BLADE CLIPPER SURG (BLADE) IMPLANT
BLADE SURG 11 STRL SS (BLADE) ×2 IMPLANT
BUR SURG IBUR 4X12.5 (BURR) IMPLANT
BUR SURGICAL HEAD PROX 3X12.5 (BURR) ×2 IMPLANT
BURR SURG IBUR 4X12.5 (BURR)
BURR SURGICAL HEAD PROX 3X12.5 (BURR) ×1
CANISTER SUCT 3000ML PPV (MISCELLANEOUS) ×2 IMPLANT
DERMABOND ADVANCED .7 DNX12 (GAUZE/BANDAGES/DRESSINGS) ×2 IMPLANT
DRAPE C-ARM 42X72 X-RAY (DRAPES) ×4 IMPLANT
DRAPE LAPAROTOMY 100X72X124 (DRAPES) ×2 IMPLANT
DRAPE MICROSCOPE SLANT 54X150 (MISCELLANEOUS) ×2 IMPLANT
DRAPE SURG 17X23 STRL (DRAPES) ×2 IMPLANT
DURAPREP 26ML APPLICATOR (WOUND CARE) ×2 IMPLANT
ELECT BLADE 6.5 EXT (BLADE) ×2 IMPLANT
ELECT REM PT RETURN 9FT ADLT (ELECTROSURGICAL) ×1
ELECTRODE REM PT RTRN 9FT ADLT (ELECTROSURGICAL) ×2 IMPLANT
GAUZE 4X4 16PLY ~~LOC~~+RFID DBL (SPONGE) IMPLANT
GAUZE SPONGE 4X4 12PLY STRL (GAUZE/BANDAGES/DRESSINGS) IMPLANT
GLOVE BIOGEL PI IND STRL 7.5 (GLOVE) ×2 IMPLANT
GLOVE ECLIPSE 7.5 STRL STRAW (GLOVE) ×2 IMPLANT
GLOVE EXAM NITRILE LRG STRL (GLOVE) IMPLANT
GLOVE EXAM NITRILE XL STR (GLOVE) IMPLANT
GLOVE EXAM NITRILE XS STR PU (GLOVE) IMPLANT
GOWN STRL REUS W/ TWL LRG LVL3 (GOWN DISPOSABLE) ×4 IMPLANT
GOWN STRL REUS W/ TWL XL LVL3 (GOWN DISPOSABLE) IMPLANT
GOWN STRL REUS W/TWL 2XL LVL3 (GOWN DISPOSABLE) IMPLANT
GOWN STRL REUS W/TWL LRG LVL3 (GOWN DISPOSABLE) ×2
GOWN STRL REUS W/TWL XL LVL3 (GOWN DISPOSABLE) ×1
HEMOSTAT POWDER KIT SURGIFOAM (HEMOSTASIS) ×2 IMPLANT
KIT BASIN OR (CUSTOM PROCEDURE TRAY) ×2 IMPLANT
KIT TURNOVER KIT B (KITS) ×2 IMPLANT
NDL SPNL 18GX3.5 QUINCKE PK (NEEDLE) ×2 IMPLANT
NEEDLE HYPO 22GX1.5 SAFETY (NEEDLE) ×2 IMPLANT
NEEDLE SPNL 18GX3.5 QUINCKE PK (NEEDLE) ×1 IMPLANT
NS IRRIG 1000ML POUR BTL (IV SOLUTION) ×2 IMPLANT
PACK LAMINECTOMY NEURO (CUSTOM PROCEDURE TRAY) ×2 IMPLANT
PAD ARMBOARD 7.5X6 YLW CONV (MISCELLANEOUS) ×6 IMPLANT
SOL ELECTROSURG ANTI STICK (MISCELLANEOUS) ×1
SOLUTION ELECTROSURG ANTI STCK (MISCELLANEOUS) ×2 IMPLANT
SPIKE FLUID TRANSFER (MISCELLANEOUS) ×2 IMPLANT
SPONGE T-LAP 4X18 ~~LOC~~+RFID (SPONGE) IMPLANT
SUT MNCRL AB 3-0 PS2 18 (SUTURE) IMPLANT
SUT VIC AB 0 CT1 18XCR BRD8 (SUTURE) IMPLANT
SUT VIC AB 0 CT1 8-18 (SUTURE)
SUT VIC AB 2-0 CP2 18 (SUTURE) ×2 IMPLANT
TOWEL GREEN STERILE (TOWEL DISPOSABLE) ×2 IMPLANT
TOWEL GREEN STERILE FF (TOWEL DISPOSABLE) ×2 IMPLANT
WATER STERILE IRR 1000ML POUR (IV SOLUTION) ×2 IMPLANT

## 2022-10-19 NOTE — Transfer of Care (Signed)
Immediate Anesthesia Transfer of Care Note  Patient: Suzanne [name]  Procedure(s) Performed: Left Lumbar five-Sacral one Microlaminectomy and discectomy (Left)  Patient Location: PACU  Anesthesia Type:General  Level of Consciousness: awake, alert , and oriented  Airway & Oxygen Therapy: Patient Spontanous Breathing and Patient connected to nasal cannula oxygen  Post-op Assessment: Report given to RN and Post -op Vital signs reviewed and stable  Post vital signs: Reviewed and stable  Last Vitals:  Vitals Value Taken Time  BP 155/98 10/19/22 1355  Temp    Pulse 91 10/19/22 1400  Resp 17 10/19/22 1358  SpO2 100 % 10/19/22 1400  Vitals shown include unvalidated device data.  Last Pain:  Vitals:   10/19/22 0957  TempSrc:   PainSc: 10-Worst pain ever         Complications: No notable events documented.

## 2022-10-19 NOTE — Anesthesia Postprocedure Evaluation (Signed)
Anesthesia Post Note  Patient: Suzanne [name]  Procedure(s) Performed: Left Lumbar five-Sacral one Microlaminectomy and discectomy (Left)     Patient location during evaluation: PACU Anesthesia Type: General Level of consciousness: awake and alert, oriented and patient cooperative Pain management: pain level controlled Vital Signs Assessment: post-procedure vital signs reviewed and stable Respiratory status: spontaneous breathing, nonlabored ventilation and respiratory function stable Cardiovascular status: blood pressure returned to baseline and stable Postop Assessment: no apparent nausea or vomiting Anesthetic complications: no   No notable events documented.  Last Vitals:  Vitals:   10/19/22 1415 10/19/22 1430  BP: (!) 142/91 (!) 132/96  Pulse: 84 76  Resp: 13 15  Temp:  36.7 C  SpO2: 99% 99%                 Pervis Hocking

## 2022-10-19 NOTE — H&P (Signed)
Surgical H&P Update  HPI: 46 y.o. with a history of back and left lower extremity radicular pain. Workup showed a left L5-S1 disc herniation. No changes in health since they were last seen. Still having the above and wishes to proceed with surgery.  PMHx:  Past Medical History:  Diagnosis Date   Anemia    Diabetes mellitus without complication (HCC)    GERD (gastroesophageal reflux disease)    Headache    History of kidney stones    Hypertension    Sciatica    FamHx:  Family History  Problem Relation Age of Onset   Diabetes Mother    Hypertension Mother    GER disease Mother    SocHx:  reports that she has been smoking cigarettes. She has a 3.75 pack-year smoking history. She has never used smokeless tobacco. She reports current drug use. Frequency: 1.00 time per week. Drug: Marijuana. She reports that she does not drink alcohol.  Physical Exam: Strength 5/5 x4 and SILTx4   Assesment/Plan: 47 y.o. woman with LLE radiculopathy 2/2 L5-S1 HNP, here for left L5-S1 MIS discectomy. Risks, benefits, and alternatives discussed and the patient would like to continue with surgery.  -OR today -3C versus home from PACU post-op  Judith Part, MD 10/19/22 12:06 PM

## 2022-10-19 NOTE — Op Note (Signed)
PATIENT: Suzanne [name]  DAY OF SURGERY: 10/19/22   PRE-OPERATIVE DIAGNOSIS:  Herniated nucleus pulposus with lumbar radiculopathy   POST-OPERATIVE DIAGNOSIS:  Herniated nucleus pulposus with lumbar radiculopathy   PROCEDURE:  Left minimally invasive L5-S1 discectomy   SURGEON:  Surgeon(s) and Role:    Judith Part, MD     Norm Parcel PA    ANESTHESIA: ETGA   BRIEF HISTORY: This is a 47 year old woman who presented with refractory LLE radicular pain with low back pain, MRI showed a corresponding disc herniation and and nerve root compression. I therefore recommended a minimally invasive left L5-S1 discectomy. This was discussed with the patient as well as risks, benefits, and alternatives and the patient wished to proceed with surgical treatment.    OPERATIVE DETAIL: The patient was taken to the operating room and placed on the OR table in the prone position. A formal time out was performed with two patient identifiers and confirmed the operative site. Anesthesia was induced by the anesthesia team. The operative site was marked, hair was clipped with surgical clippers, the area was then prepped and draped in a sterile fashion. Fluoroscopy was used to identify the surgical level prior to incision.   A 2cm incision was then marked 1cm off to the left of midline. The fascia was incised sharply and serial dilators were docked to the lamino-facet junction using fluoroscopy to confirm position as well as perform a second count to confirm the correct surgical level. After a final dilator was placed, a tubular retractor was placed over this and secured to the table. The operating microscope was draped and brought into the field. Anatomy was palpated and confirmed, monopolar cautery was used to expose the facet, lamina, and a portion of the spinous process to confirm orientation. A high speed drill and kerrison rongeurs were then used to create a hemilaminotomy and small medial facetectomy.  The ligamentum flavum was resected and the thecal sac and traversing nerve root were identified. The traversing nerve root was gently retracted medially to expose the disc space using a suction-retractor. A disc herniation was clearly present. An annulotomy was created sharply and large, free disc fragments that were easily removed. The annulotomy was explored and additional free fragments were removed. The traversing nerve root was palpated throughout its visible course to confirm good decompression. The medial aspect of the neuro-foramen was also probed with a right angle ball-tip probe to confirm decompression of the exiting nerve root.   Hemostasis was obtained, the wound was copiously irrigated, and the tube was removed while using the microscope to confirm hemostasis of the muscle edges. All instrument and sponge counts were correct and the incision was then closed in layers. The patient was then returned to anesthesia for emergence. No apparent complications at the completion of the procedure.    EBL:  56mL   DRAINS: none   SPECIMENS: none   Judith Part, MD 10/19/22 12:08 PM

## 2022-10-19 NOTE — Anesthesia Procedure Notes (Signed)
Procedure Name: Intubation Date/Time: 10/19/2022 12:33 PM  Performed by: Mariea Clonts, CRNAPre-anesthesia Checklist: Patient identified, Emergency Drugs available, Suction available and Patient being monitored Patient Re-evaluated:Patient Re-evaluated prior to induction Oxygen Delivery Method: Circle System Utilized Preoxygenation: Pre-oxygenation with 100% oxygen Induction Type: IV induction Ventilation: Mask ventilation without difficulty Laryngoscope Size: Miller and 2 Grade View: Grade I Tube type: Oral Tube size: 7.0 mm Number of attempts: 1 Airway Equipment and Method: Stylet and Oral airway Placement Confirmation: ETT inserted through vocal cords under direct vision, positive ETCO2 and breath sounds checked- equal and bilateral Tube secured with: Tape Dental Injury: Teeth and Oropharynx as per pre-operative assessment

## 2022-10-20 ENCOUNTER — Emergency Department (HOSPITAL_COMMUNITY)
Admission: EM | Admit: 2022-10-20 | Discharge: 2022-10-20 | Disposition: A | Discharge status: Home / Self Care | Payer: Medicaid Other | Attending: Emergency Medicine | Admitting: Emergency Medicine

## 2022-10-20 ENCOUNTER — Encounter (HOSPITAL_COMMUNITY): Payer: Self-pay | Admitting: Neurological Surgery

## 2022-10-20 ENCOUNTER — Other Ambulatory Visit: Payer: Self-pay

## 2022-10-20 DIAGNOSIS — E119 Type 2 diabetes mellitus without complications: Secondary | ICD-10-CM | POA: Diagnosis not present

## 2022-10-20 DIAGNOSIS — Z91040 Latex allergy status: Secondary | ICD-10-CM | POA: Diagnosis not present

## 2022-10-20 DIAGNOSIS — I10 Essential (primary) hypertension: Secondary | ICD-10-CM | POA: Insufficient documentation

## 2022-10-20 DIAGNOSIS — Z79899 Other long term (current) drug therapy: Secondary | ICD-10-CM | POA: Insufficient documentation

## 2022-10-20 DIAGNOSIS — Z7984 Long term (current) use of oral hypoglycemic drugs: Secondary | ICD-10-CM | POA: Insufficient documentation

## 2022-10-20 DIAGNOSIS — Z7982 Long term (current) use of aspirin: Secondary | ICD-10-CM | POA: Insufficient documentation

## 2022-10-20 DIAGNOSIS — M545 Low back pain, unspecified: Secondary | ICD-10-CM | POA: Diagnosis not present

## 2022-10-20 DIAGNOSIS — M549 Dorsalgia, unspecified: Secondary | ICD-10-CM

## 2022-10-20 LAB — CBC
HCT: 36.1 % (ref 36.0–46.0)
Hemoglobin: 12.1 g/dL (ref 12.0–15.0)
MCH: 33.5 pg (ref 26.0–34.0)
MCHC: 33.5 g/dL (ref 30.0–36.0)
MCV: 100 fL (ref 80.0–100.0)
Platelets: 314 10*3/uL (ref 150–400)
RBC: 3.61 MIL/uL — ABNORMAL LOW (ref 3.87–5.11)
RDW: 12.3 % (ref 11.5–15.5)
WBC: 14.2 10*3/uL — ABNORMAL HIGH (ref 4.0–10.5)
nRBC: 0 % (ref 0.0–0.2)

## 2022-10-20 LAB — BASIC METABOLIC PANEL
Anion gap: 9 (ref 5–15)
BUN: 11 mg/dL (ref 6–20)
CO2: 23 mmol/L (ref 22–32)
Calcium: 9.3 mg/dL (ref 8.9–10.3)
Chloride: 104 mmol/L (ref 98–111)
Creatinine, Ser: 0.88 mg/dL (ref 0.44–1.00)
GFR, Estimated: >60 mL/min (ref >60)
Glucose, Bld: 172 mg/dL — ABNORMAL HIGH (ref 70–99)
Potassium: 3.1 mmol/L — ABNORMAL LOW (ref 3.5–5.1)
Sodium: 136 mmol/L (ref 135–145)

## 2022-10-20 MED ORDER — HYDROMORPHONE HCL 1 MG/ML IJ SOLN
1.0000 mg | Freq: Once | INTRAMUSCULAR | Status: AC
  Administered 2022-10-20: 1 mg via INTRAVENOUS
  Filled 2022-10-20: qty 1

## 2022-10-20 MED ORDER — METHYLPREDNISOLONE 4 MG PO TBPK: ORAL_TABLET | ORAL | 0 refills | Status: DC

## 2022-10-20 NOTE — ED Triage Notes (Signed)
Pt to [place] to ED for back pain from home. Pt had surgery L5 for compressed disc yesterday. Pt took 325mg  percocet PTA and 50 mcg fentanyl.   EMS vitals  150/88 80 HR 95 %  BGL 161

## 2022-10-20 NOTE — ED Provider Notes (Signed)
[facility] Provider Note   CSN: [id number] Arrival date & time: 10/20/22  0406     History  Chief Complaint  Patient presents with   Back Pain    Suzanne [name] is a 47 y.o. female.  Patient with history of diabetes, hypertension, GERD presents today with complaints of back pain. She states that she had a left L5-S1 microlaminectomy and discectomy with Dr. Zada Finders yesterday that was without complication. She was discharged and was without pain. She has been ambulatory since the procedure. States that she went to bed last night without pain and woke up this morning with significant pain in the middle of her lower back at her incision site. She states that she took 325 mg percocet and 50 mcg fentanyl about 1 hour prior to arrival with some relief but pain was still 7/10. Presents for same. Denies any sharp shooting pain down her legs or numbness/tingling in extremities.  Denies any loss of bowel or bladder function or saddle paresthesias.    The history is provided by the patient. No language interpreter was used.  Back Pain      Home Medications Prior to Admission medications   Medication Sig Start Date End Date Taking? Authorizing Provider  amLODipine (NORVASC) 10 MG tablet Take 1 tablet (10 mg total) by mouth daily. 08/18/17   Luvenia Redden, PA-C  aspirin 81 MG chewable tablet Chew 81 mg by mouth in the morning.    [provider]  cyclobenzaprine (FLEXERIL) 10 MG tablet Take 1 tablet (10 mg total) by mouth 3 (three) times daily as needed for muscle spasms. 10/19/22   Cosentino, Laurin Coder, PA-C  gabapentin (NEURONTIN) 300 MG capsule Take 300 mg by mouth 3 (three) times daily.    [provider]  hydrALAZINE (APRESOLINE) 10 MG tablet Take 1 tablet (10 mg total) by mouth in the morning and at bedtime. 06/26/22 10/13/23  Wyvonnia Dusky, MD  metFORMIN (GLUCOPHAGE) 500 MG tablet Take 1 tablet (500 mg total) by mouth 2  (two) times daily with a meal. 03/30/22 10/13/23  Curatolo, Adam, DO  omeprazole (PRILOSEC) 20 MG capsule Take 20 mg by mouth in the morning.    [provider]  ondansetron (ZOFRAN) 4 MG tablet Take 1 tablet (4 mg total) by mouth every 8 (eight) hours as needed for nausea or vomiting. 05/19/22   Blue, Soijett A, PA-C  OVER THE COUNTER MEDICATION Take 1 tablet by mouth in the morning. Nervive Nerve Relief Tablets    [provider]  oxyCODONE-acetaminophen (PERCOCET) 5-325 MG tablet Take 1 tablet by mouth every 4 (four) hours as needed for severe pain. 10/19/22 10/19/23  Collene Schlichter, PA-C  potassium chloride SA (KLOR-CON M) 20 MEQ tablet Take 1 tablet (20 mEq total) by mouth 2 (two) times daily for 10 days. 06/26/22 10/13/23  Wyvonnia Dusky, MD      Allergies    Coconut flavor, Latex, Pineapple, and Tomato    Review of Systems   Review of Systems  Musculoskeletal:  Positive for back pain.  All other systems reviewed and are negative.   Physical Exam Updated Vital Signs BP 137/87 (BP Location: Right Arm)   Temp 98.7 F (37.1 C) (Oral)   Resp 17   Ht 5\' 5"  (1.651 m)   Wt 88.5 kg   LMP 04/13/2022   SpO2 99%   BMI 32.45 kg/m  Physical Exam Vitals and nursing note reviewed.  Constitutional:  General: She is not in acute distress.    Appearance: Normal appearance. She is normal weight. She is not ill-appearing, toxic-appearing or diaphoretic.  HENT:     Head: Normocephalic and atraumatic.  Cardiovascular:     Rate and Rhythm: Normal rate.  Pulmonary:     Effort: Pulmonary effort is normal. No respiratory distress.  Musculoskeletal:        General: Normal range of motion.     Cervical back: Normal range of motion.     Comments: Well appearing surgical site noted to the back. No purulence, erythema, warmth, stepoffs, lesions, or deformity.  5/5 strength and sensation intact to bilateral lower extremities.  Skin:    General: Skin is warm and dry.   Neurological:     General: No focal deficit present.     Mental Status: She is alert.  Psychiatric:        Mood and Affect: Mood normal.        Behavior: Behavior normal.     ED Results / Procedures / Treatments   Labs (all labs ordered are listed, but only abnormal results are displayed) Labs Reviewed  CBC  BASIC METABOLIC PANEL    EKG None  Radiology DG Lumbar Spine 1 View  Result Date: 10/19/2022 CLINICAL DATA:  L5-S1 micro laminectomy and discectomy. EXAM: LUMBAR SPINE - 1 VIEW COMPARISON:  CT AP from 05/19/2022 FINDINGS: Single portable lateral radiograph of the lumbar spine obtain via C-arm radiography in the operating room was submitted. Images show a surgical probe in the projection of the L5 spinous process and posterior to the mid L5 vertebral body. IMPRESSION: Surgical probe is posterior to the L5 vertebral body. Electronically Signed   By: Kerby Moors M.D.   On: 10/19/2022 14:03   DG C-Arm 1-60 Min-No Report  Result Date: 10/19/2022 Fluoroscopy was utilized by the requesting physician.  No radiographic interpretation.    Procedures Procedures    Medications Ordered in ED Medications  HYDROmorphone (DILAUDID) injection 1 mg (1 mg Intravenous Given 10/20/22 0456)    ED Course/ Medical Decision Making/ A&P                             Medical Decision Making Amount and/or Complexity of Data Reviewed Labs: ordered.  Risk Prescription drug management.   This patient is a 47 y.o. female who presents to the ED for concern of back pain after surgery, this involves an extensive number of treatment options, and is a complaint that carries with it a high risk of complications and morbidity. The emergent differential diagnosis prior to evaluation includes, but is not limited to,  normal post-op pain, epidural hematoma, post-op complication . This is not an exhaustive differential.   Past Medical History / Co-morbidities / Social History: Pt had a left L5-S1  microlaminectomy and discectomy with Dr. Zada Finders yesterday   Physical Exam: Physical exam performed. The pertinent findings include: TTP over incision site, no neurologic deficits. No erythema, warmth, or drainage from incision site.  Lab Tests: I ordered, and personally interpreted labs.  The pertinent results include:  WBC 14.2 likely due to recent surgery, K 3.1   Medications: I ordered medication including dilaudid  for pain. Reevaluation of the patient after these medicines showed that the patient improved. I have reviewed the patients home medicines and have made adjustments as needed.  Consultations Obtained: I requested consultation with the neurosurgery on call NP [facility]. ,  and  discussed lab and imaging findings as well as pertinent plan - they recommend: pain expected post op, given no neuro deficits, no indication for imaging. Recommend d/c with steroid taper and outpatient follow-up   Disposition:  Patient presents with complaints of back pain after back surgery yesterday. She is afebrile, non-toxic appearing, and in no acute distress with reassuring vital signs. No focal deficits on exam. Labs reassuring. Discussed with neurosurgery, likely normal post op pain. Plan for d/c with steroid taper per their recommendations. Evaluation and diagnostic testing in the emergency department does not suggest an emergent condition requiring admission or immediate intervention beyond what has been performed at this time.  Plan for discharge with close PCP and neurosurgery follow-up.  Patient is understanding and amenable with plan, educated on red flag symptoms that would prompt immediate return.  Patient discharged in stable condition.   Final Clinical Impression(s) / ED Diagnoses Final diagnoses:  Back pain with history of spinal surgery    Rx / DC Orders ED Discharge Orders          Ordered    methylPREDNISolone (MEDROL DOSEPAK) 4 MG TBPK tablet        10/20/22 [id number]           An After Visit Summary was printed and given to the patient.     Nestor Lewandowsky 10/20/22 [id number]    Fatima Blank, MD 10/20/22 [phone]

## 2022-10-20 NOTE — Discharge Instructions (Addendum)
As we discussed, your workup in the ER today was reassuring for acute findings.  Laboratory evaluation did not reveal any emergent concerns.  I did reach out to your neurosurgical team who suspects that this is postoperative pain that is expected after the type of surgery that you had. I have given you a steroid dose pack to help with this per neurosurgery's recommendations. I also recommend that you set yourself an alarm to take your pain meds as prescribed to ensure that you stay ahead of your pain. Follow-up with your neurosurgeon.  Return if development of any new or worsening symptoms

## 2023-04-14 ENCOUNTER — Other Ambulatory Visit: Payer: Self-pay | Admitting: Neurological Surgery

## 2023-04-20 NOTE — Pre-Procedure Instructions (Signed)
Surgical Instructions   Your procedure is scheduled on Tuesday, October 1st. Report to [facility] Main Entrance "A" at 05:30 A.M., then check in with the Admitting office. Any questions or running late day of surgery: call [phone]  Questions prior to your surgery date: call [phone], Monday-Friday, 8am-4pm. If you experience any cold or flu symptoms such as cough, fever, chills, shortness of breath, etc. between now and your scheduled surgery, please notify us at the above number.     Remember:  Do not eat or drink after midnight the night before your surgery    Take these medicines the morning of surgery with A SIP OF WATER  amLODipine (NORVASC)  gabapentin (NEURONTIN)  hydrALAZINE (APRESOLINE)  methylPREDNISolone (MEDROL DOSEPAK)  omeprazole (PRILOSEC)   May take these medicines IF NEEDED: cyclobenzaprine (FLEXERIL)  ondansetron (ZOFRAN)  oxyCODONE-acetaminophen (PERCOCET)    Follow your surgeon's instructions on when to stop Asprin.  If no instructions were given by your surgeon then you will need to call the office to get those instructions.     One week prior to surgery, STOP taking any Aleve, Naproxen, Ibuprofen, Motrin, Advil, Goody's, BC's, all herbal medications, fish oil, and non-prescription vitamins.  WHAT DO I DO ABOUT MY DIABETES MEDICATION?   Do not take metFORMIN (GLUCOPHAGE) the morning of surgery.    HOW TO MANAGE YOUR DIABETES BEFORE AND AFTER SURGERY  Why is it important to control my blood sugar before and after surgery? Improving blood sugar levels before and after surgery helps healing and can limit problems. A way of improving blood sugar control is eating a healthy diet by:  Eating less sugar and carbohydrates  Increasing activity/exercise  Talking with your doctor about reaching your blood sugar goals High blood sugars (greater than 180 mg/dL) can raise your risk of infections and slow your recovery, so you will need to focus on  controlling your diabetes during the weeks before surgery. Make sure that the doctor who takes care of your diabetes knows about your planned surgery including the date and location.  How do I manage my blood sugar before surgery? Check your blood sugar at least 4 times a day, starting 2 days before surgery, to make sure that the level is not too high or low.  Check your blood sugar the morning of your surgery when you wake up and every 2 hours until you get to the Short Stay unit.  If your blood sugar is less than 70 mg/dL, you will need to treat for low blood sugar: Do not take insulin. Treat a low blood sugar (less than 70 mg/dL) with  cup of clear juice (cranberry or apple), 4 glucose tablets, OR glucose gel. Recheck blood sugar in 15 minutes after treatment (to make sure it is greater than 70 mg/dL). If your blood sugar is not greater than 70 mg/dL on recheck, call [phone] for further instructions. Report your blood sugar to the short stay nurse when you get to Short Stay.  If you are admitted to the hospital after surgery: Your blood sugar will be checked by the staff and you will probably be given insulin after surgery (instead of oral diabetes medicines) to make sure you have good blood sugar levels. The goal for blood sugar control after surgery is 80-180 mg/dL.                     Do NOT Smoke (Tobacco/Vaping) for 24 hours prior to your procedure.  If you use a  CPAP at night, you may bring your mask/headgear for your overnight stay.   You will be asked to remove any contacts, glasses, piercing's, hearing aid's, dentures/partials prior to surgery. Please bring cases for these items if needed.    Patients discharged the day of surgery will not be allowed to drive home, and someone needs to stay with them for 24 hours.  SURGICAL WAITING ROOM VISITATION Patients may have no more than 2 support people in the waiting area - these visitors may rotate.   Pre-op nurse will  coordinate an appropriate time for 1 ADULT support person, who may not rotate, to accompany patient in pre-op.  Children under the age of 55 must have an adult with them who is not the patient and must remain in the main waiting area with an adult.  If the patient needs to stay at the hospital during part of their recovery, the visitor guidelines for inpatient rooms apply.  Please refer to the [facility] website for the visitor guidelines for any additional information.   If you received a COVID test during your pre-op visit  it is requested that you wear a mask when out in public, stay away from anyone that may not be feeling well and notify your surgeon if you develop symptoms. If you have been in contact with anyone that has tested positive in the last 10 days please notify you surgeon.      Pre-operative 5 CHG Bathing Instructions   You can play a key role in reducing the risk of infection after surgery. Your skin needs to be as free of germs as possible. You can reduce the number of germs on your skin by washing with CHG (chlorhexidine gluconate) soap before surgery. CHG is an antiseptic soap that kills germs and continues to kill germs even after washing.   DO NOT use if you have an allergy to chlorhexidine/CHG or antibacterial soaps. If your skin becomes reddened or irritated, stop using the CHG and notify one of our RNs at [phone].   Please shower with the CHG soap starting 4 days before surgery using the following schedule:     Please keep in mind the following:  DO NOT shave, including legs and underarms, starting the day of your first shower.   You may shave your face at any point before/day of surgery.  Place clean sheets on your bed the day you start using CHG soap. Use a clean washcloth (not used since being washed) for each shower. DO NOT sleep with pets once you start using the CHG.   CHG Shower Instructions:  Wash your face and private area with normal soap. If  you choose to wash your hair, wash first with your normal shampoo.  After you use shampoo/soap, rinse your hair and body thoroughly to remove shampoo/soap residue.  Turn the water OFF and apply about 3 tablespoons (45 ml) of CHG soap to a CLEAN washcloth.  Apply CHG soap ONLY FROM YOUR NECK DOWN TO YOUR TOES (washing for 3-5 minutes)  DO NOT use CHG soap on face, private areas, open wounds, or sores.  Pay special attention to the area where your surgery is being performed.  If you are having back surgery, having someone wash your back for you may be helpful. Wait 2 minutes after CHG soap is applied, then you may rinse off the CHG soap.  Pat dry with a clean towel  Put on clean clothes/pajamas   If you choose to wear lotion, please  use ONLY the CHG-compatible lotions on the back of this paper.   Additional instructions for the day of surgery: DO NOT APPLY any lotions, deodorants, cologne, or perfumes.   Do not bring valuables to the hospital. [facility] is not responsible for any belongings/valuables. Do not wear nail polish, gel polish, artificial nails, or any other type of covering on natural nails (fingers and toes) Do not wear jewelry or makeup Put on clean/comfortable clothes.  Please brush your teeth.  Ask your nurse before applying any prescription medications to the skin.     CHG Compatible Lotions   Aveeno Moisturizing lotion  Cetaphil Moisturizing Cream  Cetaphil Moisturizing Lotion  Clairol Herbal Essence Moisturizing Lotion, Dry Skin  Clairol Herbal Essence Moisturizing Lotion, Extra Dry Skin  Clairol Herbal Essence Moisturizing Lotion, Normal Skin  Curel Age Defying Therapeutic Moisturizing Lotion with Alpha Hydroxy  Curel Extreme Care Body Lotion  Curel Soothing Hands Moisturizing Hand Lotion  Curel Therapeutic Moisturizing Cream, Fragrance-Free  Curel Therapeutic Moisturizing Lotion, Fragrance-Free  Curel Therapeutic Moisturizing Lotion, Original Formula  Eucerin  Daily Replenishing Lotion  Eucerin Dry Skin Therapy Plus Alpha Hydroxy Crme  Eucerin Dry Skin Therapy Plus Alpha Hydroxy Lotion  Eucerin Original Crme  Eucerin Original Lotion  Eucerin Plus Crme Eucerin Plus Lotion  Eucerin TriLipid Replenishing Lotion  Keri Anti-Bacterial Hand Lotion  Keri Deep Conditioning Original Lotion Dry Skin Formula Softly Scented  Keri Deep Conditioning Original Lotion, Fragrance Free Sensitive Skin Formula  Keri Lotion Fast Absorbing Fragrance Free Sensitive Skin Formula  Keri Lotion Fast Absorbing Softly Scented Dry Skin Formula  Keri Original Lotion  Keri Skin Renewal Lotion Keri Silky Smooth Lotion  Keri Silky Smooth Sensitive Skin Lotion  Nivea Body Creamy Conditioning Oil  Nivea Body Extra Enriched Lotion  Nivea Body Original Lotion  Nivea Body Sheer Moisturizing Lotion Nivea Crme  Nivea Skin Firming Lotion  NutraDerm 30 Skin Lotion  NutraDerm Skin Lotion  NutraDerm Therapeutic Skin Cream  NutraDerm Therapeutic Skin Lotion  ProShield Protective Hand Cream  Provon moisturizing lotion  Please read over the following fact sheets that you were given.

## 2023-04-21 ENCOUNTER — Encounter (HOSPITAL_COMMUNITY)
Admission: RE | Admit: 2023-04-21 | Discharge: 2023-04-21 | Disposition: A | Discharge status: Home / Self Care | Payer: Medicaid Other | Source: Ambulatory Visit | Attending: Neurological Surgery | Admitting: Neurological Surgery

## 2023-04-21 ENCOUNTER — Encounter (HOSPITAL_COMMUNITY): Payer: Self-pay

## 2023-04-21 ENCOUNTER — Other Ambulatory Visit: Payer: Self-pay

## 2023-04-21 VITALS — BP 159/107 | HR 102 | Temp 99.2°F | Resp 18 | Ht 66.0 in | Wt 203.0 lb

## 2023-04-21 DIAGNOSIS — Z01812 Encounter for preprocedural laboratory examination: Secondary | ICD-10-CM | POA: Insufficient documentation

## 2023-04-21 DIAGNOSIS — Z01818 Encounter for other preprocedural examination: Secondary | ICD-10-CM

## 2023-04-21 LAB — CBC
HCT: 39 % (ref 36.0–46.0)
Hemoglobin: 12.4 g/dL (ref 12.0–15.0)
MCH: 31.5 pg (ref 26.0–34.0)
MCHC: 31.8 g/dL (ref 30.0–36.0)
MCV: 99 fL (ref 80.0–100.0)
Platelets: 327 10*3/uL (ref 150–400)
RBC: 3.94 MIL/uL (ref 3.87–5.11)
RDW: 12.6 % (ref 11.5–15.5)
WBC: 9.1 10*3/uL (ref 4.0–10.5)
nRBC: 0 % (ref 0.0–0.2)

## 2023-04-21 LAB — BASIC METABOLIC PANEL
Anion gap: 9 (ref 5–15)
BUN: 9 mg/dL (ref 6–20)
CO2: 21 mmol/L — ABNORMAL LOW (ref 22–32)
Calcium: 9.1 mg/dL (ref 8.9–10.3)
Chloride: 108 mmol/L (ref 98–111)
Creatinine, Ser: 0.94 mg/dL (ref 0.44–1.00)
GFR, Estimated: >60 mL/min (ref >60)
Glucose, Bld: 129 mg/dL — ABNORMAL HIGH (ref 70–99)
Potassium: 3 mmol/L — ABNORMAL LOW (ref 3.5–5.1)
Sodium: 138 mmol/L (ref 135–145)

## 2023-04-21 LAB — SURGICAL PCR SCREEN
MRSA, PCR: NEGATIVE
Staphylococcus aureus: NEGATIVE

## 2023-04-21 LAB — HEMOGLOBIN A1C
Hgb A1c MFr Bld: 5.6 % (ref 4.8–5.6)
Mean Plasma Glucose: 114.02 mg/dL

## 2023-04-21 LAB — GLUCOSE, CAPILLARY: Glucose-Capillary: 132 mg/dL — ABNORMAL HIGH (ref 70–99)

## 2023-04-21 NOTE — Progress Notes (Signed)
PCP - Dr. Earlie Lou Cardiologist -   PPM/ICD - denies Device Orders - n/a Rep Notified - n/a  Chest x-ray - n/a EKG - 06/26/2022 Stress Test -  ECHO -  Cardiac Cath -   Sleep Study - denies CPAP - denies  Type II diabetic.  Fasting Blood Sugar - 107-120 Checks Blood Sugar monthly  Hold Metformin the day of surgery  Last dose of GLP1 agonist-  denies GLP1 instructions: denies  Blood Thinner Instructions: denies Aspirin Instructions:states last dose 9/22/20224  ERAS Protcol -NPO  COVID TEST- n/a  Anesthesia review: Yes. HTN, DM  Patient denies shortness of breath, fever, cough and chest pain at PAT appointment   All instructions explained to the patient, with a verbal understanding of the material. Patient agrees to go over the instructions while at home for a better understanding. Patient also instructed to self quarantine after being tested for COVID-19. The opportunity to ask questions was provided.

## 2023-04-27 ENCOUNTER — Other Ambulatory Visit: Payer: Self-pay

## 2023-04-27 ENCOUNTER — Ambulatory Visit (HOSPITAL_BASED_OUTPATIENT_CLINIC_OR_DEPARTMENT_OTHER): Payer: Medicaid Other | Admitting: Anesthesiology

## 2023-04-27 ENCOUNTER — Ambulatory Visit (HOSPITAL_COMMUNITY): Payer: Medicaid Other

## 2023-04-27 ENCOUNTER — Observation Stay (HOSPITAL_COMMUNITY)
Admission: RE | Admit: 2023-04-27 | Discharge: 2023-04-28 | Disposition: A | Discharge status: Home / Self Care | Payer: Medicaid Other | Attending: Neurological Surgery | Admitting: Neurological Surgery

## 2023-04-27 ENCOUNTER — Ambulatory Visit (HOSPITAL_COMMUNITY): Payer: Medicaid Other | Admitting: Physician Assistant

## 2023-04-27 ENCOUNTER — Encounter (HOSPITAL_COMMUNITY)
Admission: RE | Disposition: A | Discharge status: Home / Self Care | Payer: Self-pay | Source: Home / Self Care | Attending: Neurological Surgery

## 2023-04-27 DIAGNOSIS — Z01818 Encounter for other preprocedural examination: Secondary | ICD-10-CM

## 2023-04-27 DIAGNOSIS — M5117 Intervertebral disc disorders with radiculopathy, lumbosacral region: Secondary | ICD-10-CM | POA: Diagnosis present

## 2023-04-27 DIAGNOSIS — F17210 Nicotine dependence, cigarettes, uncomplicated: Secondary | ICD-10-CM | POA: Insufficient documentation

## 2023-04-27 DIAGNOSIS — I10 Essential (primary) hypertension: Secondary | ICD-10-CM | POA: Insufficient documentation

## 2023-04-27 DIAGNOSIS — M5126 Other intervertebral disc displacement, lumbar region: Secondary | ICD-10-CM

## 2023-04-27 DIAGNOSIS — M5116 Intervertebral disc disorders with radiculopathy, lumbar region: Principal | ICD-10-CM | POA: Diagnosis present

## 2023-04-27 DIAGNOSIS — E119 Type 2 diabetes mellitus without complications: Secondary | ICD-10-CM | POA: Diagnosis not present

## 2023-04-27 HISTORY — PX: LUMBAR LAMINECTOMY/ DECOMPRESSION WITH MET-RX: SHX5959

## 2023-04-27 LAB — GLUCOSE, CAPILLARY
Glucose-Capillary: 118 mg/dL — ABNORMAL HIGH (ref 70–99)
Glucose-Capillary: 167 mg/dL — ABNORMAL HIGH (ref 70–99)
Glucose-Capillary: 145 mg/dL — ABNORMAL HIGH (ref 70–99)
Glucose-Capillary: 205 mg/dL — ABNORMAL HIGH (ref 70–99)
Glucose-Capillary: 293 mg/dL — ABNORMAL HIGH (ref 70–99)

## 2023-04-27 LAB — POCT PREGNANCY, URINE: Preg Test, Ur: NEGATIVE

## 2023-04-27 SURGERY — LUMBAR LAMINECTOMY/ DECOMPRESSION WITH MET-RX
Anesthesia: General | Site: Back | Laterality: Left

## 2023-04-27 MED ORDER — THROMBIN 5000 UNITS EX SOLR: OROMUCOSAL | Status: DC | PRN

## 2023-04-27 MED ORDER — ACETAMINOPHEN 650 MG RE SUPP: 650.0000 mg | RECTAL | Status: DC | PRN

## 2023-04-27 MED ORDER — LIDOCAINE 2% (20 MG/ML) 5 ML SYRINGE
INTRAMUSCULAR | Status: AC
  Filled 2023-04-27: qty 5

## 2023-04-27 MED ORDER — PHENOL 1.4 % MT LIQD: 1.0000 | OROMUCOSAL | Status: DC | PRN

## 2023-04-27 MED ORDER — GABAPENTIN 300 MG PO CAPS
300.0000 mg | ORAL_CAPSULE | Freq: Three times a day (TID) | ORAL | Status: DC
  Administered 2023-04-27 – 2023-04-28 (×3): 300 mg via ORAL
  Filled 2023-04-27 (×3): qty 1

## 2023-04-27 MED ORDER — INSULIN ASPART 100 UNIT/ML IJ SOLN
0.0000 [IU] | Freq: Every day | INTRAMUSCULAR | Status: DC
  Administered 2023-04-27: 3 [IU] via SUBCUTANEOUS

## 2023-04-27 MED ORDER — SODIUM CHLORIDE 0.9 % IV SOLN
250.0000 mL | INTRAVENOUS | Status: DC
  Administered 2023-04-27: 250 mL via INTRAVENOUS

## 2023-04-27 MED ORDER — SUGAMMADEX SODIUM 200 MG/2ML IV SOLN: INTRAVENOUS | Status: DC | PRN

## 2023-04-27 MED ORDER — ONDANSETRON HCL 4 MG/2ML IJ SOLN
4.0000 mg | Freq: Four times a day (QID) | INTRAMUSCULAR | Status: DC | PRN
  Administered 2023-04-27: 4 mg via INTRAVENOUS
  Filled 2023-04-27: qty 2

## 2023-04-27 MED ORDER — INSULIN ASPART 100 UNIT/ML IJ SOLN
0.0000 [IU] | Freq: Three times a day (TID) | INTRAMUSCULAR | Status: DC
  Administered 2023-04-28 (×2): 3 [IU] via SUBCUTANEOUS

## 2023-04-27 MED ORDER — CHLORHEXIDINE GLUCONATE 0.12 % MT SOLN
15.0000 mL | Freq: Once | OROMUCOSAL | Status: AC
  Administered 2023-04-27: 15 mL via OROMUCOSAL
  Filled 2023-04-27: qty 15

## 2023-04-27 MED ORDER — HYDROMORPHONE HCL 1 MG/ML IJ SOLN
INTRAMUSCULAR | Status: AC
  Filled 2023-04-27: qty 0.5

## 2023-04-27 MED ORDER — ESMOLOL HCL 100 MG/10ML IV SOLN
INTRAVENOUS | Status: DC | PRN
Start: 2023-04-27 — End: 2023-04-27
  Administered 2023-04-27: 10 mg via INTRAVENOUS

## 2023-04-27 MED ORDER — ONDANSETRON HCL 4 MG/2ML IJ SOLN
INTRAMUSCULAR | Status: AC
  Filled 2023-04-27: qty 2

## 2023-04-27 MED ORDER — CEFAZOLIN SODIUM-DEXTROSE 2-4 GM/100ML-% IV SOLN
2.0000 g | Freq: Three times a day (TID) | INTRAVENOUS | Status: AC
  Administered 2023-04-27 – 2023-04-28 (×2): 2 g via INTRAVENOUS
  Filled 2023-04-27 (×2): qty 100

## 2023-04-27 MED ORDER — ACETAMINOPHEN 10 MG/ML IV SOLN
INTRAVENOUS | Status: DC | PRN
Start: 2023-04-27 — End: 2023-04-27
  Administered 2023-04-27: 1000 mg via INTRAVENOUS

## 2023-04-27 MED ORDER — ONDANSETRON HCL 4 MG/2ML IJ SOLN
INTRAMUSCULAR | Status: DC | PRN
  Administered 2023-04-27 (×2): 4 mg via INTRAVENOUS

## 2023-04-27 MED ORDER — PHENYLEPHRINE HCL-NACL 20-0.9 MG/250ML-% IV SOLN
INTRAVENOUS | Status: DC | PRN
  Administered 2023-04-27: 30 ug/min via INTRAVENOUS

## 2023-04-27 MED ORDER — OXYCODONE HCL 5 MG/5ML PO SOLN: 5.0000 mg | Freq: Once | ORAL | Status: DC | PRN

## 2023-04-27 MED ORDER — DEXAMETHASONE SODIUM PHOSPHATE 10 MG/ML IJ SOLN
8.0000 mg | Freq: Three times a day (TID) | INTRAMUSCULAR | Status: AC
  Administered 2023-04-27 – 2023-04-28 (×3): 8 mg via INTRAVENOUS
  Filled 2023-04-27 (×2): qty 1

## 2023-04-27 MED ORDER — SODIUM CHLORIDE 0.9% FLUSH: 3.0000 mL | INTRAVENOUS | Status: DC | PRN

## 2023-04-27 MED ORDER — ONDANSETRON HCL 4 MG PO TABS: 4.0000 mg | ORAL_TABLET | Freq: Four times a day (QID) | ORAL | Status: DC | PRN

## 2023-04-27 MED ORDER — POLYETHYLENE GLYCOL 3350 17 G PO PACK: 17.0000 g | PACK | Freq: Every day | ORAL | Status: DC | PRN

## 2023-04-27 MED ORDER — ACETAMINOPHEN 10 MG/ML IV SOLN: 1000.0000 mg | Freq: Once | INTRAVENOUS | Status: DC | PRN

## 2023-04-27 MED ORDER — OXYCODONE HCL 5 MG PO TABS
10.0000 mg | ORAL_TABLET | ORAL | Status: DC | PRN
  Administered 2023-04-27 – 2023-04-28 (×6): 10 mg via ORAL
  Filled 2023-04-27 (×6): qty 2

## 2023-04-27 MED ORDER — PROPOFOL 10 MG/ML IV BOLUS
INTRAVENOUS | Status: DC | PRN
  Administered 2023-04-27: 150 mg via INTRAVENOUS

## 2023-04-27 MED ORDER — KETAMINE HCL 10 MG/ML IJ SOLN
INTRAMUSCULAR | Status: DC | PRN
Start: 2023-04-27 — End: 2023-04-27
  Administered 2023-04-27: 25 mg via INTRAVENOUS

## 2023-04-27 MED ORDER — ROCURONIUM BROMIDE 10 MG/ML (PF) SYRINGE
PREFILLED_SYRINGE | INTRAVENOUS | Status: DC | PRN
  Administered 2023-04-27: 15 mg via INTRAVENOUS
  Administered 2023-04-27: 70 mg via INTRAVENOUS

## 2023-04-27 MED ORDER — HYDROMORPHONE HCL 1 MG/ML IJ SOLN
INTRAMUSCULAR | Status: DC | PRN
Start: 2023-04-27 — End: 2023-04-27
  Administered 2023-04-27: .5 mg via INTRAVENOUS

## 2023-04-27 MED ORDER — HYDROMORPHONE HCL 1 MG/ML IJ SOLN
1.0000 mg | INTRAMUSCULAR | Status: DC | PRN
  Administered 2023-04-27 (×2): 1 mg via INTRAVENOUS
  Filled 2023-04-27: qty 1

## 2023-04-27 MED ORDER — PHENYLEPHRINE 80 MCG/ML (10ML) SYRINGE FOR IV PUSH (FOR BLOOD PRESSURE SUPPORT)
PREFILLED_SYRINGE | INTRAVENOUS | Status: AC
  Filled 2023-04-27: qty 10

## 2023-04-27 MED ORDER — 0.9 % SODIUM CHLORIDE (POUR BTL) OPTIME
TOPICAL | Status: DC | PRN
  Administered 2023-04-27: 1000 mL

## 2023-04-27 MED ORDER — PANTOPRAZOLE SODIUM 40 MG PO TBEC: 40.0000 mg | DELAYED_RELEASE_TABLET | Freq: Every day | ORAL | Status: DC

## 2023-04-27 MED ORDER — METHOCARBAMOL 1000 MG/10ML IJ SOLN
500.0000 mg | Freq: Once | INTRAVENOUS | Status: AC
  Administered 2023-04-27: 500 mg via INTRAVENOUS
  Filled 2023-04-27: qty 500

## 2023-04-27 MED ORDER — ACETAMINOPHEN 325 MG PO TABS: 650.0000 mg | ORAL_TABLET | ORAL | Status: DC | PRN

## 2023-04-27 MED ORDER — METFORMIN HCL 500 MG PO TABS: 500.0000 mg | ORAL_TABLET | Freq: Two times a day (BID) | ORAL | Status: DC

## 2023-04-27 MED ORDER — SUGAMMADEX SODIUM 200 MG/2ML IV SOLN
INTRAVENOUS | Status: DC | PRN
  Administered 2023-04-27: 200 mg via INTRAVENOUS

## 2023-04-27 MED ORDER — DOCUSATE SODIUM 100 MG PO CAPS
100.0000 mg | ORAL_CAPSULE | Freq: Two times a day (BID) | ORAL | Status: DC
  Administered 2023-04-27 – 2023-04-28 (×3): 100 mg via ORAL
  Filled 2023-04-27 (×3): qty 1

## 2023-04-27 MED ORDER — ROCURONIUM BROMIDE 10 MG/ML (PF) SYRINGE
PREFILLED_SYRINGE | INTRAVENOUS | Status: AC
  Filled 2023-04-27: qty 10

## 2023-04-27 MED ORDER — FENTANYL CITRATE (PF) 100 MCG/2ML IJ SOLN
25.0000 ug | INTRAMUSCULAR | Status: DC | PRN
  Administered 2023-04-27 (×2): 50 ug via INTRAVENOUS
  Administered 2023-04-27: 100 ug via INTRAVENOUS

## 2023-04-27 MED ORDER — DEXAMETHASONE SODIUM PHOSPHATE 10 MG/ML IJ SOLN
INTRAMUSCULAR | Status: AC
  Filled 2023-04-27: qty 1

## 2023-04-27 MED ORDER — LIDOCAINE-EPINEPHRINE 1 %-1:100000 IJ SOLN
INTRAMUSCULAR | Status: DC | PRN
  Administered 2023-04-27: 10 mL

## 2023-04-27 MED ORDER — AMLODIPINE BESYLATE 10 MG PO TABS
10.0000 mg | ORAL_TABLET | Freq: Every day | ORAL | Status: DC
  Administered 2023-04-28: 10 mg via ORAL
  Filled 2023-04-27: qty 1

## 2023-04-27 MED ORDER — OXYCODONE HCL 5 MG PO TABS: 5.0000 mg | ORAL_TABLET | ORAL | Status: DC | PRN

## 2023-04-27 MED ORDER — MENTHOL 3 MG MT LOZG: 1.0000 | LOZENGE | OROMUCOSAL | Status: DC | PRN

## 2023-04-27 MED ORDER — INSULIN ASPART 100 UNIT/ML IJ SOLN: 0.0000 [IU] | INTRAMUSCULAR | Status: DC | PRN

## 2023-04-27 MED ORDER — FENTANYL CITRATE (PF) 100 MCG/2ML IJ SOLN
INTRAMUSCULAR | Status: AC
  Filled 2023-04-27: qty 2

## 2023-04-27 MED ORDER — MIDAZOLAM HCL 2 MG/2ML IJ SOLN
INTRAMUSCULAR | Status: DC | PRN
  Administered 2023-04-27: 2 mg via INTRAVENOUS

## 2023-04-27 MED ORDER — CHLORHEXIDINE GLUCONATE CLOTH 2 % EX PADS: 6.0000 | MEDICATED_PAD | Freq: Once | CUTANEOUS | Status: DC

## 2023-04-27 MED ORDER — PHENYLEPHRINE 80 MCG/ML (10ML) SYRINGE FOR IV PUSH (FOR BLOOD PRESSURE SUPPORT)
PREFILLED_SYRINGE | INTRAVENOUS | Status: DC | PRN
  Administered 2023-04-27 (×3): 160 ug via INTRAVENOUS

## 2023-04-27 MED ORDER — HYDROMORPHONE HCL 1 MG/ML IJ SOLN
INTRAMUSCULAR | Status: AC
  Filled 2023-04-27: qty 1

## 2023-04-27 MED ORDER — CYCLOBENZAPRINE HCL 10 MG PO TABS
10.0000 mg | ORAL_TABLET | Freq: Three times a day (TID) | ORAL | Status: DC | PRN
  Administered 2023-04-27 – 2023-04-28 (×3): 10 mg via ORAL
  Filled 2023-04-27 (×3): qty 1

## 2023-04-27 MED ORDER — EPHEDRINE 5 MG/ML INJ
INTRAVENOUS | Status: AC
  Filled 2023-04-27: qty 5

## 2023-04-27 MED ORDER — DEXAMETHASONE SODIUM PHOSPHATE 10 MG/ML IJ SOLN
INTRAMUSCULAR | Status: DC | PRN
  Administered 2023-04-27: 10 mg via INTRAVENOUS

## 2023-04-27 MED ORDER — LIDOCAINE 2% (20 MG/ML) 5 ML SYRINGE
INTRAMUSCULAR | Status: DC | PRN
  Administered 2023-04-27: 100 mg via INTRAVENOUS

## 2023-04-27 MED ORDER — LACTATED RINGERS IV SOLN: INTRAVENOUS | Status: DC

## 2023-04-27 MED ORDER — MIDAZOLAM HCL 2 MG/2ML IJ SOLN
INTRAMUSCULAR | Status: AC
  Filled 2023-04-27: qty 2

## 2023-04-27 MED ORDER — HYDROMORPHONE HCL 1 MG/ML IJ SOLN
0.5000 mg | INTRAMUSCULAR | Status: AC | PRN
  Administered 2023-04-27 (×4): 0.5 mg via INTRAVENOUS

## 2023-04-27 MED ORDER — CEFAZOLIN SODIUM-DEXTROSE 2-4 GM/100ML-% IV SOLN
2.0000 g | INTRAVENOUS | Status: AC
  Administered 2023-04-27: 2 g via INTRAVENOUS
  Filled 2023-04-27: qty 100

## 2023-04-27 MED ORDER — HYDRALAZINE HCL 10 MG PO TABS
10.0000 mg | ORAL_TABLET | Freq: Two times a day (BID) | ORAL | Status: DC
  Administered 2023-04-27 – 2023-04-28 (×2): 10 mg via ORAL
  Filled 2023-04-27 (×3): qty 1

## 2023-04-27 MED ORDER — ONDANSETRON HCL 4 MG/2ML IJ SOLN: 4.0000 mg | Freq: Once | INTRAMUSCULAR | Status: DC | PRN

## 2023-04-27 MED ORDER — ORAL CARE MOUTH RINSE: 15.0000 mL | Freq: Once | OROMUCOSAL | Status: AC

## 2023-04-27 MED ORDER — ACETAMINOPHEN 10 MG/ML IV SOLN
INTRAVENOUS | Status: AC
  Filled 2023-04-27: qty 100

## 2023-04-27 MED ORDER — PROPOFOL 10 MG/ML IV BOLUS
INTRAVENOUS | Status: AC
  Filled 2023-04-27: qty 20

## 2023-04-27 MED ORDER — LIDOCAINE-EPINEPHRINE 1 %-1:100000 IJ SOLN
INTRAMUSCULAR | Status: AC
  Filled 2023-04-27: qty 1

## 2023-04-27 MED ORDER — OXYCODONE HCL 5 MG PO TABS: 5.0000 mg | ORAL_TABLET | Freq: Once | ORAL | Status: DC | PRN

## 2023-04-27 MED ORDER — SODIUM CHLORIDE 0.9% FLUSH
3.0000 mL | Freq: Two times a day (BID) | INTRAVENOUS | Status: DC
  Administered 2023-04-27 – 2023-04-28 (×2): 3 mL via INTRAVENOUS

## 2023-04-27 MED ORDER — KETAMINE HCL 50 MG/5ML IJ SOSY
PREFILLED_SYRINGE | INTRAMUSCULAR | Status: AC
  Filled 2023-04-27: qty 5

## 2023-04-27 MED ORDER — THROMBIN 5000 UNITS EX SOLR
CUTANEOUS | Status: AC
  Filled 2023-04-27: qty 5000

## 2023-04-27 SURGICAL SUPPLY — 52 items
ADH SKN CLS APL DERMABOND .7 (GAUZE/BANDAGES/DRESSINGS) ×1
BAG COUNTER SPONGE SURGICOUNT (BAG) ×2 IMPLANT
BAG SPNG CNTER NS LX DISP (BAG) ×1
BLADE CLIPPER SURG (BLADE) IMPLANT
BLADE SURG 11 STRL SS (BLADE) ×2 IMPLANT
BUR SURG IBUR 4X12.5 (BURR) IMPLANT
BUR SURGICAL HEAD PROX 3X12.5 (BURR) ×2 IMPLANT
BURR SURG IBUR 4X12.5 (BURR)
BURR SURGICAL HEAD PROX 3X12.5 (BURR) ×1
CANISTER SUCT 3000ML PPV (MISCELLANEOUS) ×2 IMPLANT
DERMABOND ADVANCED .7 DNX12 (GAUZE/BANDAGES/DRESSINGS) ×2 IMPLANT
DRAPE C-ARM 42X72 X-RAY (DRAPES) ×4 IMPLANT
DRAPE LAPAROTOMY 100X72X124 (DRAPES) ×2 IMPLANT
DRAPE MICROSCOPE SLANT 54X150 (MISCELLANEOUS) ×2 IMPLANT
DRAPE SURG 17X23 STRL (DRAPES) ×2 IMPLANT
DURAPREP 26ML APPLICATOR (WOUND CARE) ×2 IMPLANT
ELECT BLADE 6.5 EXT (BLADE) ×2 IMPLANT
ELECT REM PT RETURN 9FT ADLT (ELECTROSURGICAL) ×1
ELECTRODE REM PT RTRN 9FT ADLT (ELECTROSURGICAL) ×2 IMPLANT
GAUZE 4X4 16PLY ~~LOC~~+RFID DBL (SPONGE) IMPLANT
GAUZE SPONGE 4X4 12PLY STRL (GAUZE/BANDAGES/DRESSINGS) IMPLANT
GLOVE BIO SURGEON STRL SZ7.5 (GLOVE) ×2 IMPLANT
GLOVE BIOGEL PI IND STRL 7.0 (GLOVE) ×2 IMPLANT
GLOVE BIOGEL PI IND STRL 7.5 (GLOVE) ×2 IMPLANT
GLOVE ECLIPSE 7.5 STRL STRAW (GLOVE) ×2 IMPLANT
GLOVE EXAM NITRILE LRG STRL (GLOVE) IMPLANT
GLOVE EXAM NITRILE XL STR (GLOVE) IMPLANT
GLOVE EXAM NITRILE XS STR PU (GLOVE) IMPLANT
GOWN STRL REUS W/ TWL LRG LVL3 (GOWN DISPOSABLE) ×4 IMPLANT
GOWN STRL REUS W/ TWL XL LVL3 (GOWN DISPOSABLE) IMPLANT
GOWN STRL REUS W/TWL 2XL LVL3 (GOWN DISPOSABLE) IMPLANT
GOWN STRL REUS W/TWL LRG LVL3 (GOWN DISPOSABLE) ×2
GOWN STRL REUS W/TWL XL LVL3 (GOWN DISPOSABLE)
HEMOSTAT POWDER KIT SURGIFOAM (HEMOSTASIS) ×2 IMPLANT
KIT BASIN OR (CUSTOM PROCEDURE TRAY) ×2 IMPLANT
KIT TURNOVER KIT B (KITS) ×2 IMPLANT
NDL HYPO 22X1.5 SAFETY MO (MISCELLANEOUS) ×2 IMPLANT
NDL SPNL 18GX3.5 QUINCKE PK (NEEDLE) ×2 IMPLANT
NEEDLE HYPO 22X1.5 SAFETY MO (MISCELLANEOUS) ×1 IMPLANT
NEEDLE SPNL 18GX3.5 QUINCKE PK (NEEDLE) ×1 IMPLANT
NS IRRIG 1000ML POUR BTL (IV SOLUTION) ×2 IMPLANT
PACK LAMINECTOMY NEURO (CUSTOM PROCEDURE TRAY) ×2 IMPLANT
PAD ARMBOARD 7.5X6 YLW CONV (MISCELLANEOUS) ×6 IMPLANT
SPIKE FLUID TRANSFER (MISCELLANEOUS) ×2 IMPLANT
SPONGE T-LAP 4X18 ~~LOC~~+RFID (SPONGE) IMPLANT
SUT MNCRL AB 3-0 PS2 18 (SUTURE) IMPLANT
SUT VIC AB 0 CT1 18XCR BRD8 (SUTURE) IMPLANT
SUT VIC AB 0 CT1 8-18 (SUTURE)
SUT VIC AB 2-0 CP2 18 (SUTURE) ×2 IMPLANT
TOWEL GREEN STERILE (TOWEL DISPOSABLE) ×2 IMPLANT
TOWEL GREEN STERILE FF (TOWEL DISPOSABLE) ×2 IMPLANT
WATER STERILE IRR 1000ML POUR (IV SOLUTION) ×2 IMPLANT

## 2023-04-27 NOTE — Anesthesia Preprocedure Evaluation (Signed)
Anesthesia Evaluation  Patient identified by MRN, date of birth, ID band Patient awake    Reviewed: Allergy & Precautions, NPO status , Patient's Chart, lab work & pertinent test results, reviewed documented beta blocker date and time   History of Anesthesia Complications Negative for: history of anesthetic complications  Airway Mallampati: III  TM Distance: >3 FB Neck ROM: Full    Dental no notable dental hx.    Pulmonary neg shortness of breath, neg COPD, Current Smoker and Patient abstained from smoking.   breath sounds clear to auscultation       Cardiovascular hypertension, (-) angina (-) CAD, (-) Past MI, (-) Cardiac Stents, (-) CABG and (-) Peripheral Vascular Disease (-) dysrhythmias (-) pacemaker Rhythm:Regular Rate:Normal     Neuro/Psych  Headaches, neg Seizures  Neuromuscular disease    GI/Hepatic ,GERD  ,,(+) neg Cirrhosis        Endo/Other  diabetes    Renal/GU Renal disease     Musculoskeletal   Abdominal   Peds  Hematology  (+) Blood dyscrasia, anemia   Anesthesia Other Findings   Reproductive/Obstetrics                              Anesthesia Physical Anesthesia Plan  ASA: 2  Anesthesia Plan: General   Post-op Pain Management:    Induction: Intravenous  PONV Risk Score and Plan: 2  Airway Management Planned: Oral ETT  Additional Equipment:   Intra-op Plan:   Post-operative Plan: Extubation in OR  Informed Consent: I have reviewed the patients History and Physical, chart, labs and discussed the procedure including the risks, benefits and alternatives for the proposed anesthesia with the patient or authorized representative who has indicated his/her understanding and acceptance.     Dental advisory given  Plan Discussed with: CRNA  Anesthesia Plan Comments:          Anesthesia Quick Evaluation

## 2023-04-27 NOTE — Op Note (Signed)
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 She had recurrence of her LLE Sx after a fall, repeat MRI showed a very large recurrent disc herniation and and nerve root compression. I therefore recommended a repeat minimally invasive L5-S1 discectomy. This was discussed with the patient as well as risks, benefits, and alternatives and the patient wished to proceed with surgical treatment.    OPERATIVE DETAIL: The patient was taken to the operating room and placed on the OR table in the prone position. A formal time out was performed with two patient identifiers and confirmed the operative site. Anesthesia was induced by the anesthesia team. The operative site was marked, hair was clipped with surgical clippers, the area was then prepped and draped in a sterile fashion. Fluoroscopy was used to identify the surgical level prior to incision.   The patient's prior left paramedian incision was used. The fascia was incised sharply and serial dilators were docked to the lamino-facet junction, with care to avoid the prior laminar defect, using fluoroscopy to confirm position as well as perform a second count to confirm the correct surgical level. After a final dilator was placed, a tubular retractor was placed over this and secured to the table. The operating microscope was draped and brought into the field. Anatomy was palpated and confirmed, the facet and lamina were exposed. The existing laminotomy defect was identified and carefully dissected free from scar tissue. It was quite adherent to the dura and  carefully mobilized without issue.   The thecal sac had significant mass effect on it from the disc and was obviously tented dorsally. I created a discectomy defect to allow for reduction of disc material into the defect. I was able to dissect ventrally and develop a plane to start removing significant disc material from the lateral recess, then superiorly up to the foramen, then the central portion tracking medially / contralaterally. Good decompression was obtained.   Suzanne Consentino PA was scrubbed and assisted with the entire procedure which included exposure, discectomy and closure.  Hemostasis was obtained, the wound was copiously irrigated, and the tube was removed while using the microscope to confirm hemostasis of the muscle edges. All instrument and sponge counts were correct and the incision was then closed in layers. The patient was then returned to anesthesia for emergence. No apparent complications at the completion of the procedure.    EBL:  40mL   DRAINS: none   SPECIMENS: none   Jadene Pierini, MD

## 2023-04-27 NOTE — Anesthesia Procedure Notes (Signed)
Procedure Name: Intubation Date/Time: 04/27/2023 7:51 AM  Performed by: Earlene Plater, CRNAPre-anesthesia Checklist: Patient identified, Emergency Drugs available, Suction available, Patient being monitored and Timeout performed Patient Re-evaluated:Patient Re-evaluated prior to induction Oxygen Delivery Method: Circle system utilized Preoxygenation: Pre-oxygenation with 100% oxygen Induction Type: IV induction Ventilation: Mask ventilation without difficulty Laryngoscope Size: Mac and 4 Grade View: Grade II Tube type: Oral Tube size: 7.0 mm Airway Equipment and Method: Stylet and Bite block Placement Confirmation: ETT inserted through vocal cords under direct vision, positive ETCO2, breath sounds checked- equal and bilateral and CO2 detector Secured at: 22 (@ lips) cm Tube secured with: Tape Dental Injury: Teeth and Oropharynx as per pre-operative assessment

## 2023-04-27 NOTE — Anesthesia Postprocedure Evaluation (Signed)
Anesthesia Post Note  Patient: Suzanne [name]  Procedure(s) Performed: Revision Left Minimally Invasive Lumbar Five-Sacral One Discectomy (Left: Back)     Patient location during evaluation: PACU Anesthesia Type: General Level of consciousness: awake and alert Pain management: pain level controlled Vital Signs Assessment: post-procedure vital signs reviewed and stable Respiratory status: spontaneous breathing, nonlabored ventilation, respiratory function stable and patient connected to nasal cannula oxygen Cardiovascular status: blood pressure returned to baseline and stable Postop Assessment: no apparent nausea or vomiting Anesthetic complications: no   No notable events documented.  Last Vitals:  Vitals:   04/27/23 1130 04/27/23 1235  BP: 135/84 127/76  Pulse: 86 83  Resp: 16 18  Temp:  36.8 C  SpO2: 95% 99%    Last Pain:  Vitals:   04/27/23 1315  TempSrc:   PainSc: 9                  Mariann Barter

## 2023-04-27 NOTE — H&P (Signed)
Surgical H&P Update  HPI: 47 y.o. with a history of prior MIS discectomy with resolution of Sx, then recurrence of LLE radicular Sx after a fall. Workup showed a recurrent L 5-1 HNP. No changes in health since they were last seen. Still having the above and wishes to proceed with surgery.  PMHx:  Past Medical History:  Diagnosis Date   Anemia    Diabetes mellitus without complication (HCC)    GERD (gastroesophageal reflux disease)    Headache    History of kidney stones    Hypertension    Sciatica    FamHx:  Family History  Problem Relation Age of Onset   Diabetes Mother    Hypertension Mother    GER disease Mother    SocHx:  reports that she has been smoking cigarettes. She has a 3.8 pack-year smoking history. She has never used smokeless tobacco. She reports current drug use. Frequency: 1.00 time per week. Drug: Marijuana. She reports that she does not drink alcohol.  Physical Exam: Strength 5/5 x4 and SILTx4 except L S1 numbness  Assesment/Plan: 47 y.o. woman with recurrent left L5-S1 HNP, here for repeat L L5-S1 MIS discectomy. Risks, benefits, and alternatives discussed and the patient would like to continue with surgery.  -OR today -3C post-op  Jadene Pierini, MD 04/27/23 7:22 AM

## 2023-04-27 NOTE — Transfer of Care (Signed)
Immediate Anesthesia Transfer of Care Note  Patient: Suzanne [name]  Procedure(s) Performed: Revision Left Minimally Invasive Lumbar Five-Sacral One Discectomy (Left: Back)  Patient Location: PACU  Anesthesia Type:General  Level of Consciousness: drowsy, patient cooperative, and responds to stimulation  Airway & Oxygen Therapy: Patient Spontanous Breathing and Patient connected to face mask oxygen  Post-op Assessment: Report given to RN and Post -op Vital signs reviewed and stable  Post vital signs: Reviewed and stable  Last Vitals:  Vitals Value Taken Time  BP 134/70 04/27/23 0945  Temp 36.5 C 04/27/23 0939  Pulse 87 04/27/23 0954  Resp 17 04/27/23 0954  SpO2 99 % 04/27/23 0954  Vitals shown include unfiled device data.     Complications: No notable events documented.

## 2023-04-28 ENCOUNTER — Encounter (HOSPITAL_COMMUNITY): Payer: Self-pay | Admitting: Neurological Surgery

## 2023-04-28 DIAGNOSIS — M5117 Intervertebral disc disorders with radiculopathy, lumbosacral region: Secondary | ICD-10-CM | POA: Diagnosis not present

## 2023-04-28 LAB — GLUCOSE, CAPILLARY
Glucose-Capillary: 188 mg/dL — ABNORMAL HIGH (ref 70–99)
Glucose-Capillary: 191 mg/dL — ABNORMAL HIGH (ref 70–99)

## 2023-04-28 MED ORDER — OXYCODONE HCL 5 MG PO TABS: 5.0000 mg | ORAL_TABLET | ORAL | 0 refills | Status: DC | PRN

## 2023-04-28 NOTE — Discharge Summary (Signed)
Discharge Summary  Date of Admission: 04/27/2023  Date of Discharge: 04/28/23  Attending Physician: Autumn Patty, MD  Hospital Course: Patient was admitted following an uncomplicated repeat L5-S1 MIS discectomy. They were recovered in PACU and transferred to [facility]. Their preop symptoms were improved, their hospital course was uncomplicated and the patient was discharged home. They will follow up in clinic with me in clinic in 2 weeks.  Neurologic exam at discharge:  Strength 5/5 x4 and SILTx4 except L S1 numbness  Discharge diagnosis: Recurrent lumbar disc herniation with radiculopathy   Jadene Pierini, MD 04/28/23 9:03 AM

## 2023-04-28 NOTE — Evaluation (Signed)
Occupational Therapy Evaluation Patient Details Name: Suzanne [name] MRN: [id number] DOB: 08/09/1975 Today's Date: 04/28/2023   History of Present Illness Pt is a 47 y/o F s/p repeat L minimally invasive L5-S1 discectomy after recent fall. PMH includes anemia, DM, GERD, headache, HTN, sciatica   Clinical Impression   Pt lives with her mother and child, reports is typically ind at home with ADLs/functional mobility but family can assist at d/c if needed. Pt with LLE pain, overall needing set up - supervision for ADLs, mod I for bed mobility and supervision for transfers without AD. Pt occasionally reaching out for external support. Pt educated on compensatory strategies for ADLs, back precautions, and log roll technique for bed mobility. Discussed BSC as shower seat for home.  Pt presenting with impairments listed below, will follow acutely. Anticipate no OT follow up needs at d/c.        If plan is discharge home, recommend the following: A little help with bathing/dressing/bathroom;Help with stairs or ramp for entrance;Assistance with cooking/housework    Functional Status Assessment  Patient has had a recent decline in their functional status and demonstrates the ability to make significant improvements in function in a reasonable and predictable amount of time.  Equipment Recommendations  BSC/3in1 (as shower seat)    Recommendations for Other Services PT consult     Precautions / Restrictions Precautions Precautions: Back Precaution Booklet Issued: Yes (comment) Precaution Comments: educated on 3/3 back prec Required Braces or Orthoses: Other Brace Other Brace: no brace needed per MD order Restrictions Weight Bearing Restrictions: No      Mobility Bed Mobility Overal bed mobility: Modified Independent             General bed mobility comments: use of bed rail, log roll technique, incr time    Transfers Overall transfer level: Needs assistance Equipment used:  None Transfers: Sit to/from Stand Sit to Stand: Supervision                  Balance Overall balance assessment: Mild deficits observed, not formally tested                                         ADL either performed or assessed with clinical judgement   ADL Overall ADL's : Needs assistance/impaired Eating/Feeding: Set up   Grooming: Set up;Standing   Upper Body Bathing: Supervision/ safety   Lower Body Bathing: Supervison/ safety   Upper Body Dressing : Supervision/safety   Lower Body Dressing: Supervision/safety   Toilet Transfer: Supervision/safety;Ambulation;Regular Social worker and Hygiene: Supervision/safety;Sitting/lateral lean       Functional mobility during ADLs: Supervision/safety       Vision Baseline Vision/History: 1 Wears glasses Vision Assessment?: No apparent visual deficits     Perception Perception: Not tested       Praxis Praxis: Not tested       Pertinent Vitals/Pain Pain Assessment Pain Assessment: Faces Pain Score: 5  Faces Pain Scale: Hurts even more Pain Location: LLE Pain Descriptors / Indicators: Discomfort Pain Intervention(s): Limited activity within patient's tolerance, Monitored during session, Repositioned     Extremity/Trunk Assessment Upper Extremity Assessment Upper Extremity Assessment: Overall WFL for tasks assessed   Lower Extremity Assessment Lower Extremity Assessment: Defer to PT evaluation   Cervical / Trunk Assessment Cervical / Trunk Assessment: Normal   Communication Communication Communication: No apparent difficulties   Cognition Arousal:  Alert Behavior During Therapy: WFL for tasks assessed/performed Overall Cognitive Status: Within Functional Limits for tasks assessed                                       General Comments  VSS on RA    Exercises     Shoulder Instructions      Home Living Family/patient expects to be  discharged to:: Private residence Living Arrangements: Children;Parent (mom & daughter) Available Help at Discharge: Family;Available PRN/intermittently Type of Home: House Home Access: Stairs to enter [employer] of Steps: 3 Entrance Stairs-Rails: Can reach both Home Layout: One level     Bathroom Shower/Tub: Runner, broadcasting/film/video: None          Prior Functioning/Environment Prior Level of Function : Independent/Modified Independent             Mobility Comments: no AD use ADLs Comments: ind        OT Problem List: Decreased strength;Decreased range of motion;Decreased activity tolerance;Impaired balance (sitting and/or standing)      OT Treatment/Interventions: Self-care/ADL training;Therapeutic exercise;Energy conservation;DME and/or AE instruction;Therapeutic activities;Balance training;Patient/family education    OT Goals(Current goals can be found in the care plan section) Acute Rehab OT Goals Patient Stated Goal: none stated OT Goal Formulation: With patient Time For Goal Achievement: 05/12/23 Potential to Achieve Goals: Good  OT Frequency: Min 1X/week    Co-evaluation              AM-PAC OT "6 Clicks" Daily Activity     Outcome Measure Help from another person eating meals?: None Help from another person taking care of personal grooming?: None Help from another person toileting, which includes using toliet, bedpan, or urinal?: A Little Help from another person bathing (including washing, rinsing, drying)?: A Little Help from another person to put on and taking off regular upper body clothing?: A Little Help from another person to put on and taking off regular lower body clothing?: A Little 6 Click Score: 20   End of Session Nurse Communication: Mobility status  Activity Tolerance: Patient tolerated treatment well Patient left: in bed;with call bell/phone within reach  OT Visit Diagnosis: Unsteadiness on feet  (R26.81);Other abnormalities of gait and mobility (R26.89);Muscle weakness (generalized) (M62.81)                Time: 1610-9604 OT Time Calculation (min): 26 min Charges:  OT General Charges $OT Visit: 1 Visit OT Evaluation $OT Eval Low Complexity: 1 Low OT Treatments $Self Care/Home Management : 8-22 mins  Carver Fila, OTD, OTR/L SecureChat Preferred Acute Rehab [phone]   Isiah Scheel K Koonce 04/28/2023, 9:02 AM

## 2023-04-28 NOTE — Plan of Care (Signed)
Pt doing well. Pt given D/C instructions with verbal understanding. Rx's were sent to the pharmacy by MD. Pt's incision is clean and dry with no sign of infection. Pt's IV was removed prior to D/C. Pt D/C'd home via wheelchair per MD order. Pt received SPC and 3-n-1 from [employer] per MD order. Pt is stable @ D/C and has no other needs at this time. Rema Fendt, RN

## 2023-04-28 NOTE — Evaluation (Signed)
Physical Therapy Evaluation  Patient Details Name: Suzanne [name] MRN: [id number] DOB: 20-Jun-1976 Today's Date: 04/28/2023  History of Present Illness  Pt is a 47 y/o F s/p repeat L minimally invasive L5-S1 discectomy after recent fall. PMH includes anemia, DM, GERD, headache, HTN, sciatica   Clinical Impression  Pt admitted with above diagnosis. At the time of PT eval, pt was able to demonstrate transfers and ambulation with gross modified independence to supervision for safety. 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 Pt was educated on precautions, positioning recommendations, appropriate activity progression, and car transfer. Pt currently with functional limitations due to the deficits listed below (see PT Problem List). Pt will benefit from skilled PT to increase their independence and safety with mobility to allow discharge to the venue listed below.          If plan is discharge home, recommend the following: A little help with walking and/or transfers;A little help with bathing/dressing/bathroom;Assistance with cooking/housework;Assist for transportation;Help with stairs or ramp for entrance   Can travel by private vehicle        Equipment Recommendations Cane  Recommendations for Other Services       Functional Status Assessment Patient has had a recent decline in their functional status and demonstrates the ability to make significant improvements in function in a reasonable and predictable amount of time.     Precautions / Restrictions Precautions Precautions: Back Precaution Booklet Issued: Yes (comment) Precaution Comments: educated on 3/3 back prec Required Braces or Orthoses: Other Brace Other Brace: no brace needed per MD order Restrictions Weight Bearing Restrictions: No      Mobility  Bed Mobility Overal bed mobility: Modified Independent             General bed mobility comments: HOB flat and rails lowered to  simualte home environment. Pt demonstrated good log roll technique.    Transfers Overall transfer level: Needs assistance Equipment used: None Transfers: Sit to/from Stand Sit to Stand: Supervision           General transfer comment: VC's for improved posture, hand placement on seated surface for safety and wide BOS.    Ambulation/Gait Ambulation/Gait assistance: Supervision Gait Distance (Feet): 300 Feet Assistive device: None, Straight cane Gait Pattern/deviations: Step-through pattern, Decreased stride length, Trunk flexed Gait velocity: Decreased Gait velocity interpretation: 1.31 - 2.62 ft/sec, indicative of limited community ambulator   General Gait Details: VC's for improved posture, closer walker proximity and forward gaze. Pt antalgic and holding to railing in hallway initially without AD. With addition of the SPC, pt was able to improve gait pattern and reports decreased pain.  Stairs Stairs: Yes Stairs assistance: Contact guard assist, Supervision Stair Management: One rail Right, With cane, Step to pattern Number of Stairs: 10 General stair comments: VC's for sequencing with the SPC, and general safety. No assist required.  Wheelchair Mobility     Tilt Bed    Modified Rankin (Stroke Patients Only)       Balance Overall balance assessment: Mild deficits observed, not formally tested                                           Pertinent Vitals/Pain Pain Assessment Pain Assessment: Faces Faces Pain Scale: Hurts even more Pain Location: LLE Pain Descriptors / Indicators: Discomfort Pain Intervention(s): Limited activity within patient's tolerance, Monitored during  session, Repositioned    Home Living Family/patient expects to be discharged to:: Private residence Living Arrangements: Children;Parent (mom & daughter) Available Help at Discharge: Family;Available PRN/intermittently Type of Home: House Home Access: Stairs to  enter Entrance Stairs-Rails: Can reach both Entrance Stairs-Number of Steps: 3   Home Layout: One level Home Equipment: None      Prior Function Prior Level of Function : Independent/Modified Independent             Mobility Comments: no AD use ADLs Comments: ind     Extremity/Trunk Assessment   Upper Extremity Assessment Upper Extremity Assessment: Defer to OT evaluation    Lower Extremity Assessment Lower Extremity Assessment: Generalized weakness (Mild; consistent with pre-op diagnosis)    Cervical / Trunk Assessment Cervical / Trunk Assessment: Back Surgery  Communication   Communication Communication: No apparent difficulties  Cognition Arousal: Alert Behavior During Therapy: WFL for tasks assessed/performed Overall Cognitive Status: Within Functional Limits for tasks assessed                                          General Comments General comments (skin integrity, edema, etc.): VSS on RA    Exercises     Assessment/Plan    PT Assessment Patient needs continued PT services  PT Problem List Decreased strength;Decreased activity tolerance;Decreased balance;Decreased mobility;Decreased knowledge of use of DME;Decreased safety awareness;Decreased knowledge of precautions;Pain       PT Treatment Interventions DME instruction;Gait training;Functional mobility training;Stair training;Therapeutic activities;Therapeutic exercise;Balance training;Patient/family education    PT Goals (Current goals can be found in the Care Plan section)  Acute Rehab PT Goals Patient Stated Goal: Home today, be able to get back to church PT Goal Formulation: With patient Time For Goal Achievement: 05/05/23 Potential to Achieve Goals: Good    Frequency Min 5X/week     Co-evaluation               AM-PAC PT "6 Clicks" Mobility  Outcome Measure Help needed turning from your back to your side while in a flat bed without using bedrails?: None Help  needed moving from lying on your back to sitting on the side of a flat bed without using bedrails?: None Help needed moving to and from a bed to a chair (including a wheelchair)?: A Little Help needed standing up from a chair using your arms (e.g., wheelchair or bedside chair)?: A Little Help needed to walk in hospital room?: A Little Help needed climbing 3-5 steps with a railing? : A Little 6 Click Score: 20    End of Session Equipment Utilized During Treatment: Gait belt Activity Tolerance: Patient tolerated treatment well Patient left: in bed;with call bell/phone within reach Nurse Communication: Mobility status PT Visit Diagnosis: Unsteadiness on feet (R26.81);Pain Pain - part of body:  (back)    Time: 6063-0160 PT Time Calculation (min) (ACUTE ONLY): 18 min   Charges:   PT Evaluation $PT Eval Low Complexity: 1 Low   PT General Charges $$ ACUTE PT VISIT: 1 Visit         Conni Slipper, PT, DPT Acute Rehabilitation Services Secure Chat Preferred Office: [phone]   Marylynn Pearson 04/28/2023, 11:38 AM

## 2023-04-28 NOTE — Progress Notes (Signed)
Neurosurgery Service Progress Note  Subjective: No acute events overnight, back hurts, leg pain is present but improved from preop - only to the lateral hip, no longer extending down to the foot, no weakness, numbness is stable from preop   Objective: Vitals:   04/27/23 2013 04/27/23 2241 04/28/23 0404 04/28/23 0831  BP: (!) 154/89 137/75 128/82 135/83  Pulse: 98 91 80 95  Resp: 20 19 20 16   Temp: 98.9 F (37.2 C) 98.4 F (36.9 C) 97.8 F (36.6 C) 98.8 F (37.1 C)  TempSrc: Oral Oral Oral Oral  SpO2: 100% 98% 98% 98%  Weight:      Height:        Physical Exam: Strength 5/5 x4 and SILTx4 except L S1 numbness, incision c/d/i  Assessment & Plan: 47 y.o. woman s/p repeat 5-1 MIS discectomy, recovering well.  -discharge home today  Jadene Pierini  04/28/23 9:03 AM

## 2023-05-02 ENCOUNTER — Encounter (HOSPITAL_BASED_OUTPATIENT_CLINIC_OR_DEPARTMENT_OTHER): Payer: Self-pay

## 2023-05-02 ENCOUNTER — Other Ambulatory Visit: Payer: Self-pay

## 2023-05-02 ENCOUNTER — Inpatient Hospital Stay (HOSPITAL_BASED_OUTPATIENT_CLINIC_OR_DEPARTMENT_OTHER)
Admission: EM | Admit: 2023-05-02 | Discharge: 2023-05-04 | DRG: 661 | Disposition: A | Discharge status: Home / Self Care | Payer: Medicaid Other | Attending: Internal Medicine | Admitting: Internal Medicine

## 2023-05-02 ENCOUNTER — Emergency Department (HOSPITAL_BASED_OUTPATIENT_CLINIC_OR_DEPARTMENT_OTHER): Payer: Medicaid Other

## 2023-05-02 DIAGNOSIS — Z79899 Other long term (current) drug therapy: Secondary | ICD-10-CM

## 2023-05-02 DIAGNOSIS — N179 Acute kidney failure, unspecified: Secondary | ICD-10-CM

## 2023-05-02 DIAGNOSIS — E119 Type 2 diabetes mellitus without complications: Secondary | ICD-10-CM | POA: Diagnosis present

## 2023-05-02 DIAGNOSIS — Z7984 Long term (current) use of oral hypoglycemic drugs: Secondary | ICD-10-CM

## 2023-05-02 DIAGNOSIS — Z7982 Long term (current) use of aspirin: Secondary | ICD-10-CM

## 2023-05-02 DIAGNOSIS — Z9102 Food additives allergy status: Secondary | ICD-10-CM

## 2023-05-02 DIAGNOSIS — E876 Hypokalemia: Secondary | ICD-10-CM

## 2023-05-02 DIAGNOSIS — N201 Calculus of ureter: Secondary | ICD-10-CM | POA: Diagnosis not present

## 2023-05-02 DIAGNOSIS — R52 Pain, unspecified: Secondary | ICD-10-CM | POA: Diagnosis present

## 2023-05-02 DIAGNOSIS — K219 Gastro-esophageal reflux disease without esophagitis: Secondary | ICD-10-CM | POA: Diagnosis present

## 2023-05-02 DIAGNOSIS — I10 Essential (primary) hypertension: Secondary | ICD-10-CM

## 2023-05-02 DIAGNOSIS — Z91018 Allergy to other foods: Secondary | ICD-10-CM

## 2023-05-02 DIAGNOSIS — Z833 Family history of diabetes mellitus: Secondary | ICD-10-CM

## 2023-05-02 DIAGNOSIS — F17210 Nicotine dependence, cigarettes, uncomplicated: Secondary | ICD-10-CM | POA: Diagnosis present

## 2023-05-02 DIAGNOSIS — Z91040 Latex allergy status: Secondary | ICD-10-CM

## 2023-05-02 DIAGNOSIS — N132 Hydronephrosis with renal and ureteral calculous obstruction: Principal | ICD-10-CM | POA: Diagnosis present

## 2023-05-02 DIAGNOSIS — Z87442 Personal history of urinary calculi: Secondary | ICD-10-CM

## 2023-05-02 DIAGNOSIS — Z8249 Family history of ischemic heart disease and other diseases of the circulatory system: Secondary | ICD-10-CM

## 2023-05-02 LAB — URINALYSIS, ROUTINE W REFLEX MICROSCOPIC
Bilirubin Urine: NEGATIVE
Glucose, UA: NEGATIVE mg/dL
Hgb urine dipstick: NEGATIVE
Ketones, ur: NEGATIVE mg/dL
Leukocytes,Ua: NEGATIVE
Nitrite: NEGATIVE
Protein, ur: NEGATIVE mg/dL
Specific Gravity, Urine: 1.021 (ref 1.005–1.030)
pH: 7 (ref 5.0–8.0)

## 2023-05-02 LAB — CBC
HCT: 36.2 % (ref 36.0–46.0)
Hemoglobin: 12.2 g/dL (ref 12.0–15.0)
MCH: 32.7 pg (ref 26.0–34.0)
MCHC: 33.7 g/dL (ref 30.0–36.0)
MCV: 97.1 fL (ref 80.0–100.0)
Platelets: 301 10*3/uL (ref 150–400)
RBC: 3.73 MIL/uL — ABNORMAL LOW (ref 3.87–5.11)
RDW: 12.4 % (ref 11.5–15.5)
WBC: 12 10*3/uL — ABNORMAL HIGH (ref 4.0–10.5)
nRBC: 0 % (ref 0.0–0.2)

## 2023-05-02 LAB — BASIC METABOLIC PANEL
Anion gap: 8 (ref 5–15)
BUN: 18 mg/dL (ref 6–20)
CO2: 27 mmol/L (ref 22–32)
Calcium: 9.3 mg/dL (ref 8.9–10.3)
Chloride: 105 mmol/L (ref 98–111)
Creatinine, Ser: 1.43 mg/dL — ABNORMAL HIGH (ref 0.44–1.00)
GFR, Estimated: 46 mL/min — ABNORMAL LOW (ref >60)
Glucose, Bld: 118 mg/dL — ABNORMAL HIGH (ref 70–99)
Potassium: 3.1 mmol/L — ABNORMAL LOW (ref 3.5–5.1)
Sodium: 140 mmol/L (ref 135–145)

## 2023-05-02 LAB — PREGNANCY, URINE: Preg Test, Ur: NEGATIVE

## 2023-05-02 MED ORDER — HYDROMORPHONE HCL 1 MG/ML IJ SOLN
1.0000 mg | Freq: Once | INTRAMUSCULAR | Status: AC
  Administered 2023-05-02: 1 mg via INTRAVENOUS
  Filled 2023-05-02: qty 1

## 2023-05-02 MED ORDER — KETOROLAC TROMETHAMINE 30 MG/ML IJ SOLN
15.0000 mg | Freq: Once | INTRAMUSCULAR | Status: AC
  Administered 2023-05-02: 15 mg via INTRAVENOUS
  Filled 2023-05-02: qty 1

## 2023-05-02 MED ORDER — ONDANSETRON HCL 4 MG/2ML IJ SOLN
4.0000 mg | Freq: Once | INTRAMUSCULAR | Status: AC
  Administered 2023-05-02: 4 mg via INTRAVENOUS
  Filled 2023-05-02: qty 2

## 2023-05-02 MED ORDER — FENTANYL CITRATE PF 50 MCG/ML IJ SOSY
50.0000 ug | PREFILLED_SYRINGE | INTRAMUSCULAR | Status: DC | PRN
  Administered 2023-05-02: 50 ug via INTRAVENOUS
  Filled 2023-05-02: qty 1

## 2023-05-02 MED ORDER — HYOSCYAMINE SULFATE 0.125 MG SL SUBL
0.1250 mg | SUBLINGUAL_TABLET | Freq: Once | SUBLINGUAL | Status: AC
  Administered 2023-05-02: 0.125 mg via ORAL
  Filled 2023-05-02: qty 1

## 2023-05-02 MED ORDER — SODIUM CHLORIDE 0.9 % IV SOLN: Freq: Once | INTRAVENOUS | Status: AC

## 2023-05-02 MED ORDER — HYDROMORPHONE HCL 1 MG/ML IJ SOLN
0.5000 mg | Freq: Once | INTRAMUSCULAR | Status: AC
  Administered 2023-05-02: 0.5 mg via INTRAVENOUS
  Filled 2023-05-02: qty 1

## 2023-05-02 NOTE — ED Provider Notes (Signed)
[facility] AT [facility] Provider Note   CSN: [id number] Arrival date & time: 05/02/23  1858     History {Add pertinent medical, surgical, social history, OB history to HPI:1} Chief Complaint  Patient presents with  . Post-op Problem    Suzanne [name] is a 47 y.o. female.  47 year old female who presents emergency department chief complaint of left flank pain.  Patient had onset of severe pain in her left lower abdominal quadrant quadrant radiating into her left labia that woke her out of her sleep this morning.  Pain is severe, sharp, stabbing "like an ice pick."  Patient had similar symptoms about a year ago when she was diagnosed with a kidney stone.  She has had associated nausea without vomiting.  She is status post microdiscectomy at L4-L5 by Dr. Johnsie Cancel on Tuesday of this past week.    The history is provided by the patient and medical records.  Flank Pain This is a recurrent problem. The current episode started 6 to 12 hours ago. The problem occurs constantly. The problem has not changed since onset.      Home Medications Prior to Admission medications   Medication Sig Start Date End Date Taking? Authorizing Provider  amLODipine (NORVASC) 10 MG tablet Take 1 tablet (10 mg total) by mouth daily. 08/18/17   Marny Lowenstein, PA-C  aspirin 81 MG chewable tablet Chew 81 mg by mouth in the morning.    [provider]  gabapentin (NEURONTIN) 300 MG capsule Take 300 mg by mouth 3 (three) times daily.    [provider]  hydrALAZINE (APRESOLINE) 10 MG tablet Take 1 tablet (10 mg total) by mouth in the morning and at bedtime. 06/26/22 10/13/23  Terald Sleeper, MD  metFORMIN (GLUCOPHAGE) 500 MG tablet Take 1 tablet (500 mg total) by mouth 2 (two) times daily with a meal. 03/30/22 10/13/23  Curatolo, Adam, DO  omeprazole (PRILOSEC) 20 MG capsule Take 20 mg by mouth in the morning.    [provider]  oxyCODONE (OXY  IR/ROXICODONE) 5 MG immediate release tablet Take 1 tablet (5 mg total) by mouth every 4 (four) hours as needed (pain). 04/28/23   Jadene Pierini, MD      Allergies    Coconut flavor, Latex, Pineapple, and Tomato    Review of Systems   Review of Systems  Genitourinary:  Positive for flank pain.    Physical Exam Updated Vital Signs BP (!) 143/89   Pulse 72   Temp 97.7 F (36.5 C)   Resp 18   Ht 5\' 6"  (1.676 m)   Wt 92.1 kg   SpO2 100%   BMI 32.77 kg/m  Physical Exam  ED Results / Procedures / Treatments   Labs (all labs ordered are listed, but only abnormal results are displayed) Labs Reviewed  CBC - Abnormal; Notable for the following components:      Result Value   WBC 12.0 (*)    RBC 3.73 (*)    All other components within normal limits  URINALYSIS, ROUTINE W REFLEX MICROSCOPIC  BASIC METABOLIC PANEL  PREGNANCY, URINE    EKG None  Radiology CT Renal Stone Study  Result Date: 05/02/2023 CLINICAL DATA:  Abdominal pain. EXAM: CT ABDOMEN AND PELVIS WITHOUT CONTRAST TECHNIQUE: Multidetector CT imaging of the abdomen and pelvis was performed following the standard protocol without IV contrast. RADIATION DOSE REDUCTION: This exam was performed according to the departmental dose-optimization program which includes automated exposure control, adjustment of  the mA and/or kV according to patient size and/or use of iterative reconstruction technique. COMPARISON:  CT abdomen pelvis dated 05/19/2022. FINDINGS: Evaluation of this exam is limited in the absence of intravenous contrast. Lower chest: The visualized lung bases are clear. No intra-abdominal free air or free fluid. Hepatobiliary: The liver is unremarkable. No biliary dilatation. The gallbladder is unremarkable. Pancreas: Unremarkable. No pancreatic ductal dilatation or surrounding inflammatory changes. Spleen: Normal in size without focal abnormality. Adrenals/Urinary Tract: The adrenal glands are unremarkable. Similar  location of a 14 mm stone at the left ureteropelvic junction with mild to moderate left hydronephrosis. The degree of hydronephrosis is similar or slightly increased since the prior CT. There is a 5 mm nonobstructing left renal inferior pole calculus. There is left perinephric stranding, nonspecific. Correlation with urinalysis recommended to exclude superimposed UTI. The right kidney is unremarkable. The visualized right ureter and urinary bladder appear unremarkable. Stomach/Bowel: There is no bowel obstruction or active inflammation. The appendix is normal. Vascular/Lymphatic: Mild aortoiliac atherosclerotic disease. The IVC is unremarkable. No portal venous gas. There is no adenopathy. Reproductive: The uterus is anteverted and grossly unremarkable. No adnexal masses. Other: None Musculoskeletal: No acute or significant osseous findings. IMPRESSION: 1. A 14 mm left UVJ calculus with mild to moderate hydronephrosis, minimally increased since the prior CT. 2. A 5 mm nonobstructing left renal inferior pole calculus. 3. No bowel obstruction. Normal appendix. 4.  Aortic Atherosclerosis (ICD10-I70.0). Electronically Signed   By: Elgie Collard M.D.   On: 05/02/2023 21:47    Procedures Procedures  {Document cardiac monitor, telemetry assessment procedure when appropriate:1}  Medications Ordered in ED Medications  fentaNYL (SUBLIMAZE) injection 50 mcg (50 mcg Intravenous Given 05/02/23 1928)  ondansetron (ZOFRAN) injection 4 mg (4 mg Intravenous Given 05/02/23 1925)  HYDROmorphone (DILAUDID) injection 0.5 mg (0.5 mg Intravenous Given 05/02/23 2006)  hyoscyamine (LEVSIN SL) SL tablet 0.125 mg (0.125 mg Oral Given 05/02/23 2131)  HYDROmorphone (DILAUDID) injection 1 mg (1 mg Intravenous Given 05/02/23 2131)    ED Course/ Medical Decision Making/ A&P Clinical Course as of 05/02/23 2207  Sun May 02, 2023  2158 I reviewed the patient's CT scan which shows a 14 mm proximal left-sided ureteral stone.  Previous  CT from a year ago on May 19, 2022 shows a similar stone in the same region.  I asked patient if she had followed up with urology.  She states she saw urology with [facility] and they were concerned about her low potassium levels.  She was told at that time that "it may just break up and pass on its own."  She has not had pain until today. [AH]    Clinical Course User Index [AH] Arthor Captain, PA-C   {   Click here for ABCD2, HEART and other calculatorsREFRESH Note before signing :1}                              Medical Decision Making Amount and/or Complexity of Data Reviewed Labs: ordered. Radiology: ordered.  Risk Prescription drug management.   ***  {Document critical care time when appropriate:1} {Document review of labs and clinical decision tools ie heart score, Chads2Vasc2 etc:1}  {Document your independent review of radiology images, and any outside records:1} {Document your discussion with family members, caretakers, and with consultants:1} {Document social determinants of health affecting pt's care:1} {Document your decision making why or why not admission, treatments were needed:1} Final Clinical Impression(s) / ED  Diagnoses Final diagnoses:  None    Rx / DC Orders ED Discharge Orders     None

## 2023-05-02 NOTE — ED Notes (Signed)
Patient presents to ED c/o left flank and LLQ abdominal pain. Patient reports disc surgery last week and woke up in pain. Patient states "I also had a kidney stone I thought it went away but it might not have".

## 2023-05-02 NOTE — ED Triage Notes (Signed)
Pt had surgery on a herniated disc earlier this week. Today pt was awakened from sleep with severe back pain with radiation to the front of her leg. Pt is tearful in triage.

## 2023-05-03 ENCOUNTER — Other Ambulatory Visit: Payer: Self-pay | Admitting: Urology

## 2023-05-03 ENCOUNTER — Inpatient Hospital Stay (HOSPITAL_COMMUNITY): Payer: Medicaid Other | Admitting: Anesthesiology

## 2023-05-03 ENCOUNTER — Encounter (HOSPITAL_COMMUNITY)
Admission: EM | Disposition: A | Discharge status: Home / Self Care | Payer: Self-pay | Source: Home / Self Care | Attending: Internal Medicine

## 2023-05-03 ENCOUNTER — Encounter (HOSPITAL_COMMUNITY): Payer: Self-pay | Admitting: Family Medicine

## 2023-05-03 ENCOUNTER — Inpatient Hospital Stay (HOSPITAL_COMMUNITY): Payer: Medicaid Other

## 2023-05-03 DIAGNOSIS — F17210 Nicotine dependence, cigarettes, uncomplicated: Secondary | ICD-10-CM | POA: Diagnosis present

## 2023-05-03 DIAGNOSIS — Z833 Family history of diabetes mellitus: Secondary | ICD-10-CM | POA: Diagnosis not present

## 2023-05-03 DIAGNOSIS — N201 Calculus of ureter: Secondary | ICD-10-CM

## 2023-05-03 DIAGNOSIS — R52 Pain, unspecified: Secondary | ICD-10-CM | POA: Diagnosis present

## 2023-05-03 DIAGNOSIS — Z8249 Family history of ischemic heart disease and other diseases of the circulatory system: Secondary | ICD-10-CM | POA: Diagnosis not present

## 2023-05-03 DIAGNOSIS — Z91018 Allergy to other foods: Secondary | ICD-10-CM | POA: Diagnosis not present

## 2023-05-03 DIAGNOSIS — Z87442 Personal history of urinary calculi: Secondary | ICD-10-CM | POA: Diagnosis not present

## 2023-05-03 DIAGNOSIS — N132 Hydronephrosis with renal and ureteral calculous obstruction: Secondary | ICD-10-CM | POA: Diagnosis present

## 2023-05-03 DIAGNOSIS — E119 Type 2 diabetes mellitus without complications: Secondary | ICD-10-CM | POA: Diagnosis present

## 2023-05-03 DIAGNOSIS — I10 Essential (primary) hypertension: Secondary | ICD-10-CM | POA: Diagnosis present

## 2023-05-03 DIAGNOSIS — E876 Hypokalemia: Secondary | ICD-10-CM | POA: Diagnosis present

## 2023-05-03 DIAGNOSIS — Z9102 Food additives allergy status: Secondary | ICD-10-CM | POA: Diagnosis not present

## 2023-05-03 DIAGNOSIS — K219 Gastro-esophageal reflux disease without esophagitis: Secondary | ICD-10-CM | POA: Diagnosis present

## 2023-05-03 DIAGNOSIS — Z91040 Latex allergy status: Secondary | ICD-10-CM | POA: Diagnosis not present

## 2023-05-03 DIAGNOSIS — N179 Acute kidney failure, unspecified: Secondary | ICD-10-CM | POA: Diagnosis present

## 2023-05-03 DIAGNOSIS — Z7982 Long term (current) use of aspirin: Secondary | ICD-10-CM | POA: Diagnosis not present

## 2023-05-03 DIAGNOSIS — Z79899 Other long term (current) drug therapy: Secondary | ICD-10-CM | POA: Diagnosis not present

## 2023-05-03 DIAGNOSIS — Z7984 Long term (current) use of oral hypoglycemic drugs: Secondary | ICD-10-CM | POA: Diagnosis not present

## 2023-05-03 HISTORY — PX: CYSTOSCOPY WITH RETROGRADE PYELOGRAM, URETEROSCOPY AND STENT PLACEMENT: SHX5789

## 2023-05-03 LAB — CBC
HCT: 38.4 % (ref 36.0–46.0)
Hemoglobin: 12.3 g/dL (ref 12.0–15.0)
MCH: 32.4 pg (ref 26.0–34.0)
MCHC: 32 g/dL (ref 30.0–36.0)
MCV: 101.1 fL — ABNORMAL HIGH (ref 80.0–100.0)
Platelets: 305 10*3/uL (ref 150–400)
RBC: 3.8 MIL/uL — ABNORMAL LOW (ref 3.87–5.11)
RDW: 12.3 % (ref 11.5–15.5)
WBC: 10.2 10*3/uL (ref 4.0–10.5)
nRBC: 0 % (ref 0.0–0.2)

## 2023-05-03 LAB — GLUCOSE, CAPILLARY
Glucose-Capillary: 101 mg/dL — ABNORMAL HIGH (ref 70–99)
Glucose-Capillary: 113 mg/dL — ABNORMAL HIGH (ref 70–99)
Glucose-Capillary: 116 mg/dL — ABNORMAL HIGH (ref 70–99)
Glucose-Capillary: 207 mg/dL — ABNORMAL HIGH (ref 70–99)
Glucose-Capillary: 268 mg/dL — ABNORMAL HIGH (ref 70–99)

## 2023-05-03 LAB — BASIC METABOLIC PANEL
Anion gap: 10 (ref 5–15)
BUN: 17 mg/dL (ref 6–20)
CO2: 23 mmol/L (ref 22–32)
Calcium: 9.1 mg/dL (ref 8.9–10.3)
Chloride: 104 mmol/L (ref 98–111)
Creatinine, Ser: 1.28 mg/dL — ABNORMAL HIGH (ref 0.44–1.00)
GFR, Estimated: 52 mL/min — ABNORMAL LOW (ref >60)
Glucose, Bld: 139 mg/dL — ABNORMAL HIGH (ref 70–99)
Potassium: 3.2 mmol/L — ABNORMAL LOW (ref 3.5–5.1)
Sodium: 137 mmol/L (ref 135–145)

## 2023-05-03 LAB — MAGNESIUM: Magnesium: 2.4 mg/dL (ref 1.7–2.4)

## 2023-05-03 LAB — HIV ANTIBODY (ROUTINE TESTING W REFLEX): HIV Screen 4th Generation wRfx: NONREACTIVE

## 2023-05-03 SURGERY — CYSTOURETEROSCOPY, WITH RETROGRADE PYELOGRAM AND STENT INSERTION
Anesthesia: General | Site: Ureter | Laterality: Left

## 2023-05-03 MED ORDER — PROPOFOL 10 MG/ML IV BOLUS
INTRAVENOUS | Status: AC
  Filled 2023-05-03: qty 20

## 2023-05-03 MED ORDER — AMLODIPINE BESYLATE 10 MG PO TABS
10.0000 mg | ORAL_TABLET | Freq: Every day | ORAL | Status: DC
  Administered 2023-05-03 – 2023-05-04 (×2): 10 mg via ORAL
  Filled 2023-05-03 (×2): qty 1

## 2023-05-03 MED ORDER — HYDROMORPHONE HCL 1 MG/ML IJ SOLN
0.5000 mg | INTRAMUSCULAR | Status: DC | PRN
  Administered 2023-05-03: 1 mg via INTRAVENOUS
  Administered 2023-05-03 (×3): .5 mg via INTRAVENOUS
  Administered 2023-05-03 (×2): 1 mg via INTRAVENOUS
  Administered 2023-05-03: .5 mg via INTRAVENOUS
  Filled 2023-05-03 (×3): qty 1

## 2023-05-03 MED ORDER — DEXAMETHASONE SODIUM PHOSPHATE 10 MG/ML IJ SOLN
INTRAMUSCULAR | Status: DC | PRN
  Administered 2023-05-03: 10 mg via INTRAVENOUS

## 2023-05-03 MED ORDER — IOHEXOL 300 MG/ML  SOLN
INTRAMUSCULAR | Status: DC | PRN
  Administered 2023-05-03: 6 mL

## 2023-05-03 MED ORDER — SODIUM CHLORIDE 0.9% FLUSH
3.0000 mL | Freq: Two times a day (BID) | INTRAVENOUS | Status: DC
  Administered 2023-05-03 – 2023-05-04 (×3): 3 mL via INTRAVENOUS

## 2023-05-03 MED ORDER — MIDAZOLAM HCL 2 MG/2ML IJ SOLN
INTRAMUSCULAR | Status: AC
  Filled 2023-05-03: qty 2

## 2023-05-03 MED ORDER — PHENYLEPHRINE 80 MCG/ML (10ML) SYRINGE FOR IV PUSH (FOR BLOOD PRESSURE SUPPORT)
PREFILLED_SYRINGE | INTRAVENOUS | Status: DC | PRN
Start: 2023-05-03 — End: 2023-05-03
  Administered 2023-05-03 (×2): 160 ug via INTRAVENOUS

## 2023-05-03 MED ORDER — SENNOSIDES-DOCUSATE SODIUM 8.6-50 MG PO TABS: 1.0000 | ORAL_TABLET | Freq: Every evening | ORAL | Status: DC | PRN

## 2023-05-03 MED ORDER — DROPERIDOL 2.5 MG/ML IJ SOLN
0.6250 mg | Freq: Once | INTRAMUSCULAR | Status: AC | PRN
  Administered 2023-05-03: 0.625 mg via INTRAVENOUS

## 2023-05-03 MED ORDER — FENTANYL CITRATE (PF) 100 MCG/2ML IJ SOLN
INTRAMUSCULAR | Status: DC | PRN
  Administered 2023-05-03: 100 ug via INTRAVENOUS

## 2023-05-03 MED ORDER — ONDANSETRON HCL 4 MG PO TABS: 4.0000 mg | ORAL_TABLET | Freq: Four times a day (QID) | ORAL | Status: DC | PRN

## 2023-05-03 MED ORDER — POTASSIUM CHLORIDE CRYS ER 20 MEQ PO TBCR
20.0000 meq | EXTENDED_RELEASE_TABLET | Freq: Once | ORAL | Status: AC
  Administered 2023-05-03: 20 meq via ORAL
  Filled 2023-05-03: qty 1

## 2023-05-03 MED ORDER — HYDROMORPHONE HCL 1 MG/ML IJ SOLN
1.0000 mg | INTRAMUSCULAR | Status: AC
  Administered 2023-05-03: 1 mg via INTRAVENOUS
  Filled 2023-05-03: qty 1

## 2023-05-03 MED ORDER — HYDROMORPHONE HCL 1 MG/ML IJ SOLN: 0.2500 mg | INTRAMUSCULAR | Status: DC | PRN

## 2023-05-03 MED ORDER — LIDOCAINE HCL (PF) 2 % IJ SOLN
INTRAMUSCULAR | Status: AC
  Filled 2023-05-03: qty 5

## 2023-05-03 MED ORDER — CEFAZOLIN SODIUM-DEXTROSE 2-4 GM/100ML-% IV SOLN
INTRAVENOUS | Status: AC
  Filled 2023-05-03: qty 100

## 2023-05-03 MED ORDER — DEXAMETHASONE SODIUM PHOSPHATE 10 MG/ML IJ SOLN
INTRAMUSCULAR | Status: AC
  Filled 2023-05-03: qty 1

## 2023-05-03 MED ORDER — DROPERIDOL 2.5 MG/ML IJ SOLN
INTRAMUSCULAR | Status: AC
  Filled 2023-05-03: qty 2

## 2023-05-03 MED ORDER — OXYCODONE HCL 5 MG/5ML PO SOLN: 5.0000 mg | Freq: Once | ORAL | Status: DC | PRN

## 2023-05-03 MED ORDER — ACETAMINOPHEN 650 MG RE SUPP: 650.0000 mg | Freq: Four times a day (QID) | RECTAL | Status: DC | PRN

## 2023-05-03 MED ORDER — PROPOFOL 10 MG/ML IV BOLUS
INTRAVENOUS | Status: DC | PRN
  Administered 2023-05-03: 200 mg via INTRAVENOUS

## 2023-05-03 MED ORDER — ONDANSETRON HCL 4 MG/2ML IJ SOLN: 4.0000 mg | Freq: Four times a day (QID) | INTRAMUSCULAR | Status: DC | PRN

## 2023-05-03 MED ORDER — OXYCODONE HCL 5 MG PO TABS: 5.0000 mg | ORAL_TABLET | Freq: Once | ORAL | Status: DC | PRN

## 2023-05-03 MED ORDER — OXYCODONE HCL 5 MG PO TABS
5.0000 mg | ORAL_TABLET | ORAL | Status: DC | PRN
  Administered 2023-05-04: 5 mg via ORAL
  Filled 2023-05-03: qty 1

## 2023-05-03 MED ORDER — INSULIN ASPART 100 UNIT/ML IJ SOLN
0.0000 [IU] | INTRAMUSCULAR | Status: DC
  Administered 2023-05-03: 3 [IU] via SUBCUTANEOUS
  Administered 2023-05-03: 5 [IU] via SUBCUTANEOUS
  Administered 2023-05-04: 3 [IU] via SUBCUTANEOUS
  Administered 2023-05-04 (×2): 2 [IU] via SUBCUTANEOUS

## 2023-05-03 MED ORDER — ONDANSETRON HCL 4 MG/2ML IJ SOLN: 4.0000 mg | Freq: Once | INTRAMUSCULAR | Status: DC | PRN

## 2023-05-03 MED ORDER — LIDOCAINE 2% (20 MG/ML) 5 ML SYRINGE
INTRAMUSCULAR | Status: DC | PRN
  Administered 2023-05-03: 60 mg via INTRAVENOUS

## 2023-05-03 MED ORDER — ONDANSETRON HCL 4 MG/2ML IJ SOLN
INTRAMUSCULAR | Status: AC
  Filled 2023-05-03: qty 2

## 2023-05-03 MED ORDER — CHLORHEXIDINE GLUCONATE 0.12 % MT SOLN
15.0000 mL | Freq: Once | OROMUCOSAL | Status: AC
  Administered 2023-05-03: 15 mL via OROMUCOSAL

## 2023-05-03 MED ORDER — HYDRALAZINE HCL 20 MG/ML IJ SOLN
5.0000 mg | INTRAMUSCULAR | Status: DC | PRN
  Administered 2023-05-03: 5 mg via INTRAVENOUS
  Filled 2023-05-03: qty 1

## 2023-05-03 MED ORDER — ORAL CARE MOUTH RINSE: 15.0000 mL | OROMUCOSAL | Status: DC | PRN

## 2023-05-03 MED ORDER — ONDANSETRON HCL 4 MG/2ML IJ SOLN
INTRAMUSCULAR | Status: DC | PRN
  Administered 2023-05-03: 4 mg via INTRAVENOUS

## 2023-05-03 MED ORDER — SODIUM CHLORIDE 0.9 % IR SOLN
Status: DC | PRN
  Administered 2023-05-03: 3000 mL

## 2023-05-03 MED ORDER — POTASSIUM CHLORIDE 10 MEQ/100ML IV SOLN
10.0000 meq | INTRAVENOUS | Status: AC
  Administered 2023-05-03 (×2): 10 meq via INTRAVENOUS
  Filled 2023-05-03 (×2): qty 100

## 2023-05-03 MED ORDER — ACETAMINOPHEN 325 MG PO TABS
650.0000 mg | ORAL_TABLET | Freq: Four times a day (QID) | ORAL | Status: DC | PRN
  Administered 2023-05-03 – 2023-05-04 (×2): 650 mg via ORAL
  Filled 2023-05-03 (×2): qty 2

## 2023-05-03 MED ORDER — MIDAZOLAM HCL 5 MG/5ML IJ SOLN
INTRAMUSCULAR | Status: DC | PRN
  Administered 2023-05-03: 2 mg via INTRAVENOUS

## 2023-05-03 MED ORDER — LACTATED RINGERS IV SOLN: INTRAVENOUS | Status: AC

## 2023-05-03 MED ORDER — LACTATED RINGERS IV SOLN: INTRAVENOUS | Status: DC

## 2023-05-03 MED ORDER — METHOCARBAMOL 500 MG PO TABS: 500.0000 mg | ORAL_TABLET | Freq: Four times a day (QID) | ORAL | Status: DC | PRN

## 2023-05-03 MED ORDER — HYDROMORPHONE HCL 2 MG/ML IJ SOLN
INTRAMUSCULAR | Status: AC
  Filled 2023-05-03: qty 1

## 2023-05-03 MED ORDER — CEFAZOLIN SODIUM-DEXTROSE 2-3 GM-%(50ML) IV SOLR
INTRAVENOUS | Status: DC | PRN
Start: 2023-05-03 — End: 2023-05-03
  Administered 2023-05-03: 2 g via INTRAVENOUS

## 2023-05-03 MED ORDER — FENTANYL CITRATE (PF) 100 MCG/2ML IJ SOLN
INTRAMUSCULAR | Status: AC
  Filled 2023-05-03: qty 2

## 2023-05-03 SURGICAL SUPPLY — 25 items
BAG URO CATCHER STRL LF (MISCELLANEOUS) ×2 IMPLANT
BASKET ZERO TIP NITINOL 2.4FR (BASKET) IMPLANT
BSKT STON RTRVL ZERO TP 2.4FR (BASKET)
CATH URETERAL DUAL LUMEN 10F (MISCELLANEOUS) IMPLANT
CATH URETL OPEN END 6FR 70 (CATHETERS) IMPLANT
CLOTH BEACON ORANGE TIMEOUT ST (SAFETY) ×2 IMPLANT
FIBER LASER MOSES 200 DFL (Laser) IMPLANT
FIBER LASER MOSES 365 DFL (Laser) IMPLANT
GLOVE BIO SURGEON STRL SZ8.5 (GLOVE) ×2 IMPLANT
GOWN STRL REUS W/ TWL XL LVL3 (GOWN DISPOSABLE) ×2 IMPLANT
GOWN STRL REUS W/TWL XL LVL3 (GOWN DISPOSABLE) ×1
GUIDEWIRE AMPLATZ STIFF 0.35 (WIRE) IMPLANT
GUIDEWIRE STR DUAL SENSOR (WIRE) ×2 IMPLANT
GUIDEWIRE SUPER STIFF (WIRE) IMPLANT
GUIDEWIRE ZIPWRE .038 STRAIGHT (WIRE) IMPLANT
KIT TURNOVER KIT A (KITS) IMPLANT
MANIFOLD NEPTUNE II (INSTRUMENTS) ×2 IMPLANT
PACK CYSTO (CUSTOM PROCEDURE TRAY) ×2 IMPLANT
PAD PREP 24X48 CUFFED NSTRL (MISCELLANEOUS) ×2 IMPLANT
SHEATH NAVIGATOR HD 11/13X28 (SHEATH) IMPLANT
SHEATH NAVIGATOR HD 11/13X36 (SHEATH) IMPLANT
STENT URET 6FRX24 CONTOUR (STENTS) IMPLANT
STENT URET 6FRX26 CONTOUR (STENTS) IMPLANT
TUBING CONNECTING 10 (TUBING) ×2 IMPLANT
TUBING UROLOGY SET (TUBING) ×2 IMPLANT

## 2023-05-03 NOTE — Anesthesia Preprocedure Evaluation (Addendum)
Anesthesia Evaluation  Patient identified by MRN, date of birth, ID band Patient awake    Reviewed: Allergy & Precautions, NPO status , Patient's Chart, lab work & pertinent test results  Airway Mallampati: II       Dental no notable dental hx. (+) Dental Advisory Given, Teeth Intact   Pulmonary Current Smoker and Patient abstained from smoking.   Pulmonary exam normal breath sounds clear to auscultation       Cardiovascular hypertension, Pt. on medications Normal cardiovascular exam Rhythm:Regular Rate:Normal     Neuro/Psych  Headaches  Neuromuscular disease    GI/Hepatic Neg liver ROS,GERD  Medicated,,  Endo/Other  diabetes, Well Controlled, Type 2, Oral Hypoglycemic Agents  Obesity  Renal/GU Renal InsufficiencyRenal diseaseLeft ureteral calculus  negative genitourinary   Musculoskeletal Chronic LBP   Abdominal  (+) + obese  Peds  Hematology  (+) Blood dyscrasia, anemia   Anesthesia Other Findings   Reproductive/Obstetrics                              Anesthesia Physical Anesthesia Plan  ASA: 2  Anesthesia Plan: General   Post-op Pain Management: Precedex, Ofirmev IV (intra-op)* and Dilaudid IV   Induction: Intravenous  PONV Risk Score and Plan: 4 or greater and Treatment may vary due to age or medical condition, Ondansetron and Midazolam  Airway Management Planned: LMA  Additional Equipment: None  Intra-op Plan:   Post-operative Plan: Extubation in OR  Informed Consent: I have reviewed the patients History and Physical, chart, labs and discussed the procedure including the risks, benefits and alternatives for the proposed anesthesia with the patient or authorized representative who has indicated his/her understanding and acceptance.     Dental advisory given  Plan Discussed with: CRNA and Anesthesiologist  Anesthesia Plan Comments:          Anesthesia Quick  Evaluation

## 2023-05-03 NOTE — ED Notes (Signed)
[facility] her to transport patient to [facility]. Patient is AAOx4, GCS 15, NAD. Patient has all personal belongings.

## 2023-05-03 NOTE — Plan of Care (Signed)
  Problem: Safety: Goal: Ability to remain free from injury will improve Outcome: Progressing   Problem: Skin Integrity: Goal: Risk for impaired skin integrity will decrease Outcome: Progressing   Problem: Pain Managment: Goal: General experience of comfort will improve Outcome: Progressing   Problem: Coping: Goal: Level of anxiety will decrease Outcome: Progressing   Problem: Clinical Measurements: Goal: Ability to maintain clinical measurements within normal limits will improve Outcome: Progressing   

## 2023-05-03 NOTE — TOC CM/SW Note (Signed)
Transition of Care [facility]) - Inpatient Brief Assessment   Patient Details  Name: Suzanne [name] MRN: [id number] Date of Birth: Dec 19, 1975  Transition of Care [facility]) CM/SW Contact:    Howell Rucks, RN Phone Number: 05/03/2023, 10:37 AM   Clinical Narrative: Met with pt at bedside to introduce role of TOC/NCM and review for dc planning. Pt confirms she has an established PCP and pharmacy, no current home care services, reports she has cane, walker and shower chair at home. Pt reports she feels safe returning home with support from her family, reports she has transportation available at discharge. TOC Brief Assessment completed. No TOC needs identified at this time.  SDOH review for Intimate Partner Violence, pt denies,  no intervention indicated at this time.     Transition of Care Asessment: Insurance and Status: Insurance coverage has been reviewed Patient has primary care physician: Yes Home environment has been reviewed: home with support from family Prior level of function:: Independent with use of cane/walker as needed Prior/Current Home Services: No current home services Social Determinants of Health Reivew: SDOH reviewed no interventions necessary Readmission risk has been reviewed: Yes Transition of care needs: no transition of care needs at this time

## 2023-05-03 NOTE — Anesthesia Procedure Notes (Signed)
Procedure Name: LMA Insertion Date/Time: 05/03/2023 4:35 PM  Performed by: Chinita Pester, CRNAPre-anesthesia Checklist: Patient identified, Emergency Drugs available, Suction available and Patient being monitored Patient Re-evaluated:Patient Re-evaluated prior to induction Oxygen Delivery Method: Circle System Utilized Preoxygenation: Pre-oxygenation with 100% oxygen Induction Type: IV induction Ventilation: Mask ventilation without difficulty LMA: LMA inserted LMA Size: 4.0 Number of attempts: 1 Airway Equipment and Method: Bite block Placement Confirmation: positive ETCO2 Tube secured with: Tape Dental Injury: Teeth and Oropharynx as per pre-operative assessment

## 2023-05-03 NOTE — Progress Notes (Signed)
PROGRESS NOTE    Chyrle Bobier  ION:[id number] DOB: 09/15/1975 DOA: 05/02/2023 PCP: Rometta Emery, MD     Brief Narrative:  Kalyne Dungee is a 47 y.o. 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   Back pain with radiculopathy has improved since her surgery but she woke early this morning with a different type of pain in the left flank and left groin that has become severe at times.  She has had nausea and loss of appetite associated with this but no vomiting or fever. CT demonstrates 14 mm left UVJ calculus with mild to moderate hydronephrosis.  Urology consulted.  New events last 24 hours / Subjective: Patient states that pain is much better after getting pain medication.  Awaiting urology consultation today.  Assessment & Plan:   Principal Problem:   AKI (acute kidney injury) (HCC) Active Problems:   Hypertension, benign   Left ureteral stone   Intractable pain   Hypokalemia   AKI with left ureteral stone -Baseline creatinine is 0.94 -UA is negative -Continue IV fluid, creatinine is improving -Urology consult pending -Pain control  Hypertension -Norvasc  Hypokalemia -Replace    DVT prophylaxis:  SCDs Start: 05/03/23 0252  Code Status: Full code Family Communication: None at bedside Disposition Plan: Home Status is: Inpatient Remains inpatient appropriate because: IV fluids, urology consult pending    Antimicrobials:  Anti-infectives (From admission, onward)    None        Objective: Vitals:   05/03/23 0226 05/03/23 0645 05/03/23 0846 05/03/23 1153  BP: (!) 156/94 (!) 158/94 (!) 153/90 (!) 165/105  Pulse: 69 73 77 80  Resp: 17 18 12    Temp: 99.1 F (37.3 C) 98.5 F (36.9 C) 98.4 F (36.9 C) 97.8 F (36.6 C)  TempSrc: Oral Oral Oral Oral  SpO2: 100% 100% 95% 98%  Weight:       Height:        Intake/Output Summary (Last 24 hours) at 05/03/2023 1329 Last data filed at 05/03/2023 0654 Gross per 24 hour  Intake 333.62 ml  Output 400 ml  Net -66.38 ml   Filed Weights   05/02/23 1909  Weight: 92.1 kg    Examination:  General exam: Appears calm and comfortable  Respiratory system: Clear to auscultation. Respiratory effort normal. No respiratory distress. No conversational dyspnea.  Cardiovascular system: S1 & S2 heard, RRR. No murmurs. No pedal edema. Gastrointestinal system: Abdomen is nondistended, soft and nontender. Normal bowel sounds heard. Central nervous system: Alert and oriented. No focal neurological deficits. Speech clear.  Extremities: Symmetric in appearance  Skin: No rashes, lesions or ulcers on exposed skin  Psychiatry: Judgement and insight appear normal. Mood & affect appropriate.   Data Reviewed: I have personally reviewed following labs and imaging studies  CBC: Recent Labs  Lab 05/02/23 2126 05/03/23 0402  WBC 12.0* 10.2  HGB 12.2 12.3  HCT 36.2 38.4  MCV 97.1 101.1*  PLT 301 305   Basic Metabolic Panel: Recent Labs  Lab 05/02/23 2126 05/03/23 0402  NA 140 137  K 3.1* 3.2*  CL 105 104  CO2 27 23  GLUCOSE 118* 139*  BUN 18 17  CREATININE 1.43* 1.28*  CALCIUM 9.3 9.1  MG  --  2.4   GFR: Estimated Creatinine Clearance: 62.1 mL/min (A) (by C-G formula based on SCr of 1.28 mg/dL (H)). Liver Function Tests: No results for input(s): "  AST", "ALT", "ALKPHOS", "BILITOT", "PROT", "ALBUMIN" in the last 168 hours. No results for input(s): "LIPASE", "AMYLASE" in the last 168 hours. No results for input(s): "AMMONIA" in the last 168 hours. Coagulation Profile: No results for input(s): "INR", "PROTIME" in the last 168 hours. Cardiac Enzymes: No results for input(s): "CKTOTAL", "CKMB", "CKMBINDEX", "TROPONINI" in the last 168 hours. BNP (last 3 results) No results for input(s): "PROBNP" in the last 8760 hours. HbA1C: No  results for input(s): "HGBA1C" in the last 72 hours. CBG: Recent Labs  Lab 04/27/23 1704 04/27/23 2148 04/28/23 0557 04/28/23 1226 05/03/23 1150  GLUCAP 205* 293* 188* 191* 101*   Lipid Profile: No results for input(s): "CHOL", "HDL", "LDLCALC", "TRIG", "CHOLHDL", "LDLDIRECT" in the last 72 hours. Thyroid Function Tests: No results for input(s): "TSH", "T4TOTAL", "FREET4", "T3FREE", "THYROIDAB" in the last 72 hours. Anemia Panel: No results for input(s): "VITAMINB12", "FOLATE", "FERRITIN", "TIBC", "IRON", "RETICCTPCT" in the last 72 hours. Sepsis Labs: No results for input(s): "PROCALCITON", "LATICACIDVEN" in the last 168 hours.  No results found for this or any previous visit (from the past 240 hour(s)).    Radiology Studies: CT Renal Stone Study  Result Date: 05/02/2023 CLINICAL DATA:  Abdominal pain. EXAM: CT ABDOMEN AND PELVIS WITHOUT CONTRAST TECHNIQUE: Multidetector CT imaging of the abdomen and pelvis was performed following the standard protocol without IV contrast. RADIATION DOSE REDUCTION: This exam was performed according to the departmental dose-optimization program which includes automated exposure control, adjustment of the mA and/or kV according to patient size and/or use of iterative reconstruction technique. COMPARISON:  CT abdomen pelvis dated 05/19/2022. FINDINGS: Evaluation of this exam is limited in the absence of intravenous contrast. Lower chest: The visualized lung bases are clear. No intra-abdominal free air or free fluid. Hepatobiliary: The liver is unremarkable. No biliary dilatation. The gallbladder is unremarkable. Pancreas: Unremarkable. No pancreatic ductal dilatation or surrounding inflammatory changes. Spleen: Normal in size without focal abnormality. Adrenals/Urinary Tract: The adrenal glands are unremarkable. Similar location of a 14 mm stone at the left ureteropelvic junction with mild to moderate left hydronephrosis. The degree of hydronephrosis is  similar or slightly increased since the prior CT. There is a 5 mm nonobstructing left renal inferior pole calculus. There is left perinephric stranding, nonspecific. Correlation with urinalysis recommended to exclude superimposed UTI. The right kidney is unremarkable. The visualized right ureter and urinary bladder appear unremarkable. Stomach/Bowel: There is no bowel obstruction or active inflammation. The appendix is normal. Vascular/Lymphatic: Mild aortoiliac atherosclerotic disease. The IVC is unremarkable. No portal venous gas. There is no adenopathy. Reproductive: The uterus is anteverted and grossly unremarkable. No adnexal masses. Other: None Musculoskeletal: No acute or significant osseous findings. IMPRESSION: 1. A 14 mm left UVJ calculus with mild to moderate hydronephrosis, minimally increased since the prior CT. 2. A 5 mm nonobstructing left renal inferior pole calculus. 3. No bowel obstruction. Normal appendix. 4.  Aortic Atherosclerosis (ICD10-I70.0). Electronically Signed   By: Elgie Collard M.D.   On: 05/02/2023 21:47      Scheduled Meds:  amLODipine  10 mg Oral Daily   insulin aspart  0-9 Units Subcutaneous Q4H   sodium chloride flush  3 mL Intravenous Q12H   Continuous Infusions:  lactated ringers 125 mL/hr at 05/03/23 1145     LOS: 0 days   Time spent: 25 minutes   Noralee Stain, DO [facility] 05/03/2023, 1:29 PM   Available via Epic secure chat 7am-7pm After these hours, please refer to coverage provider listed on [email or web address]

## 2023-05-03 NOTE — Plan of Care (Signed)
  Problem: Education: Goal: Knowledge of the prescribed therapeutic regimen will improve Outcome: Progressing Goal: Understanding of discharge needs will improve Outcome: Progressing   Problem: Activity: Goal: Ability to tolerate increased activity will improve Outcome: Progressing   Problem: Bowel/Gastric: Goal: Gastrointestinal status for postoperative course will improve Outcome: Progressing   Problem: Clinical Measurements: Goal: Postoperative complications will be avoided or minimized Outcome: Progressing   Problem: Pain Management: Goal: Pain level will decrease Outcome: Progressing   Problem: Activity: Goal: Risk for activity intolerance will decrease Outcome: Progressing   Problem: Nutrition: Goal: Adequate nutrition will be maintained Outcome: Progressing   Problem: Elimination: Goal: Will not experience complications related to urinary retention Outcome: Progressing   Problem: Pain Managment: Goal: General experience of comfort will improve Outcome: Progressing   Problem: Fluid Volume: Goal: Ability to maintain a balanced intake and output will improve Outcome: Progressing   Problem: Metabolic: Goal: Ability to maintain appropriate glucose levels will improve Outcome: Progressing   Problem: Nutritional: Goal: Maintenance of adequate nutrition will improve Outcome: Progressing   Problem: Education: Goal: Ability to verbalize activity precautions or restrictions will improve Outcome: Adequate for Discharge   Problem: Activity: Goal: Ability to avoid complications of mobility impairment will improve Outcome: Adequate for Discharge Goal: Will remain free from falls Outcome: Adequate for Discharge   Problem: Clinical Measurements: Goal: Ability to maintain clinical measurements within normal limits will improve Outcome: Adequate for Discharge   Problem: Skin Integrity: Goal: Will show signs of wound healing Outcome: Adequate for Discharge    Problem: Health Behavior/Discharge Planning: Goal: Identification of resources available to assist in meeting health care needs will improve Outcome: Adequate for Discharge   Problem: Clinical Measurements: Goal: Will remain free from infection Outcome: Adequate for Discharge Goal: Respiratory complications will improve Outcome: Adequate for Discharge Goal: Cardiovascular complication will be avoided Outcome: Adequate for Discharge   Problem: Coping: Goal: Level of anxiety will decrease Outcome: Adequate for Discharge   Problem: Safety: Goal: Ability to remain free from injury will improve Outcome: Adequate for Discharge   Problem: Skin Integrity: Goal: Risk for impaired skin integrity will decrease Outcome: Adequate for Discharge   Problem: Education: Goal: Ability to describe self-care measures that may prevent or decrease complications (Diabetes Survival Skills Education) will improve Outcome: Adequate for Discharge

## 2023-05-03 NOTE — Transfer of Care (Signed)
Immediate Anesthesia Transfer of Care Note  Patient: Suzanne [name]  Procedure(s) Performed: CYSTOSCOPY WITH RETROGRADE PYELOGRAM, URETEROSCOPY AND STENT PLACEMENT (Left: Ureter)  Patient Location: PACU  Anesthesia Type:General  Level of Consciousness: awake, drowsy, patient cooperative, and responds to stimulation  Airway & Oxygen Therapy: Patient Spontanous Breathing and Patient connected to face mask oxygen  Post-op Assessment: Report given to RN, Post -op Vital signs reviewed and stable, and Patient moving all extremities X 4  Post vital signs: Reviewed and stable  Last Vitals:  Vitals Value Taken Time  BP 140/96 05/03/23 1642  Temp    Pulse 100 05/03/23 1644  Resp 16 05/03/23 1644  SpO2 100 % 05/03/23 1644  Vitals shown include unfiled device data.  Last Pain:  Vitals:   05/03/23 1525  TempSrc:   PainSc: 10-Worst pain ever      Patients Stated Pain Goal: 4 (05/03/23 1506)  Complications: No notable events documented.

## 2023-05-03 NOTE — ED Notes (Signed)
ED TO INPATIENT HANDOFF REPORT  ED Nurse Name and Phone #: Lynnae Prude, RN [phone]   S Name/Age/Gender Suzanne [name] 47 y.o. female Room/Bed: DB009/DB009  Code Status   Code Status: Prior  Home/SNF/Other Home Patient oriented to: self, place, time, and situation Is this baseline? Yes   Triage Complete: Triage complete  Chief Complaint AKI (acute kidney injury) (HCC) [N17.9]  Triage Note Pt had surgery on a herniated disc earlier this week. Today pt was awakened from sleep with severe back pain with radiation to the front of her leg. Pt is tearful in triage.    Allergies Allergies  Allergen Reactions   Coconut Flavor Nausea Only    Other Reaction(s): GI Intolerance   Latex Itching   Pineapple Itching and Swelling    Tongue swelling   Tomato Itching and Swelling    Tongue swelling.     Level of Care/Admitting Diagnosis ED Disposition     ED Disposition  Admit   Condition  --   Comment  Hospital Area: [facility]  HOSPITAL [[id number]]  Level of Care: Telemetry [5]  Admit to tele based on following criteria: Monitor for Ischemic changes  Interfacility transfer: Yes  May place patient in observation at [facility] or [facility] if equivalent level of care is available:: No  Covid Evaluation: Asymptomatic - no recent exposure (last 10 days) testing not required  Diagnosis: AKI (acute kidney injury) [facility]) [[id number]]  Admitting Physician: Angie Fava [[id number]]  Attending Physician: Angie Fava [[id number]]          B Medical/Surgery History Past Medical History:  Diagnosis Date   Anemia    Diabetes mellitus without complication (HCC)    GERD (gastroesophageal reflux disease)    Headache    History of kidney stones    Hypertension    Sciatica    Past Surgical History:  Procedure Laterality Date   CERVICAL BIOPSY     CESAREAN SECTION N/A 05/15/2016   Procedure: CESAREAN SECTION;  Surgeon: Tilda Burrow, MD;  Location: [facility] BIRTHING  SUITES;  Service: Obstetrics;  Laterality: N/A;   COLPOSCOPY     LUMBAR LAMINECTOMY/ DECOMPRESSION WITH MET-RX Left 10/19/2022   Procedure: Left Lumbar five-Sacral one Microlaminectomy and discectomy;  Surgeon: Jadene Pierini, MD;  Location: [facility] OR;  Service: Neurosurgery;  Laterality: Left;   LUMBAR LAMINECTOMY/ DECOMPRESSION WITH MET-RX Left 04/27/2023   Procedure: Revision Left Minimally Invasive Lumbar Five-Sacral One Discectomy;  Surgeon: Jadene Pierini, MD;  Location: [facility] OR;  Service: Neurosurgery;  Laterality: Left;  3C     A IV Location/Drains/Wounds Patient Lines/Drains/Airways Status     Active Line/Drains/Airways     Name Placement date Placement time Site Days   Peripheral IV 05/02/23 20 G 1" Left Antecubital 05/02/23  1925  Antecubital  1            Intake/Output Last 24 hours No intake or output data in the 24 hours ending 05/03/23 0029  Labs/Imaging Results for orders placed or performed during the hospital encounter of 05/02/23 (from the past 48 hour(s))  Basic metabolic panel     Status: Abnormal   Collection Time: 05/02/23  9:26 PM  Result Value Ref Range   Sodium 140 135 - 145 mmol/L   Potassium 3.1 (L) 3.5 - 5.1 mmol/L   Chloride 105 98 - 111 mmol/L   CO2 27 22 - 32 mmol/L   Glucose, Bld 118 (H) 70 - 99 mg/dL    Comment: Glucose reference range  applies only to samples taken after fasting for at least 8 hours.   BUN 18 6 - 20 mg/dL   Creatinine, Ser 5.40 (H) 0.44 - 1.00 mg/dL   Calcium 9.3 8.9 - 98.1 mg/dL   GFR, Estimated 46 (L) >60 mL/min    Comment: (NOTE) Calculated using the CKD-EPI Creatinine Equation (2021)    Anion gap 8 5 - 15    Comment: Performed at [facility], [address], [place], [place] [address]  CBC     Status: Abnormal   Collection Time: 05/02/23  9:26 PM  Result Value Ref Range   WBC 12.0 (H) 4.0 - 10.5 K/uL   RBC 3.73 (L) 3.87 - 5.11 MIL/uL   Hemoglobin 12.2 12.0 - 15.0 g/dL   HCT 82.9 56.2 -  13.0 %   MCV 97.1 80.0 - 100.0 fL   MCH 32.7 26.0 - 34.0 pg   MCHC 33.7 30.0 - 36.0 g/dL   RDW 86.5 78.4 - 69.6 %   Platelets 301 150 - 400 K/uL   nRBC 0.0 0.0 - 0.2 %    Comment: Performed at [facility], [address] [place], [place] [address]  Urinalysis, Routine w reflex microscopic -Urine, Clean Catch     Status: Abnormal   Collection Time: 05/02/23  9:35 PM  Result Value Ref Range   Color, Urine YELLOW YELLOW   APPearance HAZY (A) CLEAR   Specific Gravity, Urine 1.021 1.005 - 1.030   pH 7.0 5.0 - 8.0   Glucose, UA NEGATIVE NEGATIVE mg/dL   Hgb urine dipstick NEGATIVE NEGATIVE   Bilirubin Urine NEGATIVE NEGATIVE   Ketones, ur NEGATIVE NEGATIVE mg/dL   Protein, ur NEGATIVE NEGATIVE mg/dL   Nitrite NEGATIVE NEGATIVE   Leukocytes,Ua NEGATIVE NEGATIVE   RBC / HPF 0-5 0 - 5 RBC/hpf   Bacteria, UA RARE (A) NONE SEEN   Squamous Epithelial / HPF 0-5 0 - 5 /HPF   Mucus PRESENT     Comment: Performed at [facility], [address], [place], [place] [address]  Pregnancy, urine     Status: None   Collection Time: 05/02/23  9:35 PM  Result Value Ref Range   Preg Test, Ur NEGATIVE NEGATIVE    Comment:        THE SENSITIVITY OF THIS METHODOLOGY IS >25 mIU/mL. Performed at [facility], [address], [place], [place] [address]    CT Renal Stone Study  Result Date: 05/02/2023 CLINICAL DATA:  Abdominal pain. EXAM: CT ABDOMEN AND PELVIS WITHOUT CONTRAST TECHNIQUE: Multidetector CT imaging of the abdomen and pelvis was performed following the standard protocol without IV contrast. RADIATION DOSE REDUCTION: This exam was performed according to the departmental dose-optimization program which includes automated exposure control, adjustment of the mA and/or kV according to patient size and/or use of iterative reconstruction technique. COMPARISON:  CT abdomen pelvis dated 05/19/2022. FINDINGS: Evaluation of this exam is limited in  the absence of intravenous contrast. Lower chest: The visualized lung bases are clear. No intra-abdominal free air or free fluid. Hepatobiliary: The liver is unremarkable. No biliary dilatation. The gallbladder is unremarkable. Pancreas: Unremarkable. No pancreatic ductal dilatation or surrounding inflammatory changes. Spleen: Normal in size without focal abnormality. Adrenals/Urinary Tract: The adrenal glands are unremarkable. Similar location of a 14 mm stone at the left ureteropelvic junction with mild to moderate left hydronephrosis. The degree of hydronephrosis is similar or slightly increased since the prior CT. There is a 5 mm nonobstructing left renal inferior  pole calculus. There is left perinephric stranding, nonspecific. Correlation with urinalysis recommended to exclude superimposed UTI. The right kidney is unremarkable. The visualized right ureter and urinary bladder appear unremarkable. Stomach/Bowel: There is no bowel obstruction or active inflammation. The appendix is normal. Vascular/Lymphatic: Mild aortoiliac atherosclerotic disease. The IVC is unremarkable. No portal venous gas. There is no adenopathy. Reproductive: The uterus is anteverted and grossly unremarkable. No adnexal masses. Other: None Musculoskeletal: No acute or significant osseous findings. IMPRESSION: 1. A 14 mm left UVJ calculus with mild to moderate hydronephrosis, minimally increased since the prior CT. 2. A 5 mm nonobstructing left renal inferior pole calculus. 3. No bowel obstruction. Normal appendix. 4.  Aortic Atherosclerosis (ICD10-I70.0). Electronically Signed   By: Elgie Collard M.D.   On: 05/02/2023 21:47    Pending Labs Unresulted Labs (From admission, onward)    None       Vitals/Pain Today's Vitals   05/02/23 2126 05/02/23 2200 05/02/23 2300 05/02/23 2339  BP:   (!) 177/102   Pulse:   85   Resp:   20   Temp:    99 F (37.2 C)  TempSrc:    Oral  SpO2:   99%   Weight:      Height:      PainSc:  10-Worst pain ever 5       Isolation Precautions No active isolations  Medications Medications  fentaNYL (SUBLIMAZE) injection 50 mcg (50 mcg Intravenous Given 05/02/23 1928)  ondansetron (ZOFRAN) injection 4 mg (4 mg Intravenous Given 05/02/23 1925)  HYDROmorphone (DILAUDID) injection 0.5 mg (0.5 mg Intravenous Given 05/02/23 2006)  hyoscyamine (LEVSIN SL) SL tablet 0.125 mg (0.125 mg Oral Given 05/02/23 2131)  HYDROmorphone (DILAUDID) injection 1 mg (1 mg Intravenous Given 05/02/23 2131)  ketorolac (TORADOL) 30 MG/ML injection 15 mg (15 mg Intravenous Given 05/02/23 2336)  0.9 %  sodium chloride infusion ( Intravenous New Bag/Given 05/03/23 0025)    Mobility walks     Focused Assessments GU   R Recommendations: See Admitting Provider Note  Report given to:   Additional Notes: Patient c/o back pain with abdominal pain. Disc surgery last week and being tx for kidney stone.

## 2023-05-03 NOTE — ED Provider Notes (Incomplete)
[place] EMERGENCY DEPARTMENT AT [facility] Provider Note   CSN: [id number] Arrival date & time: 05/02/23  1858     History {Add pertinent medical, surgical, social history, OB history to HPI:1} Chief Complaint  Patient presents with  . Post-op Problem    Suzanne [name] is a 47 y.o. female.  47 year old female who presents emergency department chief complaint of left flank pain.  Patient had onset of severe pain in her left lower abdominal quadrant quadrant radiating into her left labia that woke her out of her sleep this morning.  Pain is severe, sharp, stabbing "like an ice pick."  Patient had similar symptoms about a year ago when she was diagnosed with a kidney stone.  She has had associated nausea without vomiting.  She is status post microdiscectomy at L4-L5 by Dr. Johnsie Cancel on Tuesday of this past week.    The history is provided by the patient and medical records.  Flank Pain This is a recurrent problem. The current episode started 6 to 12 hours ago. The problem occurs constantly. The problem has not changed since onset.      Home Medications Prior to Admission medications   Medication Sig Start Date End Date Taking? Authorizing Provider  amLODipine (NORVASC) 10 MG tablet Take 1 tablet (10 mg total) by mouth daily. 08/18/17   Marny Lowenstein, PA-C  aspirin 81 MG chewable tablet Chew 81 mg by mouth in the morning.    [provider]  gabapentin (NEURONTIN) 300 MG capsule Take 300 mg by mouth 3 (three) times daily.    [provider]  hydrALAZINE (APRESOLINE) 10 MG tablet Take 1 tablet (10 mg total) by mouth in the morning and at bedtime. 06/26/22 10/13/23  Terald Sleeper, MD  metFORMIN (GLUCOPHAGE) 500 MG tablet Take 1 tablet (500 mg total) by mouth 2 (two) times daily with a meal. 03/30/22 10/13/23  Curatolo, Adam, DO  omeprazole (PRILOSEC) 20 MG capsule Take 20 mg by mouth in the morning.    [provider]  oxyCODONE (OXY  IR/ROXICODONE) 5 MG immediate release tablet Take 1 tablet (5 mg total) by mouth every 4 (four) hours as needed (pain). 04/28/23   Jadene Pierini, MD      Allergies    Coconut flavor, Latex, Pineapple, and Tomato    Review of Systems   Review of Systems  Genitourinary:  Positive for flank pain.    Physical Exam Updated Vital Signs BP (!) 143/89   Pulse 72   Temp 97.7 F (36.5 C)   Resp 18   Ht 5\' 6"  (1.676 m)   Wt 92.1 kg   SpO2 100%   BMI 32.77 kg/m  Physical Exam  ED Results / Procedures / Treatments   Labs (all labs ordered are listed, but only abnormal results are displayed) Labs Reviewed  CBC - Abnormal; Notable for the following components:      Result Value   WBC 12.0 (*)    RBC 3.73 (*)    All other components within normal limits  URINALYSIS, ROUTINE W REFLEX MICROSCOPIC  BASIC METABOLIC PANEL  PREGNANCY, URINE    EKG None  Radiology CT Renal Stone Study  Result Date: 05/02/2023 CLINICAL DATA:  Abdominal pain. EXAM: CT ABDOMEN AND PELVIS WITHOUT CONTRAST TECHNIQUE: Multidetector CT imaging of the abdomen and pelvis was performed following the standard protocol without IV contrast. RADIATION DOSE REDUCTION: This exam was performed according to the departmental dose-optimization program which includes automated exposure control, adjustment of  the mA and/or kV according to patient size and/or use of iterative reconstruction technique. COMPARISON:  CT abdomen pelvis dated 05/19/2022. FINDINGS: Evaluation of this exam is limited in the absence of intravenous contrast. Lower chest: The visualized lung bases are clear. No intra-abdominal free air or free fluid. Hepatobiliary: The liver is unremarkable. No biliary dilatation. The gallbladder is unremarkable. Pancreas: Unremarkable. No pancreatic ductal dilatation or surrounding inflammatory changes. Spleen: Normal in size without focal abnormality. Adrenals/Urinary Tract: The adrenal glands are unremarkable. Similar  location of a 14 mm stone at the left ureteropelvic junction with mild to moderate left hydronephrosis. The degree of hydronephrosis is similar or slightly increased since the prior CT. There is a 5 mm nonobstructing left renal inferior pole calculus. There is left perinephric stranding, nonspecific. Correlation with urinalysis recommended to exclude superimposed UTI. The right kidney is unremarkable. The visualized right ureter and urinary bladder appear unremarkable. Stomach/Bowel: There is no bowel obstruction or active inflammation. The appendix is normal. Vascular/Lymphatic: Mild aortoiliac atherosclerotic disease. The IVC is unremarkable. No portal venous gas. There is no adenopathy. Reproductive: The uterus is anteverted and grossly unremarkable. No adnexal masses. Other: None Musculoskeletal: No acute or significant osseous findings. IMPRESSION: 1. A 14 mm left UVJ calculus with mild to moderate hydronephrosis, minimally increased since the prior CT. 2. A 5 mm nonobstructing left renal inferior pole calculus. 3. No bowel obstruction. Normal appendix. 4.  Aortic Atherosclerosis (ICD10-I70.0). Electronically Signed   By: Elgie Collard M.D.   On: 05/02/2023 21:47    Procedures Procedures  {Document cardiac monitor, telemetry assessment procedure when appropriate:1}  Medications Ordered in ED Medications  fentaNYL (SUBLIMAZE) injection 50 mcg (50 mcg Intravenous Given 05/02/23 1928)  ondansetron (ZOFRAN) injection 4 mg (4 mg Intravenous Given 05/02/23 1925)  HYDROmorphone (DILAUDID) injection 0.5 mg (0.5 mg Intravenous Given 05/02/23 2006)  hyoscyamine (LEVSIN SL) SL tablet 0.125 mg (0.125 mg Oral Given 05/02/23 2131)  HYDROmorphone (DILAUDID) injection 1 mg (1 mg Intravenous Given 05/02/23 2131)    ED Course/ Medical Decision Making/ A&P Clinical Course as of 05/03/23 0003  Sun May 02, 2023  2158 I reviewed the patient's CT scan which shows a 14 mm proximal left-sided ureteral stone.  Previous  CT from a year ago on May 19, 2022 shows a similar stone in the same region.  I asked patient if she had followed up with urology.  She states she saw urology with [facility] and they were concerned about her low potassium levels.  She was told at that time that "it may just break up and pass on its own."  She has not had pain until today. [AH]  2245 Potassium(!): 3.1 [AH]  2246 WBC(!): 12.0 [AH]  2246 Urinalysis, Routine w reflex microscopic -Urine, Clean Catch(!) [AH]  2246 CT Renal [place] [AH]  2334 Consult with Dr. Mena Goes.Patient will need admission for intractable pain and AKI. Urology will consult n the morning  [AH]    Clinical Course User Index [AH] Arthor Captain, PA-C   {   Click here for ABCD2, HEART and other calculatorsREFRESH Note before signing :1}                              Medical Decision Making Amount and/or Complexity of Data Reviewed Labs: ordered. Decision-making details documented in ED Course. Radiology: ordered. Decision-making details documented in ED Course.  Risk Prescription drug management. Decision regarding hospitalization.   ***  {Document  critical care time when appropriate:1} {Document review of labs and clinical decision tools ie heart score, Chads2Vasc2 etc:1}  {Document your independent review of radiology images, and any outside records:1} {Document your discussion with family members, caretakers, and with consultants:1} {Document social determinants of health affecting pt's care:1} {Document your decision making why or why not admission, treatments were needed:1} Final Clinical Impression(s) / ED Diagnoses Final diagnoses:  None    Rx / DC Orders ED Discharge Orders     None

## 2023-05-03 NOTE — Progress Notes (Signed)
Patient denies Intimate Partner Violence, no intervention indicated

## 2023-05-03 NOTE — Consult Note (Signed)
Urology Consult Note   Requesting Attending Physician:  Noralee Stain, DO Service Providing Consult: Urology  Consulting Attending: Dr. Margo Aye   Reason for Consult:  left flank pain  HPI: Suzanne [name] is seen in consultation for reasons noted above at the request of Noralee Stain, DO.  Patient recently underwent L5-S1 MIS on 04/27/2023.  She was discharged the following day without complication.  She presents to the [facility] emergency department on 05/02/2023 with complaint of a severe sharp stabbing pain in her left flank radiating around to her left lower abdomen and into her left labia, awaking from her sleep.  Patient was diagnosed sometime ago with a left UPJ stone.  She was seen by [facility] most recently.  She has not experienced any discomfort from the stone for over a year and believe that it had passed.  On my assessment patient was alert, oriented, and in no distress.  Her pain was well-controlled but localized to the same areas on her initial presentation.  ------------------  Assessment:  47 y.o. female with 14 mm left UPJ stone   Recommendations: # Left UPJ stone Hydronephrosis is mild and relatively unchanged compared to previous imaging.  Renal indices are improving with hydration.  Since it has been a year, and the kidney appears viable, the stone is likely ball-valving. Considering she had lower spine surgery last week, I would not recommend shockwave therapy at this time.  Will proceed with ureteral stent placement this evening and laser lithotripsy on an outpatient basis. Remain n.p.o. Agree with IV fluids.  Trend labs.  Case and plan discussed with Dr. Margo Aye  Past Medical History: Past Medical History:  Diagnosis Date   Anemia    Diabetes mellitus without complication (HCC)    GERD (gastroesophageal reflux disease)    Headache    History of kidney stones    Hypertension    Sciatica     Past Surgical History:  Past Surgical History:   Procedure Laterality Date   CERVICAL BIOPSY     CESAREAN SECTION N/A 05/15/2016   Procedure: CESAREAN SECTION;  Surgeon: Tilda Burrow, MD;  Location: [facility] BIRTHING SUITES;  Service: Obstetrics;  Laterality: N/A;   COLPOSCOPY     LUMBAR LAMINECTOMY/ DECOMPRESSION WITH MET-RX Left 10/19/2022   Procedure: Left Lumbar five-Sacral one Microlaminectomy and discectomy;  Surgeon: Jadene Pierini, MD;  Location: [facility] OR;  Service: Neurosurgery;  Laterality: Left;   LUMBAR LAMINECTOMY/ DECOMPRESSION WITH MET-RX Left 04/27/2023   Procedure: Revision Left Minimally Invasive Lumbar Five-Sacral One Discectomy;  Surgeon: Jadene Pierini, MD;  Location: [facility] OR;  Service: Neurosurgery;  Laterality: Left;  3C    Medication: Current Facility-Administered Medications  Medication Dose Route Frequency Provider Last Rate Last Admin   acetaminophen (TYLENOL) tablet 650 mg  650 mg Oral Q6H PRN Opyd, Lavone Neri, MD       Or   acetaminophen (TYLENOL) suppository 650 mg  650 mg Rectal Q6H PRN Opyd, Lavone Neri, MD       amLODipine (NORVASC) tablet 10 mg  10 mg Oral Daily Opyd, Lavone Neri, MD   10 mg at 05/03/23 0848   HYDROmorphone (DILAUDID) injection 0.5-1 mg  0.5-1 mg Intravenous Q3H PRN Opyd, Lavone Neri, MD   1 mg at 05/03/23 1145   insulin aspart (novoLOG) injection 0-9 Units  0-9 Units Subcutaneous Q4H Noralee Stain, DO       lactated ringers infusion   Intravenous Continuous Opyd, Lavone Neri, MD 125 mL/hr at 05/03/23 1145  New Bag at 05/03/23 1145   methocarbamol (ROBAXIN) tablet 500 mg  500 mg Oral Q6H PRN Opyd, Lavone Neri, MD       ondansetron (ZOFRAN) tablet 4 mg  4 mg Oral Q6H PRN Opyd, Lavone Neri, MD       Or   ondansetron (ZOFRAN) injection 4 mg  4 mg Intravenous Q6H PRN Opyd, Lavone Neri, MD       Oral care mouth rinse  15 mL Mouth Rinse PRN Noralee Stain, DO       oxyCODONE (Oxy IR/ROXICODONE) immediate release tablet 5 mg  5 mg Oral Q4H PRN Opyd, Lavone Neri, MD       senna-docusate (Senokot-S) tablet 1  tablet  1 tablet Oral QHS PRN Opyd, Lavone Neri, MD       sodium chloride flush (NS) 0.9 % injection 3 mL  3 mL Intravenous Q12H Opyd, Lavone Neri, MD   3 mL at 05/03/23 0940    Allergies: Allergies  Allergen Reactions   Coconut Flavor Nausea Only and Other (See Comments)     GI Intolerance   Latex Itching   Pineapple Itching and Swelling    Tongue swelling   Tomato Itching and Swelling    Tongue swelling.     Social History: Social History   Tobacco Use   Smoking status: Every Day    Current packs/day: 0.25    Average packs/day: 0.3 packs/day for 15.0 years (3.8 ttl pk-yrs)    Types: Cigarettes   Smokeless tobacco: Never  Vaping Use   Vaping status: Never Used  Substance Use Topics   Alcohol use: No    Comment: rare   Drug use: Yes    Frequency: 1.0 times per week    Types: Marijuana    Comment: smoke and gummies    Family History Family History  Problem Relation Age of Onset   Diabetes Mother    Hypertension Mother    GER disease Mother     Review of Systems  Genitourinary:  Positive for flank pain.     Objective   Vital signs in last 24 hours: BP (!) 165/105 (BP Location: Right Arm)   Pulse 80   Temp 97.8 F (36.6 C) (Oral)   Resp 12   Ht 5\' 6"  (1.676 m)   Wt 92.1 kg   SpO2 98%   BMI 32.77 kg/m   Physical Exam General: NAD, A&O, resting, appropriate HEENT: [place]/AT Pulmonary: Normal work of breathing Cardiovascular: RRR, no cyanosis Abdomen: Soft, NTTP, nondistended Neuro: Appropriate, no focal neurological deficits  Most Recent Labs: Lab Results  Component Value Date   WBC 10.2 05/03/2023   HGB 12.3 05/03/2023   HCT 38.4 05/03/2023   PLT 305 05/03/2023    Lab Results  Component Value Date   NA 137 05/03/2023   K 3.2 (L) 05/03/2023   CL 104 05/03/2023   CO2 23 05/03/2023   BUN 17 05/03/2023   CREATININE 1.28 (H) 05/03/2023   CALCIUM 9.1 05/03/2023   MG 2.4 05/03/2023    No results found for: "INR", "APTT"   Urine  Culture: @[id number] (laburin,org,[id number],[id number])@   IMAGING: CT Renal Stone Study  Result Date: 05/02/2023 CLINICAL DATA:  Abdominal pain. EXAM: CT ABDOMEN AND PELVIS WITHOUT CONTRAST TECHNIQUE: Multidetector CT imaging of the abdomen and pelvis was performed following the standard protocol without IV contrast. RADIATION DOSE REDUCTION: This exam was performed according to the departmental dose-optimization program which includes automated exposure control, adjustment of the mA and/or kV according to  patient size and/or use of iterative reconstruction technique. COMPARISON:  CT abdomen pelvis dated 05/19/2022. FINDINGS: Evaluation of this exam is limited in the absence of intravenous contrast. Lower chest: The visualized lung bases are clear. No intra-abdominal free air or free fluid. Hepatobiliary: The liver is unremarkable. No biliary dilatation. The gallbladder is unremarkable. Pancreas: Unremarkable. No pancreatic ductal dilatation or surrounding inflammatory changes. Spleen: Normal in size without focal abnormality. Adrenals/Urinary Tract: The adrenal glands are unremarkable. Similar location of a 14 mm stone at the left ureteropelvic junction with mild to moderate left hydronephrosis. The degree of hydronephrosis is similar or slightly increased since the prior CT. There is a 5 mm nonobstructing left renal inferior pole calculus. There is left perinephric stranding, nonspecific. Correlation with urinalysis recommended to exclude superimposed UTI. The right kidney is unremarkable. The visualized right ureter and urinary bladder appear unremarkable. Stomach/Bowel: There is no bowel obstruction or active inflammation. The appendix is normal. Vascular/Lymphatic: Mild aortoiliac atherosclerotic disease. The IVC is unremarkable. No portal venous gas. There is no adenopathy. Reproductive: The uterus is anteverted and grossly unremarkable. No adnexal masses. Other: None Musculoskeletal: No acute or significant  osseous findings. IMPRESSION: 1. A 14 mm left UVJ calculus with mild to moderate hydronephrosis, minimally increased since the prior CT. 2. A 5 mm nonobstructing left renal inferior pole calculus. 3. No bowel obstruction. Normal appendix. 4.  Aortic Atherosclerosis (ICD10-I70.0). Electronically Signed   By: Elgie Collard M.D.   On: 05/02/2023 21:47    ------  Elmon Kirschner, NP Pager: [phone]   Please contact the urology consult pager with any further questions/concerns.

## 2023-05-03 NOTE — H&P (Signed)
History and Physical    Suzanne [name] GNF:[id number] DOB: 1976/07/25 DOA: 05/02/2023  PCP: Rometta Emery, MD   Patient coming from: Home   Chief Complaint: Left flank pain   HPI: Suzanne [name] is a 47 y.o. 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  Back pain with radiculopathy has improved since her surgery but she woke early this morning with a different type of pain in the left flank and left groin that has become severe at times.  She has had nausea and loss of appetite associated with this but no vomiting or fever.  [facility] ED Course: Upon arrival to the ED, patient is found to be afebrile and saturating well on room air with normal heart rate and elevated blood pressure.  Labs are most notable for potassium 3.1, creatinine 1.43, and WBC 12,000.  CT demonstrates 14 mm left UVJ calculus with mild to moderate hydronephrosis.  Urology (Dr. Mena Goes) was consulted by the ED PA and recommended medical admission to [facility] with the patient be n.p.o. after midnight.  Patient was treated with 2 doses of Dilaudid, fentanyl, Toradol, and Zofran.  She was transferred to [facility] for admission  Review of Systems:  All other systems reviewed and apart from HPI, are negative.  Past Medical History:  Diagnosis Date   Anemia    Diabetes mellitus without complication (HCC)    GERD (gastroesophageal reflux disease)    Headache    History of kidney stones    Hypertension    Sciatica     Past Surgical History:  Procedure Laterality Date   CERVICAL BIOPSY     CESAREAN SECTION N/A 05/15/2016   Procedure: CESAREAN SECTION;  Surgeon: Tilda Burrow, MD;  Location: [facility] BIRTHING SUITES;  Service: Obstetrics;  Laterality: N/A;   COLPOSCOPY     LUMBAR LAMINECTOMY/ DECOMPRESSION WITH MET-RX Left 10/19/2022    Procedure: Left Lumbar five-Sacral one Microlaminectomy and discectomy;  Surgeon: Jadene Pierini, MD;  Location: [facility] OR;  Service: Neurosurgery;  Laterality: Left;   LUMBAR LAMINECTOMY/ DECOMPRESSION WITH MET-RX Left 04/27/2023   Procedure: Revision Left Minimally Invasive Lumbar Five-Sacral One Discectomy;  Surgeon: Jadene Pierini, MD;  Location: [facility] OR;  Service: Neurosurgery;  Laterality: Left;  3C    Social History:   reports that she has been smoking cigarettes. She has a 3.8 pack-year smoking history. She has never used smokeless tobacco. She reports current drug use. Frequency: 1.00 time per week. Drug: Marijuana. She reports that she does not drink alcohol.  Allergies  Allergen Reactions   Coconut Flavor Nausea Only    Other Reaction(s): GI Intolerance   Latex Itching   Pineapple Itching and Swelling    Tongue swelling   Tomato Itching and Swelling    Tongue swelling.     Family History  Problem Relation Age of Onset   Diabetes Mother    Hypertension Mother    GER disease Mother      Prior to Admission medications   Medication Sig Start Date End Date Taking? Authorizing Provider  amLODipine (NORVASC) 10 MG tablet Take 1 tablet (10 mg total) by mouth daily. 08/18/17   Marny Lowenstein, PA-C  aspirin 81 MG chewable tablet Chew 81 mg by mouth in the morning.    [provider]  gabapentin (NEURONTIN) 300 MG capsule Take 300 mg by mouth 3 (three) times  daily.    [provider]  hydrALAZINE (APRESOLINE) 10 MG tablet Take 1 tablet (10 mg total) by mouth in the morning and at bedtime. 06/26/22 10/13/23  Terald Sleeper, MD  metFORMIN (GLUCOPHAGE) 500 MG tablet Take 1 tablet (500 mg total) by mouth 2 (two) times daily with a meal. 03/30/22 10/13/23  Curatolo, Adam, DO  omeprazole (PRILOSEC) 20 MG capsule Take 20 mg by mouth in the morning.    [provider]  oxyCODONE (OXY IR/ROXICODONE) 5 MG immediate release tablet Take 1 tablet (5 mg total) by  mouth every 4 (four) hours as needed (pain). 04/28/23   Jadene Pierini, MD    Physical Exam: Vitals:   05/03/23 0030 05/03/23 0130 05/03/23 0152 05/03/23 0226  BP: (!) 162/99 (!) 165/99 137/86 (!) 156/94  Pulse: 79 63 64 69  Resp: 18 16 16 17   Temp:    99.1 F (37.3 C)  TempSrc:    Oral  SpO2: 99% 100% 100% 100%  Weight:      Height:         Constitutional: NAD, calm  Eyes: PERTLA, lids and conjunctivae normal ENMT: Mucous membranes are moist. Posterior pharynx clear of any exudate or lesions.   Neck: supple, no masses  Respiratory: no wheezing, no crackles. No accessory muscle use.  Cardiovascular: S1 & S2 heard, regular rate and rhythm. No extremity edema.   Abdomen: Soft, no distension. Bowel sounds active.  Musculoskeletal: no clubbing / cyanosis. No joint deformity upper and lower extremities.   Skin: no significant rashes, lesions, ulcers. Warm, dry, well-perfused. Neurologic: CN 2-12 grossly intact. Moving all extremities. Alert and oriented.  Psychiatric: Pleasant. Cooperative.    Labs and Imaging on Admission: I have personally reviewed following labs and imaging studies  CBC: Recent Labs  Lab 05/02/23 2126  WBC 12.0*  HGB 12.2  HCT 36.2  MCV 97.1  PLT 301   Basic Metabolic Panel: Recent Labs  Lab 05/02/23 2126  NA 140  K 3.1*  CL 105  CO2 27  GLUCOSE 118*  BUN 18  CREATININE 1.43*  CALCIUM 9.3   GFR: Estimated Creatinine Clearance: 55.6 mL/min (A) (by C-G formula based on SCr of 1.43 mg/dL (H)). Liver Function Tests: No results for input(s): "AST", "ALT", "ALKPHOS", "BILITOT", "PROT", "ALBUMIN" in the last 168 hours. No results for input(s): "LIPASE", "AMYLASE" in the last 168 hours. No results for input(s): "AMMONIA" in the last 168 hours. Coagulation Profile: No results for input(s): "INR", "PROTIME" in the last 168 hours. Cardiac Enzymes: No results for input(s): "CKTOTAL", "CKMB", "CKMBINDEX", "TROPONINI" in the last 168 hours. BNP  (last 3 results) No results for input(s): "PROBNP" in the last 8760 hours. HbA1C: No results for input(s): "HGBA1C" in the last 72 hours. CBG: Recent Labs  Lab 04/27/23 1312 04/27/23 1704 04/27/23 2148 04/28/23 0557 04/28/23 1226  GLUCAP 145* 205* 293* 188* 191*   Lipid Profile: No results for input(s): "CHOL", "HDL", "LDLCALC", "TRIG", "CHOLHDL", "LDLDIRECT" in the last 72 hours. Thyroid Function Tests: No results for input(s): "TSH", "T4TOTAL", "FREET4", "T3FREE", "THYROIDAB" in the last 72 hours. Anemia Panel: No results for input(s): "VITAMINB12", "FOLATE", "FERRITIN", "TIBC", "IRON", "RETICCTPCT" in the last 72 hours. Urine analysis:    Component Value Date/Time   COLORURINE YELLOW 05/02/2023 2135   APPEARANCEUR HAZY (A) 05/02/2023 2135   LABSPEC 1.021 05/02/2023 2135   PHURINE 7.0 05/02/2023 2135   GLUCOSEU NEGATIVE 05/02/2023 2135   HGBUR NEGATIVE 05/02/2023 2135   BILIRUBINUR NEGATIVE 05/02/2023 2135  KETONESUR NEGATIVE 05/02/2023 2135   PROTEINUR NEGATIVE 05/02/2023 2135   UROBILINOGEN 0.2 04/17/2016 0912   NITRITE NEGATIVE 05/02/2023 2135   LEUKOCYTESUR NEGATIVE 05/02/2023 2135   Sepsis Labs: @LABRCNTIP (procalcitonin:4,lacticidven:4) )No results found for this or any previous visit (from the past 240 hour(s)).   Radiological Exams on Admission: CT Renal Stone Study  Result Date: 05/02/2023 CLINICAL DATA:  Abdominal pain. EXAM: CT ABDOMEN AND PELVIS WITHOUT CONTRAST TECHNIQUE: Multidetector CT imaging of the abdomen and pelvis was performed following the standard protocol without IV contrast. RADIATION DOSE REDUCTION: This exam was performed according to the departmental dose-optimization program which includes automated exposure control, adjustment of the mA and/or kV according to patient size and/or use of iterative reconstruction technique. COMPARISON:  CT abdomen pelvis dated 05/19/2022. FINDINGS: Evaluation of this exam is limited in the absence of  intravenous contrast. Lower chest: The visualized lung bases are clear. No intra-abdominal free air or free fluid. Hepatobiliary: The liver is unremarkable. No biliary dilatation. The gallbladder is unremarkable. Pancreas: Unremarkable. No pancreatic ductal dilatation or surrounding inflammatory changes. Spleen: Normal in size without focal abnormality. Adrenals/Urinary Tract: The adrenal glands are unremarkable. Similar location of a 14 mm stone at the left ureteropelvic junction with mild to moderate left hydronephrosis. The degree of hydronephrosis is similar or slightly increased since the prior CT. There is a 5 mm nonobstructing left renal inferior pole calculus. There is left perinephric stranding, nonspecific. Correlation with urinalysis recommended to exclude superimposed UTI. The right kidney is unremarkable. The visualized right ureter and urinary bladder appear unremarkable. Stomach/Bowel: There is no bowel obstruction or active inflammation. The appendix is normal. Vascular/Lymphatic: Mild aortoiliac atherosclerotic disease. The IVC is unremarkable. No portal venous gas. There is no adenopathy. Reproductive: The uterus is anteverted and grossly unremarkable. No adnexal masses. Other: None Musculoskeletal: No acute or significant osseous findings. IMPRESSION: 1. A 14 mm left UVJ calculus with mild to moderate hydronephrosis, minimally increased since the prior CT. 2. A 5 mm nonobstructing left renal inferior pole calculus. 3. No bowel obstruction. Normal appendix. 4.  Aortic Atherosclerosis (ICD10-I70.0). Electronically Signed   By: Elgie Collard M.D.   On: 05/02/2023 21:47     Assessment/Plan   1. AKI; left ureteral stone; intractable flank pain  - Continue IVF hydration, npo pending assessment by urology, pain-control, renally-dose medications, and repeat chem panel in am    2. Hypertension  - Continue Norvasc   3. Hypokalemia  - Replacing    DVT prophylaxis: SCDs  Code Status: Full   Level of Care: Level of care: Telemetry Family Communication: None present   Disposition Plan:  Patient is from: home  Anticipated d/c is to: Home  Anticipated d/c date is: 05/04/23  Patient currently: Pending pain-control, stable renal function, urology consultation  Consults called: Urology  Admission status: Observation     Briscoe Deutscher, MD [facility]  05/03/2023, 2:54 AM

## 2023-05-03 NOTE — Progress Notes (Signed)
Hospitalist Transfer Note:    Nursing staff, Please call [facility] Admits & Consults System-Wide number on Amion [phone]) as soon as patient's arrival, so appropriate admitting provider can evaluate the pt.  Transferring facility: [facility] Requesting provider: Arthor Captain, PA (EDP at [facility]) Reason for transfer: admission for further evaluation and management of AKI, left ureteral stone.     47 year old female with chronic left ureteral stone, who presented to [facility] ED complaining of 1 day of progressive left flank discomfort.   She has a reported history of chronic left ureteral stone, for which she typically follows with [facility] urology.  Of note, she also underwent microdiscectomy a few days ago.  Vital signs in the ED were notable for the following: Afebrile; heart rates in the 70s to 80s; systolic blood pressures in the 140s to 160s mmHg.  Labs were notable for BMP, which showed creatinine of 1.43 compared to most recent prior value of 0.94 on 04/21/2023; BUN 18 today compared to 9 on 04/21/2023.  Imaging notable for CT renal stone study, which was reported to show the patient's chronic left ureteral stone, measuring 14 mm, without overt evidence of associated obstruction, and was reported to be unchanged from most recent prior imaging in our system, which occurred approximately 1 year ago, on 03/30/2022.   However, given interval acute kidney injury, EDP discussed with on-call urology, Dr. Mena Goes, who recommended [facility] admission to [facility]. He conveyed that urology will formally consult and see the patient in the morning, requesting that the patient be made n.p.o. after midnight in case urology ultimately pursues cystoscopy.    Subsequently, I accepted this patient for transfer for observation to a med-tele bed at [facility] for further work-up and management of the above.       Newton Pigg, DO Hospitalist

## 2023-05-03 NOTE — Op Note (Signed)
Preoperative diagnosis:  Left UPJ calculus   Postoperative diagnosis:  Left UPJ calculus   Procedure:  Cystoscopy Left ureteral stent placement (6 x 26 - no string)  Left retrograde pyelography with interpretation   Surgeon: Moody Bruins. M.D.  Anesthesia: General  Complications: None  Intraoperative findings: Left retrograde pyelography was performed with Omnipaque contrast and a 6 French ureteral catheter.  This revealed a large filling defect at the level of the UPJ consistent with her known stone.  There was hydronephrosis.  EBL: Minimal  Specimens: None  Indication: Suzanne [name] is a 47 y.o. patient with a 14 mm left UPJ stone and intractable pain. After reviewing the management options for treatment, he elected to proceed with the above surgical procedure(s). We have discussed the potential benefits and risks of the procedure, side effects of the proposed treatment, the likelihood of the patient achieving the goals of the procedure, and any potential problems that might occur during the procedure or recuperation. Informed consent has been obtained.  Description of procedure:  The patient was taken to the operating room and general anesthesia was induced.  The patient was placed in the dorsal lithotomy position, prepped and draped in the usual sterile fashion, and preoperative antibiotics were administered. A preoperative time-out was performed.   Cystourethroscopy was performed.  The patient's urethra was examined and was normal. The bladder was then systematically examined in its entirety. There was no evidence for any bladder tumors, stones, or other mucosal pathology.    Attention then turned to the left ureteral orifice and a ureteral catheter was used to intubate the ureteral orifice.  Omnipaque contrast was injected through the ureteral catheter and a retrograde pyelogram was performed with findings as dictated above.  A 0.38 sensor guidewire was then advanced  up the left ureter into the renal pelvis under fluoroscopic guidance.  The wire was then backloaded through the cystoscope and a ureteral stent was advance over the wire using Seldinger technique.  The stent was positioned appropriately under fluoroscopic and cystoscopic guidance.  The wire was then removed with an adequate stent curl noted in the renal pelvis as well as in the bladder.  The bladder was then emptied and the procedure ended.  The patient appeared to tolerate the procedure well and without complications.  The patient was able to be awakened and transferred to the recovery unit in satisfactory condition.    Moody Bruins MD

## 2023-05-04 ENCOUNTER — Encounter (HOSPITAL_COMMUNITY): Payer: Self-pay | Admitting: Urology

## 2023-05-04 DIAGNOSIS — N179 Acute kidney failure, unspecified: Secondary | ICD-10-CM | POA: Diagnosis not present

## 2023-05-04 LAB — BASIC METABOLIC PANEL
Anion gap: 11 (ref 5–15)
BUN: 20 mg/dL (ref 6–20)
CO2: 22 mmol/L (ref 22–32)
Calcium: 9.7 mg/dL (ref 8.9–10.3)
Chloride: 103 mmol/L (ref 98–111)
Creatinine, Ser: 1.1 mg/dL — ABNORMAL HIGH (ref 0.44–1.00)
GFR, Estimated: >60 mL/min (ref >60)
Glucose, Bld: 184 mg/dL — ABNORMAL HIGH (ref 70–99)
Potassium: 4 mmol/L (ref 3.5–5.1)
Sodium: 136 mmol/L (ref 135–145)

## 2023-05-04 LAB — CBC
HCT: 39.9 % (ref 36.0–46.0)
Hemoglobin: 13.1 g/dL (ref 12.0–15.0)
MCH: 32.3 pg (ref 26.0–34.0)
MCHC: 32.8 g/dL (ref 30.0–36.0)
MCV: 98.5 fL (ref 80.0–100.0)
Platelets: 320 10*3/uL (ref 150–400)
RBC: 4.05 MIL/uL (ref 3.87–5.11)
RDW: 11.8 % (ref 11.5–15.5)
WBC: 7.1 10*3/uL (ref 4.0–10.5)
nRBC: 0 % (ref 0.0–0.2)

## 2023-05-04 LAB — GLUCOSE, CAPILLARY
Glucose-Capillary: 191 mg/dL — ABNORMAL HIGH (ref 70–99)
Glucose-Capillary: 191 mg/dL — ABNORMAL HIGH (ref 70–99)
Glucose-Capillary: 214 mg/dL — ABNORMAL HIGH (ref 70–99)

## 2023-05-04 MED ORDER — OMEPRAZOLE 20 MG PO CPDR: 20.0000 mg | DELAYED_RELEASE_CAPSULE | Freq: Every morning | ORAL | 1 refills | Status: AC

## 2023-05-04 MED ORDER — POLYETHYLENE GLYCOL 3350 17 G PO PACK
17.0000 g | PACK | Freq: Every day | ORAL | Status: DC
  Administered 2023-05-04: 17 g via ORAL
  Filled 2023-05-04: qty 1

## 2023-05-04 MED ORDER — METFORMIN HCL 500 MG PO TABS
500.0000 mg | ORAL_TABLET | Freq: Two times a day (BID) | ORAL | 1 refills | Status: AC
Start: 2023-05-04 — End: 2023-07-03

## 2023-05-04 NOTE — Progress Notes (Signed)
1 Day Post-Op Subjective: Suzanne [name]. Pt feels well this am. Reviewed definitive stone mgmt plan for outpt.   Objective: Vital signs in last 24 hours: Temp:  [97.8 F (36.6 C)-99.4 F (37.4 C)] 99.4 F (37.4 C) (10/08 0414) Pulse Rate:  [77-108] 108 (10/08 0414) Resp:  [12-20] 18 (10/08 0414) BP: (140-168)/(86-105) 147/95 (10/08 0414) SpO2:  [95 %-100 %] 98 % (10/08 0414) Weight:  [92.1 kg] 92.1 kg (10/07 1525)  Assessment/Plan: #UPJ stone S/p left ureteral stent placement with Dr. Laverle Patter on 05/03/2023. WBC and serum creatinine within normal range Patient will need laser lithotripsy on an outpatient basis.  She expressed understanding that she is to call [facility] to schedule within the next few days if she does not hear from the clinic. Okay to discharge from urologic perspective  Intake/Output from previous day: 10/07 0701 - 10/08 0700 In: 2126.2 [P.O.:240; I.V.:1886.2] Out: 4610 [Urine:4600; Blood:10]  Intake/Output this shift: No intake/output data recorded.  Physical Exam:  General: Alert and oriented CV: No cyanosis Lungs: equal chest rise   Lab Results: Recent Labs    05/02/23 2126 05/03/23 0402 05/04/23 0359  HGB 12.2 12.3 13.1  HCT 36.2 38.4 39.9   BMET Recent Labs    05/03/23 0402 05/04/23 0359  NA 137 136  K 3.2* 4.0  CL 104 103  CO2 23 22  GLUCOSE 139* 184*  BUN 17 20  CREATININE 1.28* 1.10*  CALCIUM 9.1 9.7     Studies/Results: DG C-Arm 1-60 Min-No Report  Result Date: 05/03/2023 Fluoroscopy was utilized by the requesting physician.  No radiographic interpretation.   CT Renal Stone Study  Result Date: 05/02/2023 CLINICAL DATA:  Abdominal pain. EXAM: CT ABDOMEN AND PELVIS WITHOUT CONTRAST TECHNIQUE: Multidetector CT imaging of the abdomen and pelvis was performed following the standard protocol without IV contrast. RADIATION DOSE REDUCTION: This exam was performed according to the departmental dose-optimization program which  includes automated exposure control, adjustment of the mA and/or kV according to patient size and/or use of iterative reconstruction technique. COMPARISON:  CT abdomen pelvis dated 05/19/2022. FINDINGS: Evaluation of this exam is limited in the absence of intravenous contrast. Lower chest: The visualized lung bases are clear. No intra-abdominal free air or free fluid. Hepatobiliary: The liver is unremarkable. No biliary dilatation. The gallbladder is unremarkable. Pancreas: Unremarkable. No pancreatic ductal dilatation or surrounding inflammatory changes. Spleen: Normal in size without focal abnormality. Adrenals/Urinary Tract: The adrenal glands are unremarkable. Similar location of a 14 mm stone at the left ureteropelvic junction with mild to moderate left hydronephrosis. The degree of hydronephrosis is similar or slightly increased since the prior CT. There is a 5 mm nonobstructing left renal inferior pole calculus. There is left perinephric stranding, nonspecific. Correlation with urinalysis recommended to exclude superimposed UTI. The right kidney is unremarkable. The visualized right ureter and urinary bladder appear unremarkable. Stomach/Bowel: There is no bowel obstruction or active inflammation. The appendix is normal. Vascular/Lymphatic: Mild aortoiliac atherosclerotic disease. The IVC is unremarkable. No portal venous gas. There is no adenopathy. Reproductive: The uterus is anteverted and grossly unremarkable. No adnexal masses. Other: None Musculoskeletal: No acute or significant osseous findings. IMPRESSION: 1. A 14 mm left UVJ calculus with mild to moderate hydronephrosis, minimally increased since the prior CT. 2. A 5 mm nonobstructing left renal inferior pole calculus. 3. No bowel obstruction. Normal appendix. 4.  Aortic Atherosclerosis (ICD10-I70.0). Electronically Signed   By: Elgie Collard M.D.   On: 05/02/2023 21:47      LOS:  1 day   Elmon Kirschner, NP [facility] Pager: [phone]  05/04/2023, 8:03 AM

## 2023-05-04 NOTE — Discharge Summary (Signed)
Physician Discharge Summary  Suzanne [name] FIE:[id number] DOB: [birth year] DOA: 05/02/2023  PCP: Rometta Emery, MD  Admit date: 05/02/2023 Discharge date: 05/04/2023  Admitted From: Home Disposition:  Home   Recommendations for Outpatient Follow-up:  Follow up with PCP in 1 week Follow up with Urology for lithotripsy   Discharge Condition: Stable CODE STATUS: Full  Diet recommendation:  Diet Orders (From admission, onward)     Start     Ordered   05/04/23 0000  Diet general        05/04/23 1025   05/03/23 1806  Diet regular Fluid consistency: Thin  Diet effective now       Question:  Fluid consistency:  Answer:  Thin   05/03/23 1805           Brief/Interim Summary: Suzanne [name] is a 47 y.o. 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   Back pain with radiculopathy has improved since her surgery but she woke early this morning with a different type of pain in the left flank and left groin that has become severe at times.  She has had nausea and loss of appetite associated with this but no vomiting or fever. CT demonstrates 14 mm left UVJ calculus with mild to moderate hydronephrosis.  Urology consulted.  She underwent left ureteral stent placement with Dr. Laverle Patter on 05/03/2023. AKI resolved.   Discharge Diagnoses:   Principal Problem:   AKI (acute kidney injury) (HCC) Active Problems:   Hypertension, benign   Left ureteral stone   Intractable pain   Hypokalemia   Discharge Instructions  Discharge Instructions     Call MD for:  difficulty breathing, headache or visual disturbances   Complete by: As directed    Call MD for:  extreme fatigue   Complete by: As directed    Call MD for:  persistant dizziness or light-headedness   Complete by: As directed    Call MD for:  persistant nausea and vomiting    Complete by: As directed    Call MD for:  severe uncontrolled pain   Complete by: As directed    Call MD for:  temperature >100.4   Complete by: As directed    Diet general   Complete by: As directed    Discharge instructions   Complete by: As directed    You were cared for by a hospitalist during your hospital stay. If you have any questions about your discharge medications or the care you received while you were in the hospital after you are discharged, you can call the unit and ask to speak with the hospitalist on call if the hospitalist that took care of you is not available. Once you are discharged, your primary care physician will handle any further medical issues. Please note that NO REFILLS for any discharge medications will be authorized once you are discharged, as it is imperative that you return to your primary care physician (or establish a relationship with a primary care physician if you do not have one) for your aftercare needs so that they can reassess your need for medications and monitor your lab values.   Increase activity slowly   Complete by: As directed       Allergies as of 05/04/2023       Reactions   Coconut Flavor Nausea Only, Other (See Comments)    GI Intolerance   Latex Hives,  Itching   Pineapple Itching, Swelling   Tongue swelling   Tomato Itching, Swelling   Tongue swelling.         Medication List     STOP taking these medications    aspirin 81 MG chewable tablet   gabapentin 300 MG capsule Commonly known as: NEURONTIN       TAKE these medications    acetaminophen 500 MG tablet Commonly known as: TYLENOL Take 500 mg by mouth as needed for moderate pain.   amLODipine 10 MG tablet Commonly known as: Norvasc Take 1 tablet (10 mg total) by mouth daily. What changed: when to take this   hydrALAZINE 10 MG tablet Commonly known as: APRESOLINE Take 1 tablet (10 mg total) by mouth in the morning and at bedtime.   metFORMIN 500 MG  tablet Commonly known as: GLUCOPHAGE Take 1 tablet (500 mg total) by mouth 2 (two) times daily with a meal.   omeprazole 20 MG capsule Commonly known as: PRILOSEC Take 1 capsule (20 mg total) by mouth in the morning.   oxyCODONE 5 MG immediate release tablet Commonly known as: Oxy IR/ROXICODONE Take 1 tablet (5 mg total) by mouth every 4 (four) hours as needed (pain).        Follow-up Information     Rometta Emery, MD Follow up.   Specialty: Internal Medicine Contact information: [address] [place] [address] [phone]         [facility] Follow up.   Contact information: [address] [place] [address] [place] [address] [phone]               Allergies  Allergen Reactions   Coconut Flavor Nausea Only and Other (See Comments)     GI Intolerance   Latex Hives and Itching   Pineapple Itching and Swelling    Tongue swelling   Tomato Itching and Swelling    Tongue swelling.     Consultations: Urology    Procedures/Studies: DG C-Arm 1-60 Min-No Report  Result Date: 05/03/2023 Fluoroscopy was utilized by the requesting physician.  No radiographic interpretation.   CT Renal Stone Study  Result Date: 05/02/2023 CLINICAL DATA:  Abdominal pain. EXAM: CT ABDOMEN AND PELVIS WITHOUT CONTRAST TECHNIQUE: Multidetector CT imaging of the abdomen and pelvis was performed following the standard protocol without IV contrast. RADIATION DOSE REDUCTION: This exam was performed according to the departmental dose-optimization program which includes automated exposure control, adjustment of the mA and/or kV according to patient size and/or use of iterative reconstruction technique. COMPARISON:  CT abdomen pelvis dated 05/19/2022. FINDINGS: Evaluation of this exam is limited in the absence of intravenous contrast. Lower chest: The visualized lung bases are clear. No intra-abdominal free air or free fluid. Hepatobiliary: The liver is  unremarkable. No biliary dilatation. The gallbladder is unremarkable. Pancreas: Unremarkable. No pancreatic ductal dilatation or surrounding inflammatory changes. Spleen: Normal in size without focal abnormality. Adrenals/Urinary Tract: The adrenal glands are unremarkable. Similar location of a 14 mm stone at the left ureteropelvic junction with mild to moderate left hydronephrosis. The degree of hydronephrosis is similar or slightly increased since the prior CT. There is a 5 mm nonobstructing left renal inferior pole calculus. There is left perinephric stranding, nonspecific. Correlation with urinalysis recommended to exclude superimposed UTI. The right kidney is unremarkable. The visualized right ureter and urinary bladder appear unremarkable. Stomach/Bowel: There is no bowel obstruction or active inflammation. The appendix is normal. Vascular/Lymphatic: Mild aortoiliac atherosclerotic disease. The  IVC is unremarkable. No portal venous gas. There is no adenopathy. Reproductive: The uterus is anteverted and grossly unremarkable. No adnexal masses. Other: None Musculoskeletal: No acute or significant osseous findings. IMPRESSION: 1. A 14 mm left UVJ calculus with mild to moderate hydronephrosis, minimally increased since the prior CT. 2. A 5 mm nonobstructing left renal inferior pole calculus. 3. No bowel obstruction. Normal appendix. 4.  Aortic Atherosclerosis (ICD10-I70.0). Electronically Signed   By: Elgie Collard M.D.   On: 05/02/2023 21:47   DG Lumbar Spine 1 View  Result Date: 04/27/2023 CLINICAL DATA:  Elective surgery. EXAM: LUMBAR SPINE - 1 VIEW COMPARISON:  Preoperative imaging. FINDINGS: Single lateral fluoroscopic spot view of the lumbar spine obtained in the operating room. Surgical instrument localizes posteriorly at L5-S1. Fluoroscopy time 5 seconds. Dose 7.17 mGy IMPRESSION: Intraoperative fluoroscopy during lumbar spine surgery. Electronically Signed   By: Narda Rutherford M.D.   On:  04/27/2023 10:46   DG C-Arm 1-60 Min-No Report  Result Date: 04/27/2023 Fluoroscopy was utilized by the requesting physician.  No radiographic interpretation.   DG C-Arm 1-60 Min-No Report  Result Date: 04/27/2023 Fluoroscopy was utilized by the requesting physician.  No radiographic interpretation.       Discharge Exam: Vitals:   05/03/23 2213 05/04/23 0414  BP: (!) 152/86 (!) 147/95  Pulse: 93 (!) 108  Resp: 18 18  Temp: 98.8 F (37.1 C) 99.4 F (37.4 C)  SpO2: 98% 98%    General: Pt is alert, awake, not in acute distress Cardiovascular: RRR, S1/S2 +, no edema Respiratory: CTA bilaterally, no wheezing, no rhonchi, no respiratory distress, no conversational dyspnea  Abdominal: Soft, NT, ND, bowel sounds + Extremities: no edema, no cyanosis Psych: Normal mood and affect, stable judgement and insight     The results of significant diagnostics from this hospitalization (including imaging, microbiology, ancillary and laboratory) are listed below for reference.     Microbiology: No results found for this or any previous visit (from the past 240 hour(s)).   Labs: BNP (last 3 results) No results for input(s): "BNP" in the last 8760 hours. Basic Metabolic Panel: Recent Labs  Lab 05/02/23 2126 05/03/23 0402 05/04/23 0359  NA 140 137 136  K 3.1* 3.2* 4.0  CL 105 104 103  CO2 27 23 22   GLUCOSE 118* 139* 184*  BUN 18 17 20   CREATININE 1.43* 1.28* 1.10*  CALCIUM 9.3 9.1 9.7  MG  --  2.4  --    Liver Function Tests: No results for input(s): "AST", "ALT", "ALKPHOS", "BILITOT", "PROT", "ALBUMIN" in the last 168 hours. No results for input(s): "LIPASE", "AMYLASE" in the last 168 hours. No results for input(s): "AMMONIA" in the last 168 hours. CBC: Recent Labs  Lab 05/02/23 2126 05/03/23 0402 05/04/23 0359  WBC 12.0* 10.2 7.1  HGB 12.2 12.3 13.1  HCT 36.2 38.4 39.9  MCV 97.1 101.1* 98.5  PLT 301 305 320   Cardiac Enzymes: No results for input(s): "CKTOTAL",  "CKMB", "CKMBINDEX", "TROPONINI" in the last 168 hours. BNP: Invalid input(s): "POCBNP" CBG: Recent Labs  Lab 05/03/23 1648 05/03/23 2021 05/03/23 2347 05/04/23 0418 05/04/23 0736  GLUCAP 113* 268* 207* 191* 191*   D-Dimer No results for input(s): "DDIMER" in the last 72 hours. Hgb A1c No results for input(s): "HGBA1C" in the last 72 hours. Lipid Profile No results for input(s): "CHOL", "HDL", "LDLCALC", "TRIG", "CHOLHDL", "LDLDIRECT" in the last 72 hours. Thyroid function studies No results for input(s): "TSH", "T4TOTAL", "T3FREE", "THYROIDAB" in the last  72 hours.  Invalid input(s): "FREET3" Anemia work up No results for input(s): "VITAMINB12", "FOLATE", "FERRITIN", "TIBC", "IRON", "RETICCTPCT" in the last 72 hours. Urinalysis    Component Value Date/Time   COLORURINE YELLOW 05/02/2023 2135   APPEARANCEUR HAZY (A) 05/02/2023 2135   LABSPEC 1.021 05/02/2023 2135   PHURINE 7.0 05/02/2023 2135   GLUCOSEU NEGATIVE 05/02/2023 2135   HGBUR NEGATIVE 05/02/2023 2135   BILIRUBINUR NEGATIVE 05/02/2023 2135   KETONESUR NEGATIVE 05/02/2023 2135   PROTEINUR NEGATIVE 05/02/2023 2135   UROBILINOGEN 0.2 04/17/2016 0912   NITRITE NEGATIVE 05/02/2023 2135   LEUKOCYTESUR NEGATIVE 05/02/2023 2135   Sepsis Labs Recent Labs  Lab 05/02/23 2126 05/03/23 0402 05/04/23 0359  WBC 12.0* 10.2 7.1   Microbiology No results found for this or any previous visit (from the past 240 hour(s)).   Patient was seen and examined on the day of discharge and was found to be in stable condition. Time coordinating discharge: 25 minutes including assessment and coordination of care, as well as examination of the patient.   SIGNED:  Noralee Stain, DO [facility] 05/04/2023, 10:26 AM

## 2023-05-05 ENCOUNTER — Ambulatory Visit (HOSPITAL_BASED_OUTPATIENT_CLINIC_OR_DEPARTMENT_OTHER)
Admission: RE | Admit: 2023-05-05 | Discharge: 2023-05-05 | Disposition: A | Discharge status: Home / Self Care | Payer: Medicaid Other | Source: Ambulatory Visit | Attending: Urology | Admitting: Urology

## 2023-05-05 ENCOUNTER — Ambulatory Visit: Payer: Self-pay | Admitting: Urology

## 2023-05-05 ENCOUNTER — Ambulatory Visit (INDEPENDENT_AMBULATORY_CARE_PROVIDER_SITE_OTHER): Payer: Medicaid Other | Admitting: Urology

## 2023-05-05 ENCOUNTER — Encounter: Payer: Self-pay | Admitting: Urology

## 2023-05-05 VITALS — BP 153/93 | HR 116 | Ht 66.0 in | Wt 203.0 lb

## 2023-05-05 DIAGNOSIS — N201 Calculus of ureter: Secondary | ICD-10-CM | POA: Diagnosis present

## 2023-05-05 NOTE — Progress Notes (Signed)
Assessment: 1. Left ureteral stone     Plan: Management options for her left renal pelvic stone were discussed in detail today including ESWL as well as endoscopic management.  I discussed these approaches not only in terms of expected stone free rates but also in terms of the need for additional procedures as well as adverse events or complications.  Following our discussion she elects to proceed with left ESWL.  She wants to wait a few weeks to get over her back surgery. Will plan on scheduling 05/31/23 Will come in week prior for preop UA/culture if needed and to start antibiotics if necessary.  Chief Complaint: No chief complaint on file.   HPI: Suzanne [name] is a 47 y.o. female who presents for continued evaluation of a 14 mm left renal pelvic stone.  Patient presented to [facility] long ED with acute onset of left flank pain on 05/02/2023 which was 5 days following lumbar spine surgery.  CT stone study demonstrated a 14 mm left UPJ stone.  She also had AKI.  It appears that her right kidney is relatively small compared to her left.  She was taken to the operating room on 05/03/2023 and underwent placement of a left double-J stent by Dr. Laverle Patter. She has done well since stent placement and her AKI resolved. KUB today shows the stent in good position with the stone in the center of the stent coil.  Patient presents here today to discuss definitive stone management.    Portions of the above documentation were copied from a prior visit for review purposes only.  Allergies: Allergies  Allergen Reactions   Coconut Flavor Nausea Only and Other (See Comments)     GI Intolerance   Latex Hives and Itching   Pineapple Itching and Swelling    Tongue swelling   Tomato Itching and Swelling    Tongue swelling.     PMH: Past Medical History:  Diagnosis Date   Anemia    Diabetes mellitus without complication (HCC)    GERD (gastroesophageal reflux disease)    Headache    History of kidney  stones    Hypertension    Sciatica     PSH: Past Surgical History:  Procedure Laterality Date   CERVICAL BIOPSY     CESAREAN SECTION N/A 05/15/2016   Procedure: CESAREAN SECTION;  Surgeon: Tilda Burrow, MD;  Location: [facility] BIRTHING SUIT[facility];  Service: Obstetrics;  Laterality: N/A;   COLPOSCOPY     CYSTOSCOPY WITH RETROGRADE PYELOGRAM, URETEROSCOPY AND STENT PLACEMENT Left 05/03/2023   Procedure: CYSTOSCOPY WITH RETROGRADE PYELOGRAM, URETEROSCOPY AND STENT PLACEMENT;  Surgeon: Heloise Purpura, MD;  Location: [facility] ORS;  Service: Urology;  Laterality: Left;   LUMBAR LAMINECTOMY/ DECOMPRESSION WITH MET-RX Left 10/19/2022   Procedure: Left Lumbar five-Sacral one Microlaminectomy and discectomy;  Surgeon: Jadene Pierini, MD;  Location: [facility] OR;  Service: Neurosurgery;  Laterality: Left;   LUMBAR LAMINECTOMY/ DECOMPRESSION WITH MET-RX Left 04/27/2023   Procedure: Revision Left Minimally Invasive Lumbar Five-Sacral One Discectomy;  Surgeon: Jadene Pierini, MD;  Location: [facility] OR;  Service: Neurosurgery;  Laterality: Left;  3C    SH: Social History   Tobacco Use   Smoking status: Every Day    Current packs/day: 0.25    Average packs/day: 0.3 packs/day for 15.0 years (3.8 ttl pk-yrs)    Types: Cigarettes   Smokeless tobacco: Never  Vaping Use   Vaping status: Never Used  Substance Use Topics   Alcohol use: No    Comment: rare  Drug use: Yes    Frequency: 1.0 times per week    Types: Marijuana    Comment: smoke and gummies    ROS: Constitutional:  Negative for fever, chills, weight loss CV: Negative for chest pain, previous MI, hypertension Respiratory:  Negative for shortness of breath, wheezing, sleep apnea, frequent cough GI:  Negative for nausea, vomiting, bloody stool, GERD  PE: BP (!) 153/93   Pulse (!) 116   Ht 5\' 6"  (1.676 m)   Wt 203 lb (92.1 kg)   BMI 32.77 kg/m  GENERAL APPEARANCE:  Well appearing, well developed, well nourished, NAD HEENT:  Atraumatic,  normocephalic COR:  RR LUNGS:  CTA ABDOMEN:  Soft, non-tender, no masses EXTREMITIES:  Moves all extremities well, without clubbing, cyanosis, or edema NEUROLOGIC:  Alert and oriented x 3, normal gait, CN II-XII grossly intact MENTAL STATUS:  appropriate BACK:  Non-tender to palpation, No CVAT SKIN:  Warm, dry, and intact   Results: Results for orders placed or performed during the hospital encounter of 05/02/23 (from the past 24 hour(s))  Glucose, capillary   Collection Time: 05/04/23 12:10 PM  Result Value Ref Range   Glucose-Capillary 214 (H) 70 - 99 mg/dL

## 2023-05-05 NOTE — H&P (View-Only) (Signed)
Assessment: 1. Left ureteral stone     Plan: Management options for her left renal pelvic stone were discussed in detail today including ESWL as well as endoscopic management.  I discussed these approaches not only in terms of expected stone free rates but also in terms of the need for additional procedures as well as adverse events or complications.  Following our discussion she elects to proceed with left ESWL.  She wants to wait a few weeks to get over her back surgery. Will plan on scheduling 05/31/23 Will come in week prior for preop UA/culture if needed and to start antibiotics if necessary.  Chief Complaint: No chief complaint on file.   HPI: Suzanne [name] is a 47 y.o. female who presents for continued evaluation of a 14 mm left renal pelvic stone.  Patient presented to [facility] long ED with acute onset of left flank pain on 05/02/2023 which was 5 days following lumbar spine surgery.  CT stone study demonstrated a 14 mm left UPJ stone.  She also had AKI.  It appears that her right kidney is relatively small compared to her left.  She was taken to the operating room on 05/03/2023 and underwent placement of a left double-J stent by Dr. Laverle Patter. She has done well since stent placement and her AKI resolved. KUB today shows the stent in good position with the stone in the center of the stent coil.  Patient presents here today to discuss definitive stone management.    Portions of the above documentation were copied from a prior visit for review purposes only.  Allergies: Allergies  Allergen Reactions   Coconut Flavor Nausea Only and Other (See Comments)     GI Intolerance   Latex Hives and Itching   Pineapple Itching and Swelling    Tongue swelling   Tomato Itching and Swelling    Tongue swelling.     PMH: Past Medical History:  Diagnosis Date   Anemia    Diabetes mellitus without complication (HCC)    GERD (gastroesophageal reflux disease)    Headache    History of kidney  stones    Hypertension    Sciatica     PSH: Past Surgical History:  Procedure Laterality Date   CERVICAL BIOPSY     CESAREAN SECTION N/A 05/15/2016   Procedure: CESAREAN SECTION;  Surgeon: Tilda Burrow, MD;  Location: [facility] BIRTHING SUITES;  Service: Obstetrics;  Laterality: N/A;   COLPOSCOPY     CYSTOSCOPY WITH RETROGRADE PYELOGRAM, URETEROSCOPY AND STENT PLACEMENT Left 05/03/2023   Procedure: CYSTOSCOPY WITH RETROGRADE PYELOGRAM, URETEROSCOPY AND STENT PLACEMENT;  Surgeon: Heloise Purpura, MD;  Location: [facility];  Service: Urology;  Laterality: Left;   LUMBAR LAMINECTOMY/ DECOMPRESSION WITH MET-RX Left 10/19/2022   Procedure: Left Lumbar five-Sacral one Microlaminectomy and discectomy;  Surgeon: Jadene Pierini, MD;  Location: [facility] OR;  Service: Neurosurgery;  Laterality: Left;   LUMBAR LAMINECTOMY/ DECOMPRESSION WITH MET-RX Left 04/27/2023   Procedure: Revision Left Minimally Invasive Lumbar Five-Sacral One Discectomy;  Surgeon: Jadene Pierini, MD;  Location: [facility] OR;  Service: Neurosurgery;  Laterality: Left;  3C    SH: Social History   Tobacco Use   Smoking status: Every Day    Current packs/day: 0.25    Average packs/day: 0.3 packs/day for 15.0 years (3.8 ttl pk-yrs)    Types: Cigarettes   Smokeless tobacco: Never  Vaping Use   Vaping status: Never Used  Substance Use Topics   Alcohol use: No    Comment: rare  Drug use: Yes    Frequency: 1.0 times per week    Types: Marijuana    Comment: smoke and gummies    ROS: Constitutional:  Negative for fever, chills, weight loss CV: Negative for chest pain, previous MI, hypertension Respiratory:  Negative for shortness of breath, wheezing, sleep apnea, frequent cough GI:  Negative for nausea, vomiting, bloody stool, GERD  PE: BP (!) 153/93   Pulse (!) 116   Ht 5\' 6"  (1.676 m)   Wt 203 lb (92.1 kg)   BMI 32.77 kg/m  GENERAL APPEARANCE:  Well appearing, well developed, well nourished, NAD HEENT:  Atraumatic,  normocephalic COR:  RR LUNGS:  CTA ABDOMEN:  Soft, non-tender, no masses EXTREMITIES:  Moves all extremities well, without clubbing, cyanosis, or edema NEUROLOGIC:  Alert and oriented x 3, normal gait, CN II-XII grossly intact MENTAL STATUS:  appropriate BACK:  Non-tender to palpation, No CVAT SKIN:  Warm, dry, and intact   Results: Results for orders placed or performed during the hospital encounter of 05/02/23 (from the past 24 hour(s))  Glucose, capillary   Collection Time: 05/04/23 12:10 PM  Result Value Ref Range   Glucose-Capillary 214 (H) 70 - 99 mg/dL

## 2023-05-05 NOTE — Anesthesia Postprocedure Evaluation (Signed)
Anesthesia Post Note  Patient: Suzanne [name]  Procedure(s) Performed: CYSTOSCOPY WITH RETROGRADE PYELOGRAM, URETEROSCOPY AND STENT PLACEMENT (Left: Ureter)     Patient location during evaluation: PACU Anesthesia Type: General Level of consciousness: awake and alert Pain management: pain level controlled Vital Signs Assessment: post-procedure vital signs reviewed and stable Respiratory status: spontaneous breathing, nonlabored ventilation, respiratory function stable and patient connected to nasal cannula oxygen Cardiovascular status: blood pressure returned to baseline and stable Postop Assessment: no apparent nausea or vomiting Anesthetic complications: no   No notable events documented.  Last Vitals:  Vitals:   05/03/23 2213 05/04/23 0414  BP: (!) 152/86 (!) 147/95  Pulse: 93 (!) 108  Resp: 18 18  Temp: 37.1 C 37.4 C  SpO2: 98% 98%    Last Pain:  Vitals:   05/04/23 1344  TempSrc:   PainSc: 5                  Suzanne [name]

## 2023-05-06 LAB — MICROSCOPIC EXAMINATION: RBC, Urine: >30 /[HPF] — ABNORMAL HIGH (ref 0–2)

## 2023-05-06 LAB — URINALYSIS, ROUTINE W REFLEX MICROSCOPIC
Bilirubin, UA: NEGATIVE
Glucose, UA: NEGATIVE
Nitrite, UA: NEGATIVE
Specific Gravity, UA: >1.03 — ABNORMAL HIGH (ref 1.005–1.030)
Urobilinogen, Ur: 0.2 mg/dL (ref 0.2–1.0)
pH, UA: 5.5 (ref 5.0–7.5)

## 2023-05-13 ENCOUNTER — Encounter: Payer: Medicaid Other | Admitting: Urology

## 2023-05-20 ENCOUNTER — Telehealth: Payer: Self-pay

## 2023-05-20 NOTE — Telephone Encounter (Signed)
Spoke with BJ at [employer], she stated that it does require a PA due to the location. She stated that she will call to see if they tell her no PA required or if she is successful at getting a PA for pt.

## 2023-05-20 NOTE — Plan of Care (Signed)
CHL Tonsillectomy/Adenoidectomy, Postoperative PEDS care plan entered in error.

## 2023-05-20 NOTE — Telephone Encounter (Signed)
-----   Message from Rodeo J sent at 05/10/2023  9:34 AM EDT ----- Regarding: RE: insurnace card Called pt and lvm for her to upload the insurance card through [employer].   This is the phone number from the chart for insurance  Ph: [phone] ----- Message ----- From: Carolin Coy, CMA Sent: 05/10/2023   8:53 AM EDT To: Claudie Leach Subject: insurnace card                                 Please contact pt and get a copy of her insurance card. We do not have a copy of the card for what is listed as her insurance. She is scheduled for surgery and I need to do a prior auth.   Thanks so much for your help with this.   Tiffanye Hartmann

## 2023-05-24 NOTE — Progress Notes (Signed)
Patient called. Instructions given. Arrival time on Monday is 1100. Nothing to eat or drink after MN except a sip of water with AM meds. Meds and Hx reviewed. Mom is the driver. Bring blue folder in. Bowel prep on Sunday and eat a light supper that night. Wear comfortable clothing and tennis shoes

## 2023-05-31 ENCOUNTER — Other Ambulatory Visit: Payer: Self-pay

## 2023-05-31 ENCOUNTER — Ambulatory Visit (HOSPITAL_BASED_OUTPATIENT_CLINIC_OR_DEPARTMENT_OTHER)
Admission: RE | Admit: 2023-05-31 | Discharge: 2023-05-31 | Disposition: A | Discharge status: Home / Self Care | Payer: Medicaid Other | Attending: Urology | Admitting: Urology

## 2023-05-31 ENCOUNTER — Encounter (HOSPITAL_BASED_OUTPATIENT_CLINIC_OR_DEPARTMENT_OTHER): Payer: Self-pay | Admitting: Urology

## 2023-05-31 ENCOUNTER — Other Ambulatory Visit: Payer: Self-pay | Admitting: Urology

## 2023-05-31 ENCOUNTER — Ambulatory Visit (HOSPITAL_COMMUNITY): Payer: Medicaid Other

## 2023-05-31 ENCOUNTER — Encounter (HOSPITAL_BASED_OUTPATIENT_CLINIC_OR_DEPARTMENT_OTHER)
Admission: RE | Disposition: A | Discharge status: Home / Self Care | Payer: Self-pay | Source: Home / Self Care | Attending: Urology

## 2023-05-31 DIAGNOSIS — E119 Type 2 diabetes mellitus without complications: Secondary | ICD-10-CM | POA: Diagnosis not present

## 2023-05-31 DIAGNOSIS — N201 Calculus of ureter: Secondary | ICD-10-CM

## 2023-05-31 DIAGNOSIS — I10 Essential (primary) hypertension: Secondary | ICD-10-CM | POA: Diagnosis not present

## 2023-05-31 DIAGNOSIS — F17210 Nicotine dependence, cigarettes, uncomplicated: Secondary | ICD-10-CM | POA: Insufficient documentation

## 2023-05-31 DIAGNOSIS — N200 Calculus of kidney: Secondary | ICD-10-CM

## 2023-05-31 DIAGNOSIS — E669 Obesity, unspecified: Secondary | ICD-10-CM | POA: Insufficient documentation

## 2023-05-31 DIAGNOSIS — Z6832 Body mass index (BMI) 32.0-32.9, adult: Secondary | ICD-10-CM | POA: Diagnosis not present

## 2023-05-31 HISTORY — PX: EXTRACORPOREAL SHOCK WAVE LITHOTRIPSY: SHX1557

## 2023-05-31 LAB — GLUCOSE, CAPILLARY
Glucose-Capillary: 137 mg/dL — ABNORMAL HIGH (ref 70–99)
Glucose-Capillary: 116 mg/dL — ABNORMAL HIGH (ref 70–99)

## 2023-05-31 LAB — POCT PREGNANCY, URINE: Preg Test, Ur: NEGATIVE

## 2023-05-31 SURGERY — LITHOTRIPSY, ESWL
Anesthesia: LOCAL | Laterality: Left

## 2023-05-31 MED ORDER — DIPHENHYDRAMINE HCL 25 MG PO CAPS
25.0000 mg | ORAL_CAPSULE | Freq: Once | ORAL | Status: AC
  Administered 2023-05-31: 25 mg via ORAL

## 2023-05-31 MED ORDER — DIAZEPAM 5 MG PO TABS
ORAL_TABLET | ORAL | Status: AC
  Filled 2023-05-31: qty 1

## 2023-05-31 MED ORDER — SODIUM CHLORIDE 0.9 % IV SOLN: INTRAVENOUS | Status: DC

## 2023-05-31 MED ORDER — CIPROFLOXACIN HCL 500 MG PO TABS
ORAL_TABLET | ORAL | Status: AC
  Filled 2023-05-31: qty 1

## 2023-05-31 MED ORDER — DIAZEPAM 5 MG PO TABS
10.0000 mg | ORAL_TABLET | Freq: Once | ORAL | Status: AC
  Administered 2023-05-31: 10 mg via ORAL

## 2023-05-31 MED ORDER — OXYCODONE HCL 5 MG PO TABS: 5.0000 mg | ORAL_TABLET | ORAL | 0 refills | Status: DC | PRN

## 2023-05-31 MED ORDER — CIPROFLOXACIN HCL 500 MG PO TABS
500.0000 mg | ORAL_TABLET | Freq: Once | ORAL | Status: AC
  Administered 2023-05-31: 500 mg via ORAL

## 2023-05-31 MED ORDER — DIPHENHYDRAMINE HCL 25 MG PO CAPS
ORAL_CAPSULE | ORAL | Status: AC
  Filled 2023-05-31: qty 1

## 2023-05-31 NOTE — Interval H&P Note (Signed)
History and Physical Interval Note:  05/31/2023 11:42 AM  Suzanne [name]  has presented today for surgery, with the diagnosis of left ureteral stone.  The various methods of treatment have been discussed with the patient and family. After consideration of risks, benefits and other options for treatment, the patient has consented to  Procedure(s): EXTRACORPOREAL SHOCK WAVE LITHOTRIPSY (ESWL) (Left) as a surgical intervention.  The patient's history has been reviewed, patient examined, no change in status, [name] for surgery.  I have reviewed the patient's chart and labs.  Questions were answered to the patient's satisfaction.     Joline Maxcy

## 2023-05-31 NOTE — Discharge Instructions (Signed)

## 2023-06-01 ENCOUNTER — Encounter (HOSPITAL_BASED_OUTPATIENT_CLINIC_OR_DEPARTMENT_OTHER): Payer: Self-pay | Admitting: Urology

## 2023-06-10 ENCOUNTER — Encounter: Payer: Medicaid Other | Admitting: Urology

## 2023-06-15 ENCOUNTER — Encounter: Payer: Medicaid Other | Admitting: Urology

## 2023-06-17 ENCOUNTER — Ambulatory Visit (HOSPITAL_BASED_OUTPATIENT_CLINIC_OR_DEPARTMENT_OTHER)
Admission: RE | Admit: 2023-06-17 | Discharge: 2023-06-17 | Disposition: A | Discharge status: Home / Self Care | Payer: Medicaid Other | Source: Ambulatory Visit | Attending: Urology | Admitting: Urology

## 2023-06-17 ENCOUNTER — Ambulatory Visit: Payer: Medicaid Other | Admitting: Urology

## 2023-06-17 ENCOUNTER — Encounter: Payer: Self-pay | Admitting: Urology

## 2023-06-17 VITALS — BP 169/100 | HR 89

## 2023-06-17 DIAGNOSIS — N200 Calculus of kidney: Secondary | ICD-10-CM

## 2023-06-17 DIAGNOSIS — Z2989 Encounter for other specified prophylactic measures: Secondary | ICD-10-CM | POA: Diagnosis not present

## 2023-06-17 DIAGNOSIS — Z87442 Personal history of urinary calculi: Secondary | ICD-10-CM

## 2023-06-17 DIAGNOSIS — Z09 Encounter for follow-up examination after completed treatment for conditions other than malignant neoplasm: Secondary | ICD-10-CM

## 2023-06-17 DIAGNOSIS — Z466 Encounter for fitting and adjustment of urinary device: Secondary | ICD-10-CM

## 2023-06-17 LAB — URINALYSIS, ROUTINE W REFLEX MICROSCOPIC
Bilirubin, UA: NEGATIVE
Glucose, UA: NEGATIVE
Ketones, UA: NEGATIVE
Nitrite, UA: NEGATIVE
Specific Gravity, UA: 1.02 (ref 1.005–1.030)
Urobilinogen, Ur: 0.2 mg/dL (ref 0.2–1.0)
pH, UA: 6 (ref 5.0–7.5)

## 2023-06-17 LAB — MICROSCOPIC EXAMINATION: RBC, Urine: >30 /[HPF] — AB (ref 0–2)

## 2023-06-17 MED ORDER — CEFTRIAXONE SODIUM 1 G IJ SOLR
1.0000 g | Freq: Once | INTRAMUSCULAR | Status: AC
Start: 2023-06-17 — End: 2023-06-17
  Administered 2023-06-17: 1 g via INTRAMUSCULAR

## 2023-06-17 NOTE — Addendum Note (Signed)
Addended by: Carolin Coy on: 06/17/2023 01:21 PM   Modules accepted: Orders

## 2023-06-17 NOTE — Progress Notes (Signed)
Assessment: 1. Nephrolithiasis     Plan: DJ stent removed today Rocephin 1gm I'm Call with any problems Bring in stone for analysis FU 3 mo with KUB Stone prevention  Chief Complaint: Kidney stone  HPI: Suzanne [name] is a 47 y.o. female who presents for continued evaluation of nephrolithiasis. Patient presents here today for follow-up regarding her 14 mm left renal pelvic stone. Initial diagnosis with acute onset of left flank pain and AKI status post placement of left double-J stent on 05/03/2023. Patient tolerated the stent fairly well and her AKI resolved. Patient then underwent left ESWL 05/31/2023.  Since her ESWL she has done quite well-here today for follow-up and stent removal. She reports passing a lot of small sandy material. KUB today shows stent in good position.  There are some small fragments along proximal stent and in lower pole.  Portions of the above documentation were copied from a prior visit for review purposes only.  Allergies: Allergies  Allergen Reactions   Coconut Flavor Nausea Only and Other (See Comments)     GI Intolerance   Latex Hives and Itching   Pineapple Itching and Swelling    Tongue swelling   Tomato Itching and Swelling    Tongue swelling.     PMH: Past Medical History:  Diagnosis Date   Anemia    Diabetes mellitus without complication (HCC)    GERD (gastroesophageal reflux disease)    Headache    History of kidney stones    Hypertension    Sciatica     PSH: Past Surgical History:  Procedure Laterality Date   CERVICAL BIOPSY     CESAREAN SECTION N/A 05/15/2016   Procedure: CESAREAN SECTION;  Surgeon: Tilda Burrow, MD;  Location: [facility] BIRTHING SUITES;  Service: Obstetrics;  Laterality: N/A;   COLPOSCOPY     CYSTOSCOPY WITH RETROGRADE PYELOGRAM, URETEROSCOPY AND STENT PLACEMENT Left 05/03/2023   Procedure: CYSTOSCOPY WITH RETROGRADE PYELOGRAM, URETEROSCOPY AND STENT PLACEMENT;  Surgeon: Heloise Purpura, MD;  Location:  [facility];  Service: Urology;  Laterality: Left;   EXTRACORPOREAL SHOCK WAVE LITHOTRIPSY Left 05/31/2023   Procedure: EXTRACORPOREAL SHOCK WAVE LITHOTRIPSY (ESWL);  Surgeon: Joline Maxcy, MD;  Location: [facility];  Service: Urology;  Laterality: Left;   LUMBAR LAMINECTOMY/ DECOMPRESSION WITH MET-RX Left 10/19/2022   Procedure: Left Lumbar five-Sacral one Microlaminectomy and discectomy;  Surgeon: Jadene Pierini, MD;  Location: [facility] OR;  Service: Neurosurgery;  Laterality: Left;   LUMBAR LAMINECTOMY/ DECOMPRESSION WITH MET-RX Left 04/27/2023   Procedure: Revision Left Minimally Invasive Lumbar Five-Sacral One Discectomy;  Surgeon: Jadene Pierini, MD;  Location: [facility] OR;  Service: Neurosurgery;  Laterality: Left;  3C    SH: Social History   Tobacco Use   Smoking status: Every Day    Current packs/day: 0.25    Average packs/day: 0.3 packs/day for 15.0 years (3.8 ttl pk-yrs)    Types: Cigarettes   Smokeless tobacco: Never  Vaping Use   Vaping status: Never Used  Substance Use Topics   Alcohol use: No   Drug use: Yes    Frequency: 1.0 times per week    Types: Marijuana    Comment: smoke and gummies last dose 05/29/2023    ROS: Constitutional:  Negative for fever, chills, weight loss CV: Negative for chest pain, previous MI, hypertension Respiratory:  Negative for shortness of breath, wheezing, sleep apnea, frequent cough GI:  Negative for nausea, vomiting, bloody stool, GERD  PE: Vitals:   06/17/23 1120  BP: Marland Kitchen)  169/100  Pulse: 89    WDWN  Results: Ua C/W stent urine.  Low level pyuria, microhematuria.  Few bacteria   PROCEDURE: Cystoscopy with stent removal  Indications: Nephrolithiasis with ureteral stent  Description of procedure: Patient was brought to the procedure room where she was correctly identified.  Procedure was again reviewed with patient and informed consent was obtained.  Flexible cystoscopy was subsequently performed.  The stent was  visualized coming from the left ureteral opening and was grasped with a flexible graspers and removed intact without difficulty.  Procedure well-tolerated

## 2023-06-17 NOTE — Progress Notes (Signed)
IM Injection  Patient is present today for an IM Injection for treatment of UTI Drug: Rocephin  Dose:1gm Location:right glute Lot: [id number] Exp:01/2025 Patient tolerated well, no complications were noted  Performed by: Buckley Bradly N., CMA(AAMA)

## 2023-08-03 ENCOUNTER — Ambulatory Visit: Payer: Medicaid Other | Attending: Neurosurgery | Admitting: Physical Therapy

## 2023-09-16 ENCOUNTER — Ambulatory Visit: Payer: Medicaid Other | Admitting: Urology

## 2023-09-27 ENCOUNTER — Telehealth: Payer: Self-pay | Admitting: Urology

## 2023-09-27 NOTE — Telephone Encounter (Signed)
 Called and lvm to see if pt could come any earlier for her apt on Thursday with Dr Margo Aye per Christal.

## 2023-09-30 ENCOUNTER — Ambulatory Visit: Payer: Medicaid Other | Admitting: Urology

## 2024-01-11 ENCOUNTER — Ambulatory Visit: Attending: Neurosurgery

## 2024-01-11 ENCOUNTER — Other Ambulatory Visit: Payer: Self-pay

## 2024-01-11 DIAGNOSIS — M6281 Muscle weakness (generalized): Secondary | ICD-10-CM | POA: Insufficient documentation

## 2024-01-11 DIAGNOSIS — M5459 Other low back pain: Secondary | ICD-10-CM | POA: Insufficient documentation

## 2024-01-11 DIAGNOSIS — R2689 Other abnormalities of gait and mobility: Secondary | ICD-10-CM | POA: Diagnosis present

## 2024-01-11 NOTE — Therapy (Addendum)
 OUTPATIENT PHYSICAL THERAPY THORACOLUMBAR EVALUATION   Patient Name: Suzanne [name] MRN: [id number] DOB:1976/07/15, 48 y.o., female Today's Date: 01/12/2024  END OF SESSION:  PT End of Session - 01/12/24 1109     Visit Number 1    Number of Visits 17    Date for PT Re-Evaluation 03/08/24    Authorization Type [employer] MCD    PT Start Time 1453    PT Stop Time 1530    PT Time Calculation (min) 37 min          Past Medical History:  Diagnosis Date   Anemia    Diabetes mellitus without complication (HCC)    GERD (gastroesophageal reflux disease)    Headache    History of kidney stones    Hypertension    Sciatica    Past Surgical History:  Procedure Laterality Date   CERVICAL BIOPSY     CESAREAN SECTION N/A 05/15/2016   Procedure: CESAREAN SECTION;  Surgeon: Albino Hum, MD;  Location: [facility] BIRTHING SUITES;  Service: Obstetrics;  Laterality: N/A;   COLPOSCOPY     CYSTOSCOPY WITH RETROGRADE PYELOGRAM, URETEROSCOPY AND STENT PLACEMENT Left 05/03/2023   Procedure: CYSTOSCOPY WITH RETROGRADE PYELOGRAM, URETEROSCOPY AND STENT PLACEMENT;  Surgeon: Florencio Hunting, MD;  Location: [facility];  Service: Urology;  Laterality: Left;   EXTRACORPOREAL SHOCK WAVE LITHOTRIPSY Left 05/31/2023   Procedure: EXTRACORPOREAL SHOCK WAVE LITHOTRIPSY (ESWL);  Surgeon: Scarlet Curly, MD;  Location: [facility];  Service: Urology;  Laterality: Left;   LUMBAR LAMINECTOMY/ DECOMPRESSION WITH MET-RX Left 10/19/2022   Procedure: Left Lumbar five-Sacral one Microlaminectomy and discectomy;  Surgeon: Cannon Champion, MD;  Location: [facility] OR;  Service: Neurosurgery;  Laterality: Left;   LUMBAR LAMINECTOMY/ DECOMPRESSION WITH MET-RX Left 04/27/2023   Procedure: Revision Left Minimally Invasive Lumbar Five-Sacral One Discectomy;  Surgeon: Cannon Champion, MD;  Location: [facility] OR;  Service: Neurosurgery;  Laterality: Left;  3C   Patient Active Problem List   Diagnosis Date Noted   Left ureteral  stone 05/03/2023   Intractable pain 05/03/2023   Hypokalemia 05/03/2023   AKI (acute kidney injury) (HCC) 05/02/2023   Herniation of lumbar intervertebral disc with radiculopathy 04/27/2023   GERD (gastroesophageal reflux disease) 01/09/2016   Hypertension, benign 01/09/2016    PCP: Davida Espy, MD  REFERRING PROVIDER: Van Gelinas, MD  REFERRING DIAG: M51.16 (ICD-10-CM) - Intervertebral disc disorders with radiculopathy, lumbar region   Rationale for Evaluation and Treatment: Rehabilitation  THERAPY DIAG:  Other low back pain  Muscle weakness (generalized)  Other abnormalities of gait and mobility  ONSET DATE: Chronic  SUBJECTIVE:  SUBJECTIVE STATEMENT: Pt presents to PT with reports of chronic LBP with referral into bilateral LE, L>R. Also confirms N/T down to lateral L foot. Previous lumbar laminectomy and debridement with no change in status. They discussed lumbar fusion as next step. Denies bowel/bladder changes or saddle anesthesia.   PERTINENT HISTORY:  DM II, HTN, Lumbar surgeries   PAIN:  Are you having pain?  Yes: NPRS scale: 7/10 Worst: 10/10 Pain location: low back, posterior LE L>R Pain description: sharp, N/T Aggravating factors: prolonged standing/sitting, walking Relieving factors: medication   PRECAUTIONS: None  RED FLAGS: None   WEIGHT BEARING RESTRICTIONS: No  FALLS:  Has patient fallen in last 6 months? Yes. Number of falls one - fell off deck leading to increased back pain  LIVING ENVIRONMENT: Lives with: lives with their family Lives in: House/apartment Stairs: at back deck Has following equipment at home: None  OCCUPATION: No   PLOF: Independent  PATIENT GOALS: decrease back pain, improve comfort and functional mobility   OBJECTIVE:   Note: Objective measures were completed at Evaluation unless otherwise noted.  DIAGNOSTIC FINDINGS:  See imaging   PATIENT SURVEYS:  ODI: 37/50  COGNITION: Overall cognitive status: Within functional limits for tasks assessed     SENSATION: Light touch: Impaired L LE  MUSCLE LENGTH: Thomas test: Right (+); Left (+)  POSTURE: rounded shoulders, forward head, and increased lumbar lordosis  PALPATION: TTP to L gluteals, L lumbar paraspinals  LUMBAR ROM:   AROM eval  Flexion 25%  Extension 25%  Right lateral flexion   Left lateral flexion   Right rotation 50%  Left rotation 50%   (Blank rows = not tested)  LOWER EXTREMITY MMT:    MMT Right eval Left eval  Hip flexion 3+ 3+  Hip extension    Hip abduction 3+ 3+  Hip adduction    Hip internal rotation    Hip external rotation    Knee flexion 4 3+  Knee extension 4 3+  Ankle dorsiflexion    Ankle plantarflexion    Ankle inversion    Ankle eversion     (Blank rows = not tested)  LUMBAR SPECIAL TESTS:  Straight leg raise test: Negative and Slump test: Negative  FUNCTIONAL TESTS:  30 Second Sit to Stand: 2 reps  GAIT: Distance walked: 13ft Assistive device utilized: None Level of assistance: SBA Comments: antalgic gait L, flexed trunk  TREATMENT: [facility] Adult PT Treatment:                                                DATE: 01/12/2024 Therapeutic Exercise: Supine sciatic nerve glide x 5 L Supine PPT x 5 - 5 hold Hooklying clamshell x 10 GTB  PATIENT EDUCATION:  Education details: eval findings, ODI, HEP, POC Person educated: Patient Education method: Explanation, Demonstration, and Handouts Education comprehension: verbalized understanding and returned demonstration  HOME EXERCISE PROGRAM: Access Code: [email or web address] URL: [email or web address] Date: 01/11/2024 Prepared by: Loral Roch  Exercises - Supine Sciatic Nerve Glide  - 1 x daily - 7 x weekly - 3 sets - 10 reps - Supine  Posterior Pelvic Tilt  - 1 x daily - 7 x weekly - 2 sets - 10 reps - 5 sec hold - Hooklying Clamshell with Resistance  - 1 x daily - 7 x weekly - 3 sets - 10 reps - green band hold  ASSESSMENT:  CLINICAL IMPRESSION: Patient is a 48 y.o. F who was seen today for physical therapy evaluation and treatment for chronic LBP with referral into bilateral LE L>R. Physical findings are consistent with MD impression as pt has significant weakness and functional mobility deficits and can not sit comfortably without considerable weight shift. ODI score shows severe disability in performance of home ADLs and community activities. Pt would benefit from skilled PT, will continue to progress as able per POC.    OBJECTIVE IMPAIRMENTS: Abnormal gait, decreased activity tolerance, decreased mobility, difficulty walking, decreased ROM, decreased strength, postural dysfunction, and pain  ACTIVITY LIMITATIONS: carrying, lifting, standing, squatting, sleeping, stairs, transfers, reach over head, hygiene/grooming, and locomotion level  PARTICIPATION LIMITATIONS: meal prep, cleaning, driving, shopping, community activity, occupation, and yard work  PERSONAL FACTORS: Time since onset of injury/illness/exacerbation and 3+ comorbidities: DM II, HTN, Lumbar surgeries  are also affecting patient's functional outcome.   REHAB POTENTIAL: Fair - chronicity of condition and lumbar surgeries may affect prognosis  CLINICAL DECISION MAKING: Evolving/moderate complexity  EVALUATION COMPLEXITY: Moderate   GOALS: Goals reviewed with patient? No  SHORT TERM GOALS: Target date: 02/01/2024   Pt will be compliant and knowledgeable with initial HEP for improved comfort and carryover Baseline: initial HEP given  Goal status: INITIAL  2.  Pt will self report back and LE pain no greater than 6/10 for improved comfort and functional ability Baseline: 10/10 at worst Goal status: INITIAL   LONG TERM GOALS: Target date:  03/08/2024  Pt will be decrease ODI disability score to no greater than 60% (30/50) as proxy for functional improvement Baseline: 74% disability (37/50) Goal status: INITIAL  2.  Pt will self report back and LE pain no greater than 3/10 for improved comfort and functional ability Baseline: 10/10 at worst Goal status: INITIAL   3.  Pt will increase 30 Second Sit to Stand rep count to no less than 5 reps for improved balance, strength, and functional mobility Baseline: 2 reps with UE Goal status: INITIAL   4.  Pt will improve bilateral hip abd/flex MMT to at least 4/5 for improving functional mobility and decreasing pain Baseline: see MMT chart Goal status: INITIAL   PLAN:  PT FREQUENCY: 1-2x/week  PT DURATION: 8 weeks  PLANNED INTERVENTIONS: 97164- PT Re-evaluation, 97110-Therapeutic exercises, 97530- Therapeutic activity, [id number]- Neuromuscular re-education, 97535- Self Care, 16109- Manual therapy, [id number]- Gait training, U0454- Electrical stimulation (unattended), [id number]- Electrical stimulation (manual), 97016- Vasopneumatic device, 20560 (1-2 muscles), 20561 (3+ muscles)- Dry Needling, Cryotherapy, and Moist heat.  PLAN FOR NEXT SESSION: assess HEP response, core/hip strengthening    Ivor Mars, PT 01/12/2024, 1:28 PM

## 2024-01-12 NOTE — Addendum Note (Signed)
 Addended by: Ivor Mars on: 01/12/2024 01:29 PM   Modules accepted: Orders

## 2024-01-20 ENCOUNTER — Ambulatory Visit

## 2024-01-20 NOTE — Therapy (Incomplete)
 OUTPATIENT PHYSICAL THERAPY THORACOLUMBAR EVALUATION   Patient Name: Suzanne [name] MRN: [id number] DOB:September 01, 1975, 48 y.o., female, female Today's Date: 01/20/2024  END OF SESSION:    Past Medical History:  Diagnosis Date   Anemia    Diabetes mellitus without complication (HCC)    GERD (gastroesophageal reflux disease)    Headache    History of kidney stones    Hypertension    Sciatica    Past Surgical History:  Procedure Laterality Date   CERVICAL BIOPSY     CESAREAN SECTION N/A 05/15/2016   Procedure: CESAREAN SECTION;  Surgeon: Norleen LULLA Server, MD;  Location: [facility] BIRTHING SUITES;  Service: Obstetrics;  Laterality: N/A;   COLPOSCOPY     CYSTOSCOPY WITH RETROGRADE PYELOGRAM, URETEROSCOPY AND STENT PLACEMENT Left 05/03/2023   Procedure: CYSTOSCOPY WITH RETROGRADE PYELOGRAM, URETEROSCOPY AND STENT PLACEMENT;  Surgeon: Renda Glance, MD;  Location: [facility];  Service: Urology;  Laterality: Left;   EXTRACORPOREAL SHOCK WAVE LITHOTRIPSY Left 05/31/2023   Procedure: EXTRACORPOREAL SHOCK WAVE LITHOTRIPSY (ESWL);  Surgeon: Shona Layman BROCKS, MD;  Location: [facility];  Service: Urology;  Laterality: Left;   LUMBAR LAMINECTOMY/ DECOMPRESSION WITH MET-RX Left 10/19/2022   Procedure: Left Lumbar five-Sacral one Microlaminectomy and discectomy;  Surgeon: Cheryle Debby LABOR, MD;  Location: [facility];  Service: Neurosurgery;  Laterality: Left;   LUMBAR LAMINECTOMY/ DECOMPRESSION WITH MET-RX Left 04/27/2023   Procedure: Revision Left Minimally Invasive Lumbar Five-Sacral One Discectomy;  Surgeon: Cheryle Debby LABOR, MD;  Location: [facility];  Service: Neurosurgery;  Laterality: Left;  3C   Patient Active Problem List   Diagnosis Date Noted   Left ureteral stone 05/03/2023   Intractable pain 05/03/2023   Hypokalemia 05/03/2023   AKI (acute kidney injury) (HCC) 05/02/2023   Herniation of lumbar intervertebral disc with radiculopathy 04/27/2023   GERD (gastroesophageal reflux disease)  01/09/2016   Hypertension, benign 01/09/2016    PCP: Sim Emery CROME, MD  REFERRING PROVIDER: Debby Dorn MATSU, MD  REFERRING DIAG: M51.16 (ICD-10-CM) - Intervertebral disc disorders with radiculopathy, lumbar region   Rationale for Evaluation and Treatment: Rehabilitation  THERAPY DIAG:  No diagnosis found.  ONSET DATE: Chronic  SUBJECTIVE:                                                                                                                                                                                           SUBJECTIVE STATEMENT: ***  EVAL: Pt presents to PT with reports of chronic LBP with referral into bilateral LE, L>R. Also confirms N/T down to lateral L foot. Previous lumbar laminectomy and debridement with no change in status. They discussed lumbar fusion as next  step. Denies bowel/bladder changes or saddle anesthesia.   PERTINENT HISTORY:  DM II, HTN, Lumbar surgeries   PAIN:  Are you having pain?  Yes: NPRS scale: 7/10 Worst: 10/10 Pain location: low back, posterior LE L>R Pain description: sharp, N/T Aggravating factors: prolonged standing/sitting, walking Relieving factors: medication   PRECAUTIONS: None  RED FLAGS: None   WEIGHT BEARING RESTRICTIONS: No  FALLS:  Has patient fallen in last 6 months? Yes. Number of falls one - fell off deck leading to increased back pain  LIVING ENVIRONMENT: Lives with: lives with their family Lives in: House/apartment Stairs: at back deck Has following equipment at home: None  OCCUPATION: No   PLOF: Independent  PATIENT GOALS: decrease back pain, improve comfort and functional mobility   OBJECTIVE:  Note: Objective measures were completed at Evaluation unless otherwise noted.  DIAGNOSTIC FINDINGS:  See imaging   PATIENT SURVEYS:  ODI: 37/50  COGNITION: Overall cognitive status: Within functional limits for tasks assessed     SENSATION: Light touch: Impaired L LE  MUSCLE  LENGTH: Thomas test: Right (+); Left (+)  POSTURE: rounded shoulders, forward head, and increased lumbar lordosis  PALPATION: TTP to L gluteals, L lumbar paraspinals  LUMBAR ROM:   AROM eval  Flexion 25%  Extension 25%  Right lateral flexion   Left lateral flexion   Right rotation 50%  Left rotation 50%   (Blank rows = not tested)  LOWER EXTREMITY MMT:    MMT Right eval Left eval  Hip flexion 3+ 3+  Hip extension    Hip abduction 3+ 3+  Hip adduction    Hip internal rotation    Hip external rotation    Knee flexion 4 3+  Knee extension 4 3+  Ankle dorsiflexion    Ankle plantarflexion    Ankle inversion    Ankle eversion     (Blank rows = not tested)  LUMBAR SPECIAL TESTS:  Straight leg raise test: Negative and Slump test: Negative  FUNCTIONAL TESTS:  30 Second Sit to Stand: 2 reps  GAIT: Distance walked: 73ft Assistive device utilized: None Level of assistance: SBA Comments: antalgic gait L, flexed trunk  TREATMENT: [facility] Adult PT Treatment:                                                DATE: 01/12/2024 Therapeutic Exercise: Supine sciatic nerve glide x 5 L Supine PPT x 5 - 5 hold Hooklying clamshell x 10 GTB  PATIENT EDUCATION:  Education details: eval findings, ODI, HEP, POC Person educated: Patient Education method: Explanation, Demonstration, and Handouts Education comprehension: verbalized understanding and returned demonstration  HOME EXERCISE PROGRAM: Access Code: [email or web address] URL: [email or web address] Date: 01/11/2024 Prepared by: Alm Kingdom  Exercises - Supine Sciatic Nerve Glide  - 1 x daily - 7 x weekly - 3 sets - 10 reps - Supine Posterior Pelvic Tilt  - 1 x daily - 7 x weekly - 2 sets - 10 reps - 5 sec hold - Hooklying Clamshell with Resistance  - 1 x daily - 7 x weekly - 3 sets - 10 reps - green band hold  ASSESSMENT:  CLINICAL IMPRESSION: ***  EVAL: Patient is a 48 y.o. F who was seen today for physical therapy  evaluation and treatment for chronic LBP with referral into bilateral LE L>R. Physical findings are consistent with MD impression as pt  has significant weakness and functional mobility deficits and can not sit comfortably without considerable weight shift. ODI score shows severe disability in performance of home ADLs and community activities. Pt would benefit from skilled PT, will continue to progress as able per POC.    OBJECTIVE IMPAIRMENTS: Abnormal gait, decreased activity tolerance, decreased mobility, difficulty walking, decreased ROM, decreased strength, postural dysfunction, and pain  ACTIVITY LIMITATIONS: carrying, lifting, standing, squatting, sleeping, stairs, transfers, reach over head, hygiene/grooming, and locomotion level  PARTICIPATION LIMITATIONS: meal prep, cleaning, driving, shopping, community activity, occupation, and yard work  PERSONAL FACTORS: Time since onset of injury/illness/exacerbation and 3+ comorbidities: DM II, HTN, Lumbar surgeries  are also affecting patient's functional outcome.   REHAB POTENTIAL: Fair - chronicity of condition and lumbar surgeries may affect prognosis  CLINICAL DECISION MAKING: Evolving/moderate complexity  EVALUATION COMPLEXITY: Moderate   GOALS: Goals reviewed with patient? No  SHORT TERM GOALS: Target date: 02/01/2024   Pt will be compliant and knowledgeable with initial HEP for improved comfort and carryover Baseline: initial HEP given  Goal status: INITIAL  2.  Pt will self report back and LE pain no greater than 6/10 for improved comfort and functional ability Baseline: 10/10 at worst Goal status: INITIAL   LONG TERM GOALS: Target date: 03/08/2024  Pt will be decrease ODI disability score to no greater than 60% (30/50) as proxy for functional improvement Baseline: 74% disability (37/50) Goal status: INITIAL  2.  Pt will self report back and LE pain no greater than 3/10 for improved comfort and functional ability Baseline:  10/10 at worst Goal status: INITIAL   3.  Pt will increase 30 Second Sit to Stand rep count to no less than 5 reps for improved balance, strength, and functional mobility Baseline: 2 reps with UE Goal status: INITIAL   4.  Pt will improve bilateral hip abd/flex MMT to at least 4/5 for improving functional mobility and decreasing pain Baseline: see MMT chart Goal status: INITIAL   PLAN:  PT FREQUENCY: 1-2x/week  PT DURATION: 8 weeks  PLANNED INTERVENTIONS: 97164- PT Re-evaluation, 97110-Therapeutic exercises, 97530- Therapeutic activity, [id number]- Neuromuscular re-education, 97535- Self Care, [id number]- Manual therapy, [id number]- Gait training, H9716- Electrical stimulation (unattended), [id number]- Electrical stimulation (manual), 97016- Vasopneumatic device, 20560 (1-2 muscles), 20561 (3+ muscles)- Dry Needling, Cryotherapy, and Moist heat.  PLAN FOR NEXT SESSION: assess HEP response, core/hip strengthening    Alm JAYSON Kingdom, PT 01/20/2024, 8:20 AM

## 2024-01-24 ENCOUNTER — Ambulatory Visit

## 2024-01-24 DIAGNOSIS — M5459 Other low back pain: Secondary | ICD-10-CM | POA: Diagnosis not present

## 2024-01-24 DIAGNOSIS — R2689 Other abnormalities of gait and mobility: Secondary | ICD-10-CM

## 2024-01-24 DIAGNOSIS — M6281 Muscle weakness (generalized): Secondary | ICD-10-CM

## 2024-01-24 NOTE — Therapy (Signed)
 OUTPATIENT PHYSICAL THERAPY TREATMENT   Patient Name: Suzanne [name] MRN: [id number] DOB:Dec 07, 1975, 48 y.o., female Today's Date: 01/25/2024  END OF SESSION:  PT End of Session - 01/24/24 1528     Visit Number 2    Number of Visits 17    Date for PT Re-Evaluation 03/08/24    Authorization Type [employer] MCD    PT Start Time 1530    PT Stop Time 1610    PT Time Calculation (min) 40 min           Past Medical History:  Diagnosis Date   Anemia    Diabetes mellitus without complication (HCC)    GERD (gastroesophageal reflux disease)    Headache    History of kidney stones    Hypertension    Sciatica    Past Surgical History:  Procedure Laterality Date   CERVICAL BIOPSY     CESAREAN SECTION N/A 05/15/2016   Procedure: CESAREAN SECTION;  Surgeon: Norleen LULLA Server, MD;  Location: [facility] BIRTHING SUITES;  Service: Obstetrics;  Laterality: N/A;   COLPOSCOPY     CYSTOSCOPY WITH RETROGRADE PYELOGRAM, URETEROSCOPY AND STENT PLACEMENT Left 05/03/2023   Procedure: CYSTOSCOPY WITH RETROGRADE PYELOGRAM, URETEROSCOPY AND STENT PLACEMENT;  Surgeon: Renda Glance, MD;  Location: [facility];  Service: Urology;  Laterality: Left;   EXTRACORPOREAL SHOCK WAVE LITHOTRIPSY Left 05/31/2023   Procedure: EXTRACORPOREAL SHOCK WAVE LITHOTRIPSY (ESWL);  Surgeon: Shona Layman BROCKS, MD;  Location: [facility];  Service: Urology;  Laterality: Left;   LUMBAR LAMINECTOMY/ DECOMPRESSION WITH MET-RX Left 10/19/2022   Procedure: Left Lumbar five-Sacral one Microlaminectomy and discectomy;  Surgeon: Cheryle Debby LABOR, MD;  Location: [facility];  Service: Neurosurgery;  Laterality: Left;   LUMBAR LAMINECTOMY/ DECOMPRESSION WITH MET-RX Left 04/27/2023   Procedure: Revision Left Minimally Invasive Lumbar Five-Sacral One Discectomy;  Surgeon: Cheryle Debby LABOR, MD;  Location: [facility];  Service: Neurosurgery;  Laterality: Left;  3C   Patient Active Problem List   Diagnosis Date Noted   Left ureteral stone 05/03/2023    Intractable pain 05/03/2023   Hypokalemia 05/03/2023   AKI (acute kidney injury) (HCC) 05/02/2023   Herniation of lumbar intervertebral disc with radiculopathy 04/27/2023   GERD (gastroesophageal reflux disease) 01/09/2016   Hypertension, benign 01/09/2016    PCP: Sim Emery CROME, MD  REFERRING PROVIDER: Debby Dorn MATSU, MD  REFERRING DIAG: M51.16 (ICD-10-CM) - Intervertebral disc disorders with radiculopathy, lumbar region   Rationale for Evaluation and Treatment: Rehabilitation  THERAPY DIAG:  Other low back pain  Muscle weakness (generalized)  Other abnormalities of gait and mobility  ONSET DATE: Chronic  SUBJECTIVE:  SUBJECTIVE STATEMENT: Pt presents to PT with reports of continued severe pain. Currently 10/10 and she has had some difficulty with HEP.  EVAL: Pt presents to PT with reports of chronic LBP with referral into bilateral LE, L>R. Also confirms N/T down to lateral L foot. Previous lumbar laminectomy and debridement with no change in status. They discussed lumbar fusion as next step. Denies bowel/bladder changes or saddle anesthesia.   PERTINENT HISTORY:  DM II, HTN, Lumbar surgeries   PAIN:  Are you having pain?  Yes: NPRS scale: 7/10 Worst: 10/10 Pain location: low back, posterior LE L>R Pain description: sharp, N/T Aggravating factors: prolonged standing/sitting, walking Relieving factors: medication   PRECAUTIONS: None  RED FLAGS: None   WEIGHT BEARING RESTRICTIONS: No  FALLS:  Has patient fallen in last 6 months? Yes. Number of falls one - fell off deck leading to increased back pain  LIVING ENVIRONMENT: Lives with: lives with their family Lives in: House/apartment Stairs: at back deck Has following equipment at home: None  OCCUPATION: No   PLOF:  Independent  PATIENT GOALS: decrease back pain, improve comfort and functional mobility   OBJECTIVE:  Note: Objective measures were completed at Evaluation unless otherwise noted.  DIAGNOSTIC FINDINGS:  See imaging   PATIENT SURVEYS:  ODI: 37/50  COGNITION: Overall cognitive status: Within functional limits for tasks assessed     SENSATION: Light touch: Impaired L LE  MUSCLE LENGTH: Thomas test: Right (+); Left (+)  POSTURE: rounded shoulders, forward head, and increased lumbar lordosis  PALPATION: TTP to L gluteals, L lumbar paraspinals  LUMBAR ROM:   AROM eval  Flexion 25%  Extension 25%  Right lateral flexion   Left lateral flexion   Right rotation 50%  Left rotation 50%   (Blank rows = not tested)  LOWER EXTREMITY MMT:    MMT Right eval Left eval  Hip flexion 3+ 3+  Hip extension    Hip abduction 3+ 3+  Hip adduction    Hip internal rotation    Hip external rotation    Knee flexion 4 3+  Knee extension 4 3+  Ankle dorsiflexion    Ankle plantarflexion    Ankle inversion    Ankle eversion     (Blank rows = not tested)  LUMBAR SPECIAL TESTS:  Straight leg raise test: Negative and Slump test: Negative  FUNCTIONAL TESTS:  30 Second Sit to Stand: 2 reps  GAIT: Distance walked: 59ft Assistive device utilized: None Level of assistance: SBA Comments: antalgic gait L, flexed trunk  TREATMENT: [facility] Adult PT Treatment:                                                DATE: 01/24/2024 NuStep - unable due to pain  Supine PPT 2x5 - 5 hold Supine PPT with ball 2x5  LTR x 10  Hooklying clamshell x 15 GTB Standing lumbar ext at counter x 5 Manual Therapy:  Supine lumbar traction using physioball L LE distraction Grade II Modalities:  MHP to lumbar spine during supine exercises   [facility] Adult PT Treatment:                                                DATE: 01/12/2024 Therapeutic  Exercise: Supine sciatic nerve glide x 5 L Supine PPT x 5 - 5  hold Hooklying clamshell x 10 GTB  PATIENT EDUCATION:  Education details: eval findings, ODI, HEP, POC Person educated: Patient Education method: Explanation, Demonstration, and Handouts Education comprehension: verbalized understanding and returned demonstration  HOME EXERCISE PROGRAM: Access Code: [email or web address] URL: [email or web address] Date: 01/11/2024 Prepared by: Alm Kingdom  Exercises - Supine Sciatic Nerve Glide  - 1 x daily - 7 x weekly - 3 sets - 10 reps - Supine Posterior Pelvic Tilt  - 1 x daily - 7 x weekly - 2 sets - 10 reps - 5 sec hold - Hooklying Clamshell with Resistance  - 1 x daily - 7 x weekly - 3 sets - 10 reps - green band hold  ASSESSMENT:  CLINICAL IMPRESSION: Pt tolerated treatment fair but was limited by continued pain and neural tension. Did note some reduction of symptoms after manual therapy. Pt has MD visit this week, will continue to assess therapeutic benefit. Progress as able.   EVAL: Patient is a 48 y.o. F who was seen today for physical therapy evaluation and treatment for chronic LBP with referral into bilateral LE L>R. Physical findings are consistent with MD impression as pt has significant weakness and functional mobility deficits and can not sit comfortably without considerable weight shift. ODI score shows severe disability in performance of home ADLs and community activities. Pt would benefit from skilled PT, will continue to progress as able per POC.    OBJECTIVE IMPAIRMENTS: Abnormal gait, decreased activity tolerance, decreased mobility, difficulty walking, decreased ROM, decreased strength, postural dysfunction, and pain  ACTIVITY LIMITATIONS: carrying, lifting, standing, squatting, sleeping, stairs, transfers, reach over head, hygiene/grooming, and locomotion level  PARTICIPATION LIMITATIONS: meal prep, cleaning, driving, shopping, community activity, occupation, and yard work  PERSONAL FACTORS: Time since onset of  injury/illness/exacerbation and 3+ comorbidities: DM II, HTN, Lumbar surgeries  are also affecting patient's functional outcome.   REHAB POTENTIAL: Fair - chronicity of condition and lumbar surgeries may affect prognosis  CLINICAL DECISION MAKING: Evolving/moderate complexity  EVALUATION COMPLEXITY: Moderate   GOALS: Goals reviewed with patient? No  SHORT TERM GOALS: Target date: 02/01/2024   Pt will be compliant and knowledgeable with initial HEP for improved comfort and carryover Baseline: initial HEP given  Goal status: INITIAL  2.  Pt will self report back and LE pain no greater than 6/10 for improved comfort and functional ability Baseline: 10/10 at worst Goal status: INITIAL   LONG TERM GOALS: Target date: 03/08/2024  Pt will be decrease ODI disability score to no greater than 60% (30/50) as proxy for functional improvement Baseline: 74% disability (37/50) Goal status: INITIAL  2.  Pt will self report back and LE pain no greater than 3/10 for improved comfort and functional ability Baseline: 10/10 at worst Goal status: INITIAL   3.  Pt will increase 30 Second Sit to Stand rep count to no less than 5 reps for improved balance, strength, and functional mobility Baseline: 2 reps with UE Goal status: INITIAL   4.  Pt will improve bilateral hip abd/flex MMT to at least 4/5 for improving functional mobility and decreasing pain Baseline: see MMT chart Goal status: INITIAL   PLAN:  PT FREQUENCY: 1-2x/week  PT DURATION: 8 weeks  PLANNED INTERVENTIONS: 97164- PT Re-evaluation, 97110-Therapeutic exercises, 97530- Therapeutic activity, [id number]- Neuromuscular re-education, 97535- Self Care, 02859- Manual therapy, [id number]- Gait training, H9716- Electrical stimulation (unattended), [id number]- Electrical stimulation (manual), [id number]- Vasopneumatic device, [id number] (1-2 muscles), 79438 (  3+ muscles)- Dry Needling, Cryotherapy, and Moist heat.  PLAN FOR NEXT SESSION: assess HEP response,  core/hip strengthening    Alm JAYSON Kingdom, PT 01/25/2024, 8:02 AM

## 2024-01-26 ENCOUNTER — Ambulatory Visit

## 2024-01-26 NOTE — Therapy (Incomplete)
 OUTPATIENT PHYSICAL THERAPY TREATMENT   Patient Name: Suzanne [name] MRN: [id number] DOB:September 21, 1975, 48 y.o., female Today's Date: 01/26/2024  END OF SESSION:     Past Medical History:  Diagnosis Date   Anemia    Diabetes mellitus without complication (HCC)    GERD (gastroesophageal reflux disease)    Headache    History of kidney stones    Hypertension    Sciatica    Past Surgical History:  Procedure Laterality Date   CERVICAL BIOPSY     CESAREAN SECTION N/A 05/15/2016   Procedure: CESAREAN SECTION;  Surgeon: Norleen LULLA Server, MD;  Location: [facility];  Service: Obstetrics;  Laterality: N/A;   COLPOSCOPY     CYSTOSCOPY WITH RETROGRADE PYELOGRAM, URETEROSCOPY AND STENT PLACEMENT Left 05/03/2023   Procedure: CYSTOSCOPY WITH RETROGRADE PYELOGRAM, URETEROSCOPY AND STENT PLACEMENT;  Surgeon: Renda Glance, MD;  Location: [facility];  Service: Urology;  Laterality: Left;   EXTRACORPOREAL SHOCK WAVE LITHOTRIPSY Left 05/31/2023   Procedure: EXTRACORPOREAL SHOCK WAVE LITHOTRIPSY (ESWL);  Surgeon: Shona Layman BROCKS, MD;  Location: [facility];  Service: Urology;  Laterality: Left;   LUMBAR LAMINECTOMY/ DECOMPRESSION WITH MET-RX Left 10/19/2022   Procedure: Left Lumbar five-Sacral one Microlaminectomy and discectomy;  Surgeon: Cheryle Debby LABOR, MD;  Location: [facility];  Service: Neurosurgery;  Laterality: Left;   LUMBAR LAMINECTOMY/ DECOMPRESSION WITH MET-RX Left 04/27/2023   Procedure: Revision Left Minimally Invasive Lumbar Five-Sacral One Discectomy;  Surgeon: Cheryle Debby LABOR, MD;  Location: [facility];  Service: Neurosurgery;  Laterality: Left;  3C   Patient Active Problem List   Diagnosis Date Noted   Left ureteral stone 05/03/2023   Intractable pain 05/03/2023   Hypokalemia 05/03/2023   AKI (acute kidney injury) (HCC) 05/02/2023   Herniation of lumbar intervertebral disc with radiculopathy 04/27/2023   GERD (gastroesophageal reflux disease) 01/09/2016    Hypertension, benign 01/09/2016    PCP: Sim Emery CROME, MD  REFERRING PROVIDER: Debby Dorn MATSU, MD  REFERRING DIAG: M51.16 (ICD-10-CM) - Intervertebral disc disorders with radiculopathy, lumbar region   Rationale for Evaluation and Treatment: Rehabilitation  THERAPY DIAG:  No diagnosis found.  ONSET DATE: Chronic  SUBJECTIVE:                                                                                                                                                                                           SUBJECTIVE STATEMENT: ***  EVAL: Pt presents to PT with reports of chronic LBP with referral into bilateral LE, L>R. Also confirms N/T down to lateral L foot. Previous lumbar laminectomy and debridement with no change in status. They discussed lumbar fusion as next  step. Denies bowel/bladder changes or saddle anesthesia.   PERTINENT HISTORY:  DM II, HTN, Lumbar surgeries   PAIN:  Are you having pain?  Yes: NPRS scale: 7/10 Worst: 10/10 Pain location: low back, posterior LE L>R Pain description: sharp, N/T Aggravating factors: prolonged standing/sitting, walking Relieving factors: medication   PRECAUTIONS: None  RED FLAGS: None   WEIGHT BEARING RESTRICTIONS: No  FALLS:  Has patient fallen in last 6 months? Yes. Number of falls one - fell off deck leading to increased back pain  LIVING ENVIRONMENT: Lives with: lives with their family Lives in: House/apartment Stairs: at back deck Has following equipment at home: None  OCCUPATION: No   PLOF: Independent  PATIENT GOALS: decrease back pain, improve comfort and functional mobility   OBJECTIVE:  Note: Objective measures were completed at Evaluation unless otherwise noted.  DIAGNOSTIC FINDINGS:  See imaging   PATIENT SURVEYS:  ODI: 37/50  COGNITION: Overall cognitive status: Within functional limits for tasks assessed     SENSATION: Light touch: Impaired L LE  MUSCLE LENGTH: Thomas test:  Right (+); Left (+)  POSTURE: rounded shoulders, forward head, and increased lumbar lordosis  PALPATION: TTP to L gluteals, L lumbar paraspinals  LUMBAR ROM:   AROM eval  Flexion 25%  Extension 25%  Right lateral flexion   Left lateral flexion   Right rotation 50%  Left rotation 50%   (Blank rows = not tested)  LOWER EXTREMITY MMT:    MMT Right eval Left eval  Hip flexion 3+ 3+  Hip extension    Hip abduction 3+ 3+  Hip adduction    Hip internal rotation    Hip external rotation    Knee flexion 4 3+  Knee extension 4 3+  Ankle dorsiflexion    Ankle plantarflexion    Ankle inversion    Ankle eversion     (Blank rows = not tested)  LUMBAR SPECIAL TESTS:  Straight leg raise test: Negative and Slump test: Negative  FUNCTIONAL TESTS:  30 Second Sit to Stand: 2 reps  GAIT: Distance walked: 44ft Assistive device utilized: None Level of assistance: SBA Comments: antalgic gait L, flexed trunk  TREATMENT: [facility] Adult PT Treatment:                                                DATE: 01/24/2024 NuStep - unable due to pain  Supine PPT 2x5 - 5 hold Supine PPT with ball 2x5  LTR x 10  Hooklying clamshell x 15 GTB Standing lumbar ext at counter x 5 Manual Therapy:  Supine lumbar traction using physioball L LE distraction Grade II Modalities:  MHP to lumbar spine during supine exercises   [facility] Adult PT Treatment:                                                DATE: 01/12/2024 Therapeutic Exercise: Supine sciatic nerve glide x 5 L Supine PPT x 5 - 5 hold Hooklying clamshell x 10 GTB  PATIENT EDUCATION:  Education details: eval findings, ODI, HEP, POC Person educated: Patient Education method: Explanation, Demonstration, and Handouts Education comprehension: verbalized understanding and returned demonstration  HOME EXERCISE PROGRAM: Access Code: [email or web address] URL: [email or web address] Date: 01/11/2024 Prepared by: Alm Kingdom  Exercises -  Supine Sciatic Nerve Glide  - 1 x daily - 7 x weekly - 3 sets - 10 reps - Supine Posterior Pelvic Tilt  - 1 x daily - 7 x weekly - 2 sets - 10 reps - 5 sec hold - Hooklying Clamshell with Resistance  - 1 x daily - 7 x weekly - 3 sets - 10 reps - green band hold  ASSESSMENT:  CLINICAL IMPRESSION: ***  EVAL: Patient is a 48 y.o. F who was seen today for physical therapy evaluation and treatment for chronic LBP with referral into bilateral LE L>R. Physical findings are consistent with MD impression as pt has significant weakness and functional mobility deficits and can not sit comfortably without considerable weight shift. ODI score shows severe disability in performance of home ADLs and community activities. Pt would benefit from skilled PT, will continue to progress as able per POC.    OBJECTIVE IMPAIRMENTS: Abnormal gait, decreased activity tolerance, decreased mobility, difficulty walking, decreased ROM, decreased strength, postural dysfunction, and pain  ACTIVITY LIMITATIONS: carrying, lifting, standing, squatting, sleeping, stairs, transfers, reach over head, hygiene/grooming, and locomotion level  PARTICIPATION LIMITATIONS: meal prep, cleaning, driving, shopping, community activity, occupation, and yard work  PERSONAL FACTORS: Time since onset of injury/illness/exacerbation and 3+ comorbidities: DM II, HTN, Lumbar surgeries  are also affecting patient's functional outcome.   REHAB POTENTIAL: Fair - chronicity of condition and lumbar surgeries may affect prognosis  CLINICAL DECISION MAKING: Evolving/moderate complexity  EVALUATION COMPLEXITY: Moderate   GOALS: Goals reviewed with patient? No  SHORT TERM GOALS: Target date: 02/01/2024   Pt will be compliant and knowledgeable with initial HEP for improved comfort and carryover Baseline: initial HEP given  Goal status: INITIAL  2.  Pt will self report back and LE pain no greater than 6/10 for improved comfort and functional  ability Baseline: 10/10 at worst Goal status: INITIAL   LONG TERM GOALS: Target date: 03/08/2024  Pt will be decrease ODI disability score to no greater than 60% (30/50) as proxy for functional improvement Baseline: 74% disability (37/50) Goal status: INITIAL  2.  Pt will self report back and LE pain no greater than 3/10 for improved comfort and functional ability Baseline: 10/10 at worst Goal status: INITIAL   3.  Pt will increase 30 Second Sit to Stand rep count to no less than 5 reps for improved balance, strength, and functional mobility Baseline: 2 reps with UE Goal status: INITIAL   4.  Pt will improve bilateral hip abd/flex MMT to at least 4/5 for improving functional mobility and decreasing pain Baseline: see MMT chart Goal status: INITIAL   PLAN:  PT FREQUENCY: 1-2x/week  PT DURATION: 8 weeks  PLANNED INTERVENTIONS: 97164- PT Re-evaluation, 97110-Therapeutic exercises, 97530- Therapeutic activity, [id number]- Neuromuscular re-education, 97535- Self Care, [id number]- Manual therapy, [id number]- Gait training, H9716- Electrical stimulation (unattended), [id number]- Electrical stimulation (manual), 97016- Vasopneumatic device, 20560 (1-2 muscles), 20561 (3+ muscles)- Dry Needling, Cryotherapy, and Moist heat.  PLAN FOR NEXT SESSION: assess HEP response, core/hip strengthening    Alm JAYSON Kingdom, PT 01/26/2024, 8:15 AM

## 2024-01-31 ENCOUNTER — Telehealth: Payer: Self-pay

## 2024-01-31 ENCOUNTER — Ambulatory Visit: Attending: Neurosurgery

## 2024-01-31 DIAGNOSIS — M6281 Muscle weakness (generalized): Secondary | ICD-10-CM | POA: Insufficient documentation

## 2024-01-31 DIAGNOSIS — R2689 Other abnormalities of gait and mobility: Secondary | ICD-10-CM | POA: Insufficient documentation

## 2024-01-31 DIAGNOSIS — M5459 Other low back pain: Secondary | ICD-10-CM | POA: Insufficient documentation

## 2024-01-31 NOTE — Telephone Encounter (Signed)
 PT called and left voicemail message about missed appointment. Left reminder of attendance policy and of next appointment.  [name]   01/31/24 4:02 PM

## 2024-01-31 NOTE — Therapy (Incomplete)
 OUTPATIENT PHYSICAL THERAPY TREATMENT   Patient Name: Suzanne [name] MRN: [id number] DOB:November 12, 1975, 48 y.o., female Today's Date: 01/31/2024  END OF SESSION:     Past Medical History:  Diagnosis Date   Anemia    Diabetes mellitus without complication (HCC)    GERD (gastroesophageal reflux disease)    Headache    History of kidney stones    Hypertension    Sciatica    Past Surgical History:  Procedure Laterality Date   CERVICAL BIOPSY     CESAREAN SECTION N/A 05/15/2016   Procedure: CESAREAN SECTION;  Surgeon: Norleen LULLA Server, MD;  Location: [facility];  Service: Obstetrics;  Laterality: N/A;   COLPOSCOPY     CYSTOSCOPY WITH RETROGRADE PYELOGRAM, URETEROSCOPY AND STENT PLACEMENT Left 05/03/2023   Procedure: CYSTOSCOPY WITH RETROGRADE PYELOGRAM, URETEROSCOPY AND STENT PLACEMENT;  Surgeon: Renda Glance, MD;  Location: [facility];  Service: Urology;  Laterality: Left;   EXTRACORPOREAL SHOCK WAVE LITHOTRIPSY Left 05/31/2023   Procedure: EXTRACORPOREAL SHOCK WAVE LITHOTRIPSY (ESWL);  Surgeon: Shona Layman BROCKS, MD;  Location: [facility];  Service: Urology;  Laterality: Left;   LUMBAR LAMINECTOMY/ DECOMPRESSION WITH MET-RX Left 10/19/2022   Procedure: Left Lumbar five-Sacral one Microlaminectomy and discectomy;  Surgeon: Cheryle Debby LABOR, MD;  Location: [facility];  Service: Neurosurgery;  Laterality: Left;   LUMBAR LAMINECTOMY/ DECOMPRESSION WITH MET-RX Left 04/27/2023   Procedure: Revision Left Minimally Invasive Lumbar Five-Sacral One Discectomy;  Surgeon: Cheryle Debby LABOR, MD;  Location: [facility];  Service: Neurosurgery;  Laterality: Left;  3C   Patient Active Problem List   Diagnosis Date Noted   Left ureteral stone 05/03/2023   Intractable pain 05/03/2023   Hypokalemia 05/03/2023   AKI (acute kidney injury) (HCC) 05/02/2023   Herniation of lumbar intervertebral disc with radiculopathy 04/27/2023   GERD (gastroesophageal reflux disease) 01/09/2016    Hypertension, benign 01/09/2016    PCP: Sim Emery CROME, MD  REFERRING PROVIDER: Debby Dorn MATSU, MD  REFERRING DIAG: M51.16 (ICD-10-CM) - Intervertebral disc disorders with radiculopathy, lumbar region   Rationale for Evaluation and Treatment: Rehabilitation  THERAPY DIAG:  No diagnosis found.  ONSET DATE: Chronic  SUBJECTIVE:                                                                                                                                                                                           SUBJECTIVE STATEMENT: ***  EVAL: Pt presents to PT with reports of chronic LBP with referral into bilateral LE, L>R. Also confirms N/T down to lateral L foot. Previous lumbar laminectomy and debridement with no change in status. They discussed lumbar fusion as next  step. Denies bowel/bladder changes or saddle anesthesia.   PERTINENT HISTORY:  DM II, HTN, Lumbar surgeries   PAIN:  Are you having pain?  Yes: NPRS scale: 7/10 Worst: 10/10 Pain location: low back, posterior LE L>R Pain description: sharp, N/T Aggravating factors: prolonged standing/sitting, walking Relieving factors: medication   PRECAUTIONS: None  RED FLAGS: None   WEIGHT BEARING RESTRICTIONS: No  FALLS:  Has patient fallen in last 6 months? Yes. Number of falls one - fell off deck leading to increased back pain  LIVING ENVIRONMENT: Lives with: lives with their family Lives in: House/apartment Stairs: at back deck Has following equipment at home: None  OCCUPATION: No   PLOF: Independent  PATIENT GOALS: decrease back pain, improve comfort and functional mobility   OBJECTIVE:  Note: Objective measures were completed at Evaluation unless otherwise noted.  DIAGNOSTIC FINDINGS:  See imaging   PATIENT SURVEYS:  ODI: 37/50  COGNITION: Overall cognitive status: Within functional limits for tasks assessed     SENSATION: Light touch: Impaired L LE  MUSCLE LENGTH: Thomas test:  Right (+); Left (+)  POSTURE: rounded shoulders, forward head, and increased lumbar lordosis  PALPATION: TTP to L gluteals, L lumbar paraspinals  LUMBAR ROM:   AROM eval  Flexion 25%  Extension 25%  Right lateral flexion   Left lateral flexion   Right rotation 50%  Left rotation 50%   (Blank rows = not tested)  LOWER EXTREMITY MMT:    MMT Right eval Left eval  Hip flexion 3+ 3+  Hip extension    Hip abduction 3+ 3+  Hip adduction    Hip internal rotation    Hip external rotation    Knee flexion 4 3+  Knee extension 4 3+  Ankle dorsiflexion    Ankle plantarflexion    Ankle inversion    Ankle eversion     (Blank rows = not tested)  LUMBAR SPECIAL TESTS:  Straight leg raise test: Negative and Slump test: Negative  FUNCTIONAL TESTS:  30 Second Sit to Stand: 2 reps  GAIT: Distance walked: 47ft Assistive device utilized: None Level of assistance: SBA Comments: antalgic gait L, flexed trunk  TREATMENT: [facility] Adult PT Treatment:                                                DATE: 01/24/2024 NuStep - unable due to pain  Supine PPT 2x5 - 5 hold Supine PPT with ball 2x5  LTR x 10  Hooklying clamshell x 15 GTB Standing lumbar ext at counter x 5 Manual Therapy:  Supine lumbar traction using physioball L LE distraction Grade II Modalities:  MHP to lumbar spine during supine exercises   [facility] Adult PT Treatment:                                                DATE: 01/12/2024 Therapeutic Exercise: Supine sciatic nerve glide x 5 L Supine PPT x 5 - 5 hold Hooklying clamshell x 10 GTB  PATIENT EDUCATION:  Education details: eval findings, ODI, HEP, POC Person educated: Patient Education method: Explanation, Demonstration, and Handouts Education comprehension: verbalized understanding and returned demonstration  HOME EXERCISE PROGRAM: Access Code: [email or web address] URL: [email or web address] Date: 01/11/2024 Prepared by: Alm Kingdom  Exercises -  Supine Sciatic Nerve Glide  - 1 x daily - 7 x weekly - 3 sets - 10 reps - Supine Posterior Pelvic Tilt  - 1 x daily - 7 x weekly - 2 sets - 10 reps - 5 sec hold - Hooklying Clamshell with Resistance  - 1 x daily - 7 x weekly - 3 sets - 10 reps - green band hold  ASSESSMENT:  CLINICAL IMPRESSION: ***  EVAL: Patient is a 48 y.o. F who was seen today for physical therapy evaluation and treatment for chronic LBP with referral into bilateral LE L>R. Physical findings are consistent with MD impression as pt has significant weakness and functional mobility deficits and can not sit comfortably without considerable weight shift. ODI score shows severe disability in performance of home ADLs and community activities. Pt would benefit from skilled PT, will continue to progress as able per POC.    OBJECTIVE IMPAIRMENTS: Abnormal gait, decreased activity tolerance, decreased mobility, difficulty walking, decreased ROM, decreased strength, postural dysfunction, and pain  ACTIVITY LIMITATIONS: carrying, lifting, standing, squatting, sleeping, stairs, transfers, reach over head, hygiene/grooming, and locomotion level  PARTICIPATION LIMITATIONS: meal prep, cleaning, driving, shopping, community activity, occupation, and yard work  PERSONAL FACTORS: Time since onset of injury/illness/exacerbation and 3+ comorbidities: DM II, HTN, Lumbar surgeries  are also affecting patient's functional outcome.   REHAB POTENTIAL: Fair - chronicity of condition and lumbar surgeries may affect prognosis  CLINICAL DECISION MAKING: Evolving/moderate complexity  EVALUATION COMPLEXITY: Moderate   GOALS: Goals reviewed with patient? No  SHORT TERM GOALS: Target date: 02/01/2024   Pt will be compliant and knowledgeable with initial HEP for improved comfort and carryover Baseline: initial HEP given  Goal status: INITIAL  2.  Pt will self report back and LE pain no greater than 6/10 for improved comfort and functional  ability Baseline: 10/10 at worst Goal status: INITIAL   LONG TERM GOALS: Target date: 03/08/2024  Pt will be decrease ODI disability score to no greater than 60% (30/50) as proxy for functional improvement Baseline: 74% disability (37/50) Goal status: INITIAL  2.  Pt will self report back and LE pain no greater than 3/10 for improved comfort and functional ability Baseline: 10/10 at worst Goal status: INITIAL   3.  Pt will increase 30 Second Sit to Stand rep count to no less than 5 reps for improved balance, strength, and functional mobility Baseline: 2 reps with UE Goal status: INITIAL   4.  Pt will improve bilateral hip abd/flex MMT to at least 4/5 for improving functional mobility and decreasing pain Baseline: see MMT chart Goal status: INITIAL   PLAN:  PT FREQUENCY: 1-2x/week  PT DURATION: 8 weeks  PLANNED INTERVENTIONS: 97164- PT Re-evaluation, 97110-Therapeutic exercises, 97530- Therapeutic activity, [id number]- Neuromuscular re-education, 97535- Self Care, 02859- Manual therapy, [id number]- Gait training, H9716- Electrical stimulation (unattended), [id number]- Electrical stimulation (manual), 97016- Vasopneumatic device, 20560 (1-2 muscles), 20561 (3+ muscles)- Dry Needling, Cryotherapy, and Moist heat.  PLAN FOR NEXT SESSION: assess HEP response, core/hip strengthening    Alm JAYSON Kingdom, PT 01/31/2024, 7:55 AM

## 2024-02-02 ENCOUNTER — Ambulatory Visit

## 2024-02-02 DIAGNOSIS — R2689 Other abnormalities of gait and mobility: Secondary | ICD-10-CM | POA: Diagnosis present

## 2024-02-02 DIAGNOSIS — M6281 Muscle weakness (generalized): Secondary | ICD-10-CM

## 2024-02-02 DIAGNOSIS — M5459 Other low back pain: Secondary | ICD-10-CM | POA: Diagnosis present

## 2024-02-02 NOTE — Therapy (Signed)
 OUTPATIENT PHYSICAL THERAPY TREATMENT   Patient Name: Suzanne [name] MRN: [id number] DOB:[birth year], 48 y.o., female Today's Date: 02/02/2024  END OF SESSION:  PT End of Session - 02/02/24 1526     Visit Number 3    Number of Visits 17    Date for PT Re-Evaluation 03/08/24    Authorization Type [employer] MCD    PT Start Time 1530            Past Medical History:  Diagnosis Date   Anemia    Diabetes mellitus without complication (HCC)    GERD (gastroesophageal reflux disease)    Headache    History of kidney stones    Hypertension    Sciatica    Past Surgical History:  Procedure Laterality Date   CERVICAL BIOPSY     CESAREAN SECTION N/A 05/15/2016   Procedure: CESAREAN SECTION;  Surgeon: Norleen LULLA Server, MD;  Location: [facility] BIRTHING SUITES;  Service: Obstetrics;  Laterality: N/A;   COLPOSCOPY     CYSTOSCOPY WITH RETROGRADE PYELOGRAM, URETEROSCOPY AND STENT PLACEMENT Left 05/03/2023   Procedure: CYSTOSCOPY WITH RETROGRADE PYELOGRAM, URETEROSCOPY AND STENT PLACEMENT;  Surgeon: Renda Glance, MD;  Location: [facility];  Service: Urology;  Laterality: Left;   EXTRACORPOREAL SHOCK WAVE LITHOTRIPSY Left 05/31/2023   Procedure: EXTRACORPOREAL SHOCK WAVE LITHOTRIPSY (ESWL);  Surgeon: Shona Layman BROCKS, MD;  Location: [facility]  Service: Urology;  Laterality: Left;   LUMBAR LAMINECTOMY/ DECOMPRESSION WITH MET-RX Left 10/19/2022   Procedure: Left Lumbar five-Sacral one Microlaminectomy and discectomy;  Surgeon: Cheryle Debby LABOR, MD;  Location: [facility];  Service: Neurosurgery;  Laterality: Left;   LUMBAR LAMINECTOMY/ DECOMPRESSION WITH MET-RX Left 04/27/2023   Procedure: Revision Left Minimally Invasive Lumbar Five-Sacral One Discectomy;  Surgeon: Cheryle Debby LABOR, MD;  Location: [facility];  Service: Neurosurgery;  Laterality: Left;  3C   Patient Active Problem List   Diagnosis Date Noted   Left ureteral stone 05/03/2023   Intractable pain 05/03/2023   Hypokalemia 05/03/2023    AKI (acute kidney injury) (HCC) 05/02/2023   Herniation of lumbar intervertebral disc with radiculopathy 04/27/2023   GERD (gastroesophageal reflux disease) 01/09/2016   Hypertension, benign 01/09/2016    PCP: Sim Emery CROME, MD  REFERRING PROVIDER: Debby Dorn MATSU, MD  REFERRING DIAG: M51.16 (ICD-10-CM) - Intervertebral disc disorders with radiculopathy, lumbar region   Rationale for Evaluation and Treatment: Rehabilitation  THERAPY DIAG:  Other low back pain  Muscle weakness (generalized)  Other abnormalities of gait and mobility  ONSET DATE: Chronic  SUBJECTIVE:  SUBJECTIVE STATEMENT: Pt presents to PT with reports of 6/10 pain. Felt a lot of pain over the weekend.   EVAL: Pt presents to PT with reports of chronic LBP with referral into bilateral LE, L>R. Also confirms N/T down to lateral L foot. Previous lumbar laminectomy and debridement with no change in status. They discussed lumbar fusion as next step. Denies bowel/bladder changes or saddle anesthesia.   PERTINENT HISTORY:  DM II, HTN, Lumbar surgeries   PAIN:  Are you having pain?  Yes: NPRS scale: 7/10 Worst: 10/10 Pain location: low back, posterior LE L>R Pain description: sharp, N/T Aggravating factors: prolonged standing/sitting, walking Relieving factors: medication   PRECAUTIONS: None  RED FLAGS: None   WEIGHT BEARING RESTRICTIONS: No  FALLS:  Has patient fallen in last 6 months? Yes. Number of falls one - fell off deck leading to increased back pain  LIVING ENVIRONMENT: Lives with: lives with their family Lives in: House/apartment Stairs: at back deck Has following equipment at home: None  OCCUPATION: No   PLOF: Independent  PATIENT GOALS: decrease back pain, improve comfort and functional mobility    OBJECTIVE:  Note: Objective measures were completed at Evaluation unless otherwise noted.  DIAGNOSTIC FINDINGS:  See imaging   PATIENT SURVEYS:  ODI: 37/50  COGNITION: Overall cognitive status: Within functional limits for tasks assessed     SENSATION: Light touch: Impaired L LE  MUSCLE LENGTH: Thomas test: Right (+); Left (+)  POSTURE: rounded shoulders, forward head, and increased lumbar lordosis  PALPATION: TTP to L gluteals, L lumbar paraspinals  LUMBAR ROM:   AROM eval  Flexion 25%  Extension 25%  Right lateral flexion   Left lateral flexion   Right rotation 50%  Left rotation 50%   (Blank rows = not tested)  LOWER EXTREMITY MMT:    MMT Right eval Left eval  Hip flexion 3+ 3+  Hip extension    Hip abduction 3+ 3+  Hip adduction    Hip internal rotation    Hip external rotation    Knee flexion 4 3+  Knee extension 4 3+  Ankle dorsiflexion    Ankle plantarflexion    Ankle inversion    Ankle eversion     (Blank rows = not tested)  LUMBAR SPECIAL TESTS:  Straight leg raise test: Negative and Slump test: Negative  FUNCTIONAL TESTS:  30 Second Sit to Stand: 2 reps  GAIT: Distance walked: 24ft Assistive device utilized: None Level of assistance: SBA Comments: antalgic gait L, flexed trunk  TREATMENT: [facility] Adult PT Treatment:                                                DATE: 02/02/2024 NuStep UE/LE lvl 3 x 3 for functional activity tolerance Supine PPT x 10 - 5 hold Supine PPT with ball 2x10 LTR x 10  Hooklying clamshell 2x10 GTB Bridge (small range) 2x10 Supine fig 4 push/pull 2x30 L Seated fig 4 stretch 2x30 L Standing hip ext 2x5 ea Manual Therapy:  Supine lumbar traction using physioball Manual stretching to bilateral hamstrings with sciatic nerve glide 2x10 each Modalities:  MHP to lumbar spine during supine exercises   PATIENT EDUCATION:  Education details: eval findings, ODI, HEP, POC Person educated: Patient Education  method: Explanation, Demonstration, and Handouts Education comprehension: verbalized understanding and returned demonstration  HOME EXERCISE PROGRAM: Access Code: [email or web address]  URL: [email or web address] Date: 02/02/2024 Prepared by: Alm Kingdom  Exercises - Supine Sciatic Nerve Glide  - 1 x daily - 7 x weekly - 3 sets - 10 reps - Supine Posterior Pelvic Tilt  - 1 x daily - 7 x weekly - 2 sets - 10 reps - 5 sec hold - Hooklying Clamshell with Resistance  - 1 x daily - 7 x weekly - 3 sets - 10 reps - green band hold - Beginner Bridge  - 1 x daily - 7 x weekly - 2 sets - 10 reps - Seated Figure 4 Ankle Inversion with Resistance  - 1 x daily - 7 x weekly - 2 reps - 30 sec hold  ASSESSMENT:  CLINICAL IMPRESSION: ***  EVAL: Patient is a 48 y.o. F who was seen today for physical therapy evaluation and treatment for chronic LBP with referral into bilateral LE L>R. Physical findings are consistent with MD impression as pt has significant weakness and functional mobility deficits and can not sit comfortably without considerable weight shift. ODI score shows severe disability in performance of home ADLs and community activities. Pt would benefit from skilled PT, will continue to progress as able per POC.    OBJECTIVE IMPAIRMENTS: Abnormal gait, decreased activity tolerance, decreased mobility, difficulty walking, decreased ROM, decreased strength, postural dysfunction, and pain  ACTIVITY LIMITATIONS: carrying, lifting, standing, squatting, sleeping, stairs, transfers, reach over head, hygiene/grooming, and locomotion level  PARTICIPATION LIMITATIONS: meal prep, cleaning, driving, shopping, community activity, occupation, and yard work  PERSONAL FACTORS: Time since onset of injury/illness/exacerbation and 3+ comorbidities: DM II, HTN, Lumbar surgeries  are also affecting patient's functional outcome.   REHAB POTENTIAL: Fair - chronicity of condition and lumbar surgeries may affect  prognosis  CLINICAL DECISION MAKING: Evolving/moderate complexity  EVALUATION COMPLEXITY: Moderate   GOALS: Goals reviewed with patient? No  SHORT TERM GOALS: Target date: 02/01/2024   Pt will be compliant and knowledgeable with initial HEP for improved comfort and carryover Baseline: initial HEP given  Goal status: INITIAL  2.  Pt will self report back and LE pain no greater than 6/10 for improved comfort and functional ability Baseline: 10/10 at worst Goal status: INITIAL   LONG TERM GOALS: Target date: 03/08/2024  Pt will be decrease ODI disability score to no greater than 60% (30/50) as proxy for functional improvement Baseline: 74% disability (37/50) Goal status: INITIAL  2.  Pt will self report back and LE pain no greater than 3/10 for improved comfort and functional ability Baseline: 10/10 at worst Goal status: INITIAL   3.  Pt will increase 30 Second Sit to Stand rep count to no less than 5 reps for improved balance, strength, and functional mobility Baseline: 2 reps with UE Goal status: INITIAL   4.  Pt will improve bilateral hip abd/flex MMT to at least 4/5 for improving functional mobility and decreasing pain Baseline: see MMT chart Goal status: INITIAL   PLAN:  PT FREQUENCY: 1-2x/week  PT DURATION: 8 weeks  PLANNED INTERVENTIONS: 97164- PT Re-evaluation, 97110-Therapeutic exercises, 97530- Therapeutic activity, [id number]- Neuromuscular re-education, 97535- Self Care, [id number]- Manual therapy, [id number]- Gait training, H9716- Electrical stimulation (unattended), [id number]- Electrical stimulation (manual), 97016- Vasopneumatic device, 20560 (1-2 muscles), 20561 (3+ muscles)- Dry Needling, Cryotherapy, and Moist heat.  PLAN FOR NEXT SESSION: assess HEP response, core/hip strengthening    Alm JAYSON Kingdom, PT 02/02/2024, 3:27 PM

## 2024-02-07 ENCOUNTER — Ambulatory Visit

## 2024-02-07 NOTE — Therapy (Incomplete)
 OUTPATIENT PHYSICAL THERAPY TREATMENT   Patient Name: Suzanne [name] MRN: [id number] DOB:[birth year], 48 y.o., female Today's Date: 02/07/2024  END OF SESSION:      Past Medical History:  Diagnosis Date   Anemia    Diabetes mellitus without complication (HCC)    GERD (gastroesophageal reflux disease)    Headache    History of kidney stones    Hypertension    Sciatica    Past Surgical History:  Procedure Laterality Date   CERVICAL BIOPSY     CESAREAN SECTION N/A 05/15/2016   Procedure: CESAREAN SECTION;  Surgeon: Norleen LULLA Server, MD;  Location: [facility] BIRTHING SUITES;  Service: Obstetrics;  Laterality: N/A;   COLPOSCOPY     CYSTOSCOPY WITH RETROGRADE PYELOGRAM, URETEROSCOPY AND STENT PLACEMENT Left 05/03/2023   Procedure: CYSTOSCOPY WITH RETROGRADE PYELOGRAM, URETEROSCOPY AND STENT PLACEMENT;  Surgeon: Renda Glance, MD;  Location: [facility];  Service: Urology;  Laterality: Left;   EXTRACORPOREAL SHOCK WAVE LITHOTRIPSY Left 05/31/2023   Procedure: EXTRACORPOREAL SHOCK WAVE LITHOTRIPSY (ESWL);  Surgeon: Shona Layman BROCKS, MD;  Location: [facility];  Service: Urology;  Laterality: Left;   LUMBAR LAMINECTOMY/ DECOMPRESSION WITH MET-RX Left 10/19/2022   Procedure: Left Lumbar five-Sacral one Microlaminectomy and discectomy;  Surgeon: Cheryle Debby LABOR, MD;  Location: [facility];  Service: Neurosurgery;  Laterality: Left;   LUMBAR LAMINECTOMY/ DECOMPRESSION WITH MET-RX Left 04/27/2023   Procedure: Revision Left Minimally Invasive Lumbar Five-Sacral One Discectomy;  Surgeon: Cheryle Debby LABOR, MD;  Location: [facility];  Service: Neurosurgery;  Laterality: Left;  3C   Patient Active Problem List   Diagnosis Date Noted   Left ureteral stone 05/03/2023   Intractable pain 05/03/2023   Hypokalemia 05/03/2023   AKI (acute kidney injury) (HCC) 05/02/2023   Herniation of lumbar intervertebral disc with radiculopathy 04/27/2023   GERD (gastroesophageal reflux disease) 01/09/2016    Hypertension, benign 01/09/2016    PCP: Sim Emery CROME, MD  REFERRING PROVIDER: Debby Dorn MATSU, MD  REFERRING DIAG: M51.16 (ICD-10-CM) - Intervertebral disc disorders with radiculopathy, lumbar region   Rationale for Evaluation and Treatment: Rehabilitation  THERAPY DIAG:  No diagnosis found.  ONSET DATE: Chronic  SUBJECTIVE:                                                                                                                                                                                           SUBJECTIVE STATEMENT: Pt presents to PT with reports of 6/10 pain. Felt a lot of pain over the weekend.   EVAL: Pt presents to PT with reports of chronic LBP with referral into bilateral LE, L>R. Also confirms N/T down to lateral  L foot. Previous lumbar laminectomy and debridement with no change in status. They discussed lumbar fusion as next step. Denies bowel/bladder changes or saddle anesthesia.   PERTINENT HISTORY:  DM II, HTN, Lumbar surgeries   PAIN:  Are you having pain?  Yes: NPRS scale: 7/10 Worst: 10/10 Pain location: low back, posterior LE L>R Pain description: sharp, N/T Aggravating factors: prolonged standing/sitting, walking Relieving factors: medication   PRECAUTIONS: None  RED FLAGS: None   WEIGHT BEARING RESTRICTIONS: No  FALLS:  Has patient fallen in last 6 months? Yes. Number of falls one - fell off deck leading to increased back pain  LIVING ENVIRONMENT: Lives with: lives with their family Lives in: House/apartment Stairs: at back deck Has following equipment at home: None  OCCUPATION: No   PLOF: Independent  PATIENT GOALS: decrease back pain, improve comfort and functional mobility   OBJECTIVE:  Note: Objective measures were completed at Evaluation unless otherwise noted.  DIAGNOSTIC FINDINGS:  See imaging   PATIENT SURVEYS:  ODI: 37/50  COGNITION: Overall cognitive status: Within functional limits for tasks  assessed     SENSATION: Light touch: Impaired L LE  MUSCLE LENGTH: Thomas test: Right (+); Left (+)  POSTURE: rounded shoulders, forward head, and increased lumbar lordosis  PALPATION: TTP to L gluteals, L lumbar paraspinals  LUMBAR ROM:   AROM eval  Flexion 25%  Extension 25%  Right lateral flexion   Left lateral flexion   Right rotation 50%  Left rotation 50%   (Blank rows = not tested)  LOWER EXTREMITY MMT:    MMT Right eval Left eval  Hip flexion 3+ 3+  Hip extension    Hip abduction 3+ 3+  Hip adduction    Hip internal rotation    Hip external rotation    Knee flexion 4 3+  Knee extension 4 3+  Ankle dorsiflexion    Ankle plantarflexion    Ankle inversion    Ankle eversion     (Blank rows = not tested)  LUMBAR SPECIAL TESTS:  Straight leg raise test: Negative and Slump test: Negative  FUNCTIONAL TESTS:  30 Second Sit to Stand: 2 reps  GAIT: Distance walked: 38ft Assistive device utilized: None Level of assistance: SBA Comments: antalgic gait L, flexed trunk  TREATMENT: [facility] Adult PT Treatment:                                                DATE: 02/02/2024 NuStep UE/LE lvl 3 x 3 for functional activity tolerance Supine PPT x 10 - 5 hold Supine PPT with ball 2x10 LTR x 10  Hooklying clamshell 2x10 GTB Bridge (small range) 2x10 Supine fig 4 push/pull 2x30 L Seated fig 4 stretch 2x30 L Standing hip ext 2x5 ea Manual Therapy:  Supine lumbar traction using physioball Manual stretching to bilateral hamstrings with sciatic nerve glide 2x10 each Modalities:  MHP to lumbar spine during supine exercises   PATIENT EDUCATION:  Education details: eval findings, ODI, HEP, POC Person educated: Patient Education method: Explanation, Demonstration, and Handouts Education comprehension: verbalized understanding and returned demonstration  HOME EXERCISE PROGRAM: Access Code: [email or web address] URL: [email or web address] Date:  02/03/2024 Prepared by: Alm Kingdom  Exercises - Supine Sciatic Nerve Glide  - 1 x daily - 7 x weekly - 3 sets - 10 reps - Supine Posterior Pelvic Tilt  - 1 x daily - 7 x  weekly - 2 sets - 10 reps - 5 sec hold - Hooklying Clamshell with Resistance  - 1 x daily - 7 x weekly - 3 sets - 10 reps - green band hold - Beginner Bridge  - 1 x daily - 7 x weekly - 2 sets - 10 reps - Seated Figure 4 Piriformis Stretch  - 1 x daily - 7 x weekly - 2 sets - 30 sec hold  ASSESSMENT:  CLINICAL IMPRESSION: Pt was able to complete prescribed exercises, did have continued pain during session in posterior L hip. We updated HEP with focus on extension and posterior hip stretching. PT will continue, if symptoms continue therapeutic benefit is questionable.   EVAL: Patient is a 48 y.o. F who was seen today for physical therapy evaluation and treatment for chronic LBP with referral into bilateral LE L>R. Physical findings are consistent with MD impression as pt has significant weakness and functional mobility deficits and can not sit comfortably without considerable weight shift. ODI score shows severe disability in performance of home ADLs and community activities. Pt would benefit from skilled PT, will continue to progress as able per POC.    OBJECTIVE IMPAIRMENTS: Abnormal gait, decreased activity tolerance, decreased mobility, difficulty walking, decreased ROM, decreased strength, postural dysfunction, and pain  ACTIVITY LIMITATIONS: carrying, lifting, standing, squatting, sleeping, stairs, transfers, reach over head, hygiene/grooming, and locomotion level  PARTICIPATION LIMITATIONS: meal prep, cleaning, driving, shopping, community activity, occupation, and yard work  PERSONAL FACTORS: Time since onset of injury/illness/exacerbation and 3+ comorbidities: DM II, HTN, Lumbar surgeries  are also affecting patient's functional outcome.   REHAB POTENTIAL: Fair - chronicity of condition and lumbar surgeries may  affect prognosis  CLINICAL DECISION MAKING: Evolving/moderate complexity  EVALUATION COMPLEXITY: Moderate   GOALS: Goals reviewed with patient? No  SHORT TERM GOALS: Target date: 02/01/2024   Pt will be compliant and knowledgeable with initial HEP for improved comfort and carryover Baseline: initial HEP given  Goal status: INITIAL  2.  Pt will self report back and LE pain no greater than 6/10 for improved comfort and functional ability Baseline: 10/10 at worst Goal status: INITIAL   LONG TERM GOALS: Target date: 03/08/2024  Pt will be decrease ODI disability score to no greater than 60% (30/50) as proxy for functional improvement Baseline: 74% disability (37/50) Goal status: INITIAL  2.  Pt will self report back and LE pain no greater than 3/10 for improved comfort and functional ability Baseline: 10/10 at worst Goal status: INITIAL   3.  Pt will increase 30 Second Sit to Stand rep count to no less than 5 reps for improved balance, strength, and functional mobility Baseline: 2 reps with UE Goal status: INITIAL   4.  Pt will improve bilateral hip abd/flex MMT to at least 4/5 for improving functional mobility and decreasing pain Baseline: see MMT chart Goal status: INITIAL   PLAN:  PT FREQUENCY: 1-2x/week  PT DURATION: 8 weeks  PLANNED INTERVENTIONS: 97164- PT Re-evaluation, 97110-Therapeutic exercises, 97530- Therapeutic activity, [id number]- Neuromuscular re-education, 97535- Self Care, 02859- Manual therapy, [id number]- Gait training, H9716- Electrical stimulation (unattended), [id number]- Electrical stimulation (manual), 97016- Vasopneumatic device, 20560 (1-2 muscles), 20561 (3+ muscles)- Dry Needling, Cryotherapy, and Moist heat.  PLAN FOR NEXT SESSION: assess HEP response, core/hip strengthening    Alm JAYSON Kingdom, PT 02/07/2024, 8:15 AM

## 2024-02-09 ENCOUNTER — Ambulatory Visit

## 2024-02-09 NOTE — Therapy (Incomplete)
 OUTPATIENT PHYSICAL THERAPY TREATMENT   Patient Name: Suzanne [name] MRN: [id number] DOB:March 29, 1976, 48 y.o., female Today's Date: 02/09/2024  END OF SESSION:      Past Medical History:  Diagnosis Date   Anemia    Diabetes mellitus without complication (HCC)    GERD (gastroesophageal reflux disease)    Headache    History of kidney stones    Hypertension    Sciatica    Past Surgical History:  Procedure Laterality Date   CERVICAL BIOPSY     CESAREAN SECTION N/A 05/15/2016   Procedure: CESAREAN SECTION;  Surgeon: Norleen LULLA Server, MD;  Location: [facility]IRTHING SU[facility];  Service: Obstetrics;  Laterality: N/A;   COLPOSCOPY     CYSTOSCOPY WITH RETROGRADE PYELOGRAM, URETEROSCOPY AND STENT PLACEMENT Left 05/03/2023   Procedure: CYSTOSCOPY WITH RETROGRADE PYELOGRAM, URETEROSCOPY AND STENT PLACEMENT;  Surgeon: Renda Glance, MD;  Location: [facility];  Service: Urology;  Laterality: Left;   EXTRACORPOREAL SHOCK WAVE LITHOTRIPSY Left 05/31/2023   Procedure: EXTRACORPOREAL SHOCK WAVE LITHOTRIPSY (ESWL);  Surgeon: Shona Layman BROCKS, MD;  Location: [facility];  Service: Urology;  Laterality: Left;   LUMBAR LAMINECTOMY/ DECOMPRESSION WITH MET-RX Left 10/19/2022   Procedure: Left Lumbar five-Sacral one Microlaminectomy and discectomy;  Surgeon: Cheryle Debby LABOR, MD;  Location: [facility];  Service: Neurosurgery;  Laterality: Left;   LUMBAR LAMINECTOMY/ DECOMPRESSION WITH MET-RX Left 04/27/2023   Procedure: Revision Left Minimally Invasive Lumbar Five-Sacral One Discectomy;  Surgeon: Cheryle Debby LABOR, MD;  Location: [facility];  Service: Neurosurgery;  Laterality: Left;  3C   Patient Active Problem List   Diagnosis Date Noted   Left ureteral stone 05/03/2023   Intractable pain 05/03/2023   Hypokalemia 05/03/2023   AKI (acute kidney injury) (HCC) 05/02/2023   Herniation of lumbar intervertebral disc with radiculopathy 04/27/2023   GERD (gastroesophageal reflux disease) 01/09/2016    Hypertension, benign 01/09/2016    PCP: Sim Emery CROME, MD  REFERRING PROVIDER: Debby Dorn MATSU, MD  REFERRING DIAG: M51.16 (ICD-10-CM) - Intervertebral disc disorders with radiculopathy, lumbar region   Rationale for Evaluation and Treatment: Rehabilitation  THERAPY DIAG:  No diagnosis found.  ONSET DATE: Chronic  SUBJECTIVE:                                                                                                                                                                                           SUBJECTIVE STATEMENT: ***  EVAL: Pt presents to PT with reports of chronic LBP with referral into bilateral LE, L>R. Also confirms N/T down to lateral L foot. Previous lumbar laminectomy and debridement with no change in status. They discussed lumbar fusion as  next step. Denies bowel/bladder changes or saddle anesthesia.   PERTINENT HISTORY:  DM II, HTN, Lumbar surgeries   PAIN:  Are you having pain?  Yes: NPRS scale: 7/10 Worst: 10/10 Pain location: low back, posterior LE L>R Pain description: sharp, N/T Aggravating factors: prolonged standing/sitting, walking Relieving factors: medication   PRECAUTIONS: None  RED FLAGS: None   WEIGHT BEARING RESTRICTIONS: No  FALLS:  Has patient fallen in last 6 months? Yes. Number of falls one - fell off deck leading to increased back pain  LIVING ENVIRONMENT: Lives with: lives with their family Lives in: House/apartment Stairs: at back deck Has following equipment at home: None  OCCUPATION: No   PLOF: Independent  PATIENT GOALS: decrease back pain, improve comfort and functional mobility   OBJECTIVE:  Note: Objective measures were completed at Evaluation unless otherwise noted.  DIAGNOSTIC FINDINGS:  See imaging   PATIENT SURVEYS:  ODI: 37/50  COGNITION: Overall cognitive status: Within functional limits for tasks assessed     SENSATION: Light touch: Impaired L LE  MUSCLE LENGTH: Thomas test:  Right (+); Left (+)  POSTURE: rounded shoulders, forward head, and increased lumbar lordosis  PALPATION: TTP to L gluteals, L lumbar paraspinals  LUMBAR ROM:   AROM eval  Flexion 25%  Extension 25%  Right lateral flexion   Left lateral flexion   Right rotation 50%  Left rotation 50%   (Blank rows = not tested)  LOWER EXTREMITY MMT:    MMT Right eval Left eval  Hip flexion 3+ 3+  Hip extension    Hip abduction 3+ 3+  Hip adduction    Hip internal rotation    Hip external rotation    Knee flexion 4 3+  Knee extension 4 3+  Ankle dorsiflexion    Ankle plantarflexion    Ankle inversion    Ankle eversion     (Blank rows = not tested)  LUMBAR SPECIAL TESTS:  Straight leg raise test: Negative and Slump test: Negative  FUNCTIONAL TESTS:  30 Second Sit to Stand: 2 reps  GAIT: Distance walked: 39ft Assistive device utilized: None Level of assistance: SBA Comments: antalgic gait L, flexed trunk  TREATMENT: [facility] Adult PT Treatment:                                                DATE: 02/09/2024 NuStep UE/LE lvl 3 x 3 for functional activity tolerance Supine PPT x 10 - 5 hold Supine PPT with ball 2x10 LTR x 10  Hooklying clamshell 2x10 GTB Bridge (small range) 2x10 Supine fig 4 push/pull 2x30 L Seated fig 4 stretch 2x30 L Standing hip ext 2x5 ea Manual Therapy:  Supine lumbar traction using physioball Manual stretching to bilateral hamstrings with sciatic nerve glide 2x10 each Modalities:  MHP to lumbar spine during supine exercises   PATIENT EDUCATION:  Education details: eval findings, ODI, HEP, POC Person educated: Patient Education method: Explanation, Demonstration, and Handouts Education comprehension: verbalized understanding and returned demonstration  HOME EXERCISE PROGRAM: Access Code: [email or web address] URL: [email or web address] Date: 02/03/2024 Prepared by: Alm Kingdom  Exercises - Supine Sciatic Nerve Glide  - 1 x daily - 7 x  weekly - 3 sets - 10 reps - Supine Posterior Pelvic Tilt  - 1 x daily - 7 x weekly - 2 sets - 10 reps - 5 sec hold - Hooklying Clamshell with Resistance  -  1 x daily - 7 x weekly - 3 sets - 10 reps - green band hold - Beginner Bridge  - 1 x daily - 7 x weekly - 2 sets - 10 reps - Seated Figure 4 Piriformis Stretch  - 1 x daily - 7 x weekly - 2 sets - 30 sec hold  ASSESSMENT:  CLINICAL IMPRESSION: ***  EVAL: Patient is a 48 y.o. F who was seen today for physical therapy evaluation and treatment for chronic LBP with referral into bilateral LE L>R. Physical findings are consistent with MD impression as pt has significant weakness and functional mobility deficits and can not sit comfortably without considerable weight shift. ODI score shows severe disability in performance of home ADLs and community activities. Pt would benefit from skilled PT, will continue to progress as able per POC.    OBJECTIVE IMPAIRMENTS: Abnormal gait, decreased activity tolerance, decreased mobility, difficulty walking, decreased ROM, decreased strength, postural dysfunction, and pain  ACTIVITY LIMITATIONS: carrying, lifting, standing, squatting, sleeping, stairs, transfers, reach over head, hygiene/grooming, and locomotion level  PARTICIPATION LIMITATIONS: meal prep, cleaning, driving, shopping, community activity, occupation, and yard work  PERSONAL FACTORS: Time since onset of injury/illness/exacerbation and 3+ comorbidities: DM II, HTN, Lumbar surgeries  are also affecting patient's functional outcome.   REHAB POTENTIAL: Fair - chronicity of condition and lumbar surgeries may affect prognosis  CLINICAL DECISION MAKING: Evolving/moderate complexity  EVALUATION COMPLEXITY: Moderate   GOALS: Goals reviewed with patient? No  SHORT TERM GOALS: Target date: 02/01/2024   Pt will be compliant and knowledgeable with initial HEP for improved comfort and carryover Baseline: initial HEP given  Goal status: INITIAL  2.   Pt will self report back and LE pain no greater than 6/10 for improved comfort and functional ability Baseline: 10/10 at worst Goal status: INITIAL   LONG TERM GOALS: Target date: 03/08/2024  Pt will be decrease ODI disability score to no greater than 60% (30/50) as proxy for functional improvement Baseline: 74% disability (37/50) Goal status: INITIAL  2.  Pt will self report back and LE pain no greater than 3/10 for improved comfort and functional ability Baseline: 10/10 at worst Goal status: INITIAL   3.  Pt will increase 30 Second Sit to Stand rep count to no less than 5 reps for improved balance, strength, and functional mobility Baseline: 2 reps with UE Goal status: INITIAL   4.  Pt will improve bilateral hip abd/flex MMT to at least 4/5 for improving functional mobility and decreasing pain Baseline: see MMT chart Goal status: INITIAL   PLAN:  PT FREQUENCY: 1-2x/week  PT DURATION: 8 weeks  PLANNED INTERVENTIONS: 97164- PT Re-evaluation, 97110-Therapeutic exercises, 97530- Therapeutic activity, [id number]- Neuromuscular re-education, 97535- Self Care, [id number]- Manual therapy, [id number]- Gait training, H9716- Electrical stimulation (unattended), [id number]- Electrical stimulation (manual), 97016- Vasopneumatic device, 20560 (1-2 muscles), 20561 (3+ muscles)- Dry Needling, Cryotherapy, and Moist heat.  PLAN FOR NEXT SESSION: assess HEP response, core/hip strengthening    Alm JAYSON Kingdom, PT 02/09/2024, 7:58 AM

## 2024-02-10 ENCOUNTER — Ambulatory Visit: Admitting: Physical Therapy

## 2024-02-10 ENCOUNTER — Encounter

## 2024-02-10 DIAGNOSIS — M5459 Other low back pain: Secondary | ICD-10-CM

## 2024-02-10 DIAGNOSIS — R2689 Other abnormalities of gait and mobility: Secondary | ICD-10-CM

## 2024-02-10 DIAGNOSIS — M6281 Muscle weakness (generalized): Secondary | ICD-10-CM

## 2024-02-10 NOTE — Therapy (Incomplete)
 OUTPATIENT PHYSICAL THERAPY TREATMENT   Patient Name: Suzanne [name] MRN: [id number] DOB:11-19-1975, 48 y.o., female Today's Date: 02/10/2024  END OF SESSION:      Past Medical History:  Diagnosis Date   Anemia    Diabetes mellitus without complication (HCC)    GERD (gastroesophageal reflux disease)    Headache    History of kidney stones    Hypertension    Sciatica    Past Surgical History:  Procedure Laterality Date   CERVICAL BIOPSY     CESAREAN SECTION N/A 05/15/2016   Procedure: CESAREAN SECTION;  Surgeon: Norleen LULLA Server, MD;  Location: [facility];  Service: Obstetrics;  Laterality: N/A;   COLPOSCOPY     CYSTOSCOPY WITH RETROGRADE PYELOGRAM, URETEROSCOPY AND STENT PLACEMENT Left 05/03/2023   Procedure: CYSTOSCOPY WITH RETROGRADE PYELOGRAM, URETEROSCOPY AND STENT PLACEMENT;  Surgeon: Renda Glance, MD;  Location: [facility];  Service: Urology;  Laterality: Left;   EXTRACORPOREAL SHOCK WAVE LITHOTRIPSY Left 05/31/2023   Procedure: EXTRACORPOREAL SHOCK WAVE LITHOTRIPSY (ESWL);  Surgeon: Shona Layman BROCKS, MD;  Location: [facility];  Service: Urology;  Laterality: Left;   LUMBAR LAMINECTOMY/ DECOMPRESSION WITH MET-RX Left 10/19/2022   Procedure: Left Lumbar five-Sacral one Microlaminectomy and discectomy;  Surgeon: Cheryle Debby LABOR, MD;  Location: [facility];  Service: Neurosurgery;  Laterality: Left;   LUMBAR LAMINECTOMY/ DECOMPRESSION WITH MET-RX Left 04/27/2023   Procedure: Revision Left Minimally Invasive Lumbar Five-Sacral One Discectomy;  Surgeon: Cheryle Debby LABOR, MD;  Location: [facility];  Service: Neurosurgery;  Laterality: Left;  3C   Patient Active Problem List   Diagnosis Date Noted   Left ureteral stone 05/03/2023   Intractable pain 05/03/2023   Hypokalemia 05/03/2023   AKI (acute kidney injury) (HCC) 05/02/2023   Herniation of lumbar intervertebral disc with radiculopathy 04/27/2023   GERD (gastroesophageal reflux disease) 01/09/2016    Hypertension, benign 01/09/2016    PCP: Sim Emery CROME, MD  REFERRING PROVIDER: Debby Dorn MATSU, MD  REFERRING DIAG: M51.16 (ICD-10-CM) - Intervertebral disc disorders with radiculopathy, lumbar region   Rationale for Evaluation and Treatment: Rehabilitation  THERAPY DIAG:  No diagnosis found.  ONSET DATE: Chronic  SUBJECTIVE:                                                                                                                                                                                           SUBJECTIVE STATEMENT: ***  EVAL: Pt presents to PT with reports of chronic LBP with referral into bilateral LE, L>R. Also confirms N/T down to lateral L foot. Previous lumbar laminectomy and debridement with no change in status. They discussed lumbar fusion as  next step. Denies bowel/bladder changes or saddle anesthesia.   PERTINENT HISTORY:  DM II, HTN, Lumbar surgeries   PAIN:  Are you having pain?  Yes: NPRS scale: 7/10 Worst: 10/10 Pain location: low back, posterior LE L>R Pain description: sharp, N/T Aggravating factors: prolonged standing/sitting, walking Relieving factors: medication   PRECAUTIONS: None  RED FLAGS: None   WEIGHT BEARING RESTRICTIONS: No  FALLS:  Has patient fallen in last 6 months? Yes. Number of falls one - fell off deck leading to increased back pain  LIVING ENVIRONMENT: Lives with: lives with their family Lives in: House/apartment Stairs: at back deck Has following equipment at home: None  OCCUPATION: No   PLOF: Independent  PATIENT GOALS: decrease back pain, improve comfort and functional mobility   OBJECTIVE:  Note: Objective measures were completed at Evaluation unless otherwise noted.  DIAGNOSTIC FINDINGS:  See imaging   PATIENT SURVEYS:  ODI: 37/50  COGNITION: Overall cognitive status: Within functional limits for tasks assessed     SENSATION: Light touch: Impaired L LE  MUSCLE LENGTH: Thomas test:  Right (+); Left (+)  POSTURE: rounded shoulders, forward head, and increased lumbar lordosis  PALPATION: TTP to L gluteals, L lumbar paraspinals  LUMBAR ROM:   AROM eval  Flexion 25%  Extension 25%  Right lateral flexion   Left lateral flexion   Right rotation 50%  Left rotation 50%   (Blank rows = not tested)  LOWER EXTREMITY MMT:    MMT Right eval Left eval  Hip flexion 3+ 3+  Hip extension    Hip abduction 3+ 3+  Hip adduction    Hip internal rotation    Hip external rotation    Knee flexion 4 3+  Knee extension 4 3+  Ankle dorsiflexion    Ankle plantarflexion    Ankle inversion    Ankle eversion     (Blank rows = not tested)  LUMBAR SPECIAL TESTS:  Straight leg raise test: Negative and Slump test: Negative  FUNCTIONAL TESTS:  30 Second Sit to Stand: 2 reps  GAIT: Distance walked: 67ft Assistive device utilized: None Level of assistance: SBA Comments: antalgic gait L, flexed trunk  TREATMENT: [facility] Adult PT Treatment:                                                DATE: 02/10/2024 NuStep UE/LE lvl 3 x 3 for functional activity tolerance Supine PPT x 10 - 5 hold Supine PPT with ball 2x10 LTR x 10  Hooklying clamshell 2x10 GTB Bridge (small range) 2x10 Supine fig 4 push/pull 2x30 L Seated fig 4 stretch 2x30 L Standing hip ext 2x5 ea Manual Therapy:  Supine lumbar traction using physioball Manual stretching to bilateral hamstrings with sciatic nerve glide 2x10 each Modalities:  MHP to lumbar spine during supine exercises   [facility] Adult PT Treatment:                                                DATE: 02/02/2024 NuStep UE/LE lvl 3 x 3 for functional activity tolerance Supine PPT x 10 - 5 hold Supine PPT with ball 2x10 LTR x 10  Hooklying clamshell 2x10 GTB Bridge (small range) 2x10 Supine fig 4 push/pull 2x30 L Seated  fig 4 stretch 2x30 L Standing hip ext 2x5 ea Manual Therapy:  Supine lumbar traction using physioball Manual stretching to  bilateral hamstrings with sciatic nerve glide 2x10 each Modalities:  MHP to lumbar spine during supine exercises   PATIENT EDUCATION:  Education details: eval findings, ODI, HEP, POC Person educated: Patient Education method: Explanation, Demonstration, and Handouts Education comprehension: verbalized understanding and returned demonstration  HOME EXERCISE PROGRAM: Access Code: [email or web address] URL: [email or web address] Date: 02/03/2024 Prepared by: Alm Kingdom  Exercises - Supine Sciatic Nerve Glide  - 1 x daily - 7 x weekly - 3 sets - 10 reps - Supine Posterior Pelvic Tilt  - 1 x daily - 7 x weekly - 2 sets - 10 reps - 5 sec hold - Hooklying Clamshell with Resistance  - 1 x daily - 7 x weekly - 3 sets - 10 reps - green band hold - Beginner Bridge  - 1 x daily - 7 x weekly - 2 sets - 10 reps - Seated Figure 4 Piriformis Stretch  - 1 x daily - 7 x weekly - 2 sets - 30 sec hold  ASSESSMENT:  CLINICAL IMPRESSION: ***  EVAL: Patient is a 48 y.o. F who was seen today for physical therapy evaluation and treatment for chronic LBP with referral into bilateral LE L>R. Physical findings are consistent with MD impression as pt has significant weakness and functional mobility deficits and can not sit comfortably without considerable weight shift. ODI score shows severe disability in performance of home ADLs and community activities. Pt would benefit from skilled PT, will continue to progress as able per POC.    OBJECTIVE IMPAIRMENTS: Abnormal gait, decreased activity tolerance, decreased mobility, difficulty walking, decreased ROM, decreased strength, postural dysfunction, and pain  ACTIVITY LIMITATIONS: carrying, lifting, standing, squatting, sleeping, stairs, transfers, reach over head, hygiene/grooming, and locomotion level  PARTICIPATION LIMITATIONS: meal prep, cleaning, driving, shopping, community activity, occupation, and yard work  PERSONAL FACTORS: Time since onset of  injury/illness/exacerbation and 3+ comorbidities: DM II, HTN, Lumbar surgeries  are also affecting patient's functional outcome.   REHAB POTENTIAL: Fair - chronicity of condition and lumbar surgeries may affect prognosis  CLINICAL DECISION MAKING: Evolving/moderate complexity  EVALUATION COMPLEXITY: Moderate   GOALS: Goals reviewed with patient? No  SHORT TERM GOALS: Target date: 02/01/2024   Pt will be compliant and knowledgeable with initial HEP for improved comfort and carryover Baseline: initial HEP given  Goal status: INITIAL  2.  Pt will self report back and LE pain no greater than 6/10 for improved comfort and functional ability Baseline: 10/10 at worst Goal status: INITIAL   LONG TERM GOALS: Target date: 03/08/2024  Pt will be decrease ODI disability score to no greater than 60% (30/50) as proxy for functional improvement Baseline: 74% disability (37/50) Goal status: INITIAL  2.  Pt will self report back and LE pain no greater than 3/10 for improved comfort and functional ability Baseline: 10/10 at worst Goal status: INITIAL   3.  Pt will increase 30 Second Sit to Stand rep count to no less than 5 reps for improved balance, strength, and functional mobility Baseline: 2 reps with UE Goal status: INITIAL   4.  Pt will improve bilateral hip abd/flex MMT to at least 4/5 for improving functional mobility and decreasing pain Baseline: see MMT chart Goal status: INITIAL   PLAN:  PT FREQUENCY: 1-2x/week  PT DURATION: 8 weeks  PLANNED INTERVENTIONS: 97164- PT Re-evaluation, 97110-Therapeutic exercises, 97530- Therapeutic activity, [id number]- Neuromuscular re-education, 97535- Self  Care, 02859- Manual therapy, [id number]- Gait training, [id number]- Electrical stimulation (unattended), [id number]- Electrical stimulation (manual), [id number]- Vasopneumatic device, 20560 (1-2 muscles), 20561 (3+ muscles)- Dry Needling, Cryotherapy, and Moist heat.  PLAN FOR NEXT SESSION: assess HEP response,  core/hip strengthening    Jamayia Croker, Student-PT 02/10/2024, 1:43 PM

## 2024-02-10 NOTE — Therapy (Signed)
 OUTPATIENT PHYSICAL THERAPY TREATMENT   Patient Name: Suzanne [name] MRN: [id number] DOB:[birth year], 48 y.o., female Today's Date: 02/10/2024  END OF SESSION:  PT End of Session - 02/10/24 1613     Visit Number 4    Number of Visits 17    Date for PT Re-Evaluation 03/08/24    Authorization Type [employer] MCD    PT Start Time 1615    PT Stop Time 1655    PT Time Calculation (min) 40 min    Activity Tolerance Patient tolerated treatment well    Behavior During Therapy WFL for tasks assessed/performed             Past Medical History:  Diagnosis Date   Anemia    Diabetes mellitus without complication (HCC)    GERD (gastroesophageal reflux disease)    Headache    History of kidney stones    Hypertension    Sciatica    Past Surgical History:  Procedure Laterality Date   CERVICAL BIOPSY     CESAREAN SECTION N/A 05/15/2016   Procedure: CESAREAN SECTION;  Surgeon: Norleen LULLA Server, MD;  Location: [facility] BIRTHING SUITES;  Service: Obstetrics;  Laterality: N/A;   COLPOSCOPY     CYSTOSCOPY WITH RETROGRADE PYELOGRAM, URETEROSCOPY AND STENT PLACEMENT Left 05/03/2023   Procedure: CYSTOSCOPY WITH RETROGRADE PYELOGRAM, URETEROSCOPY AND STENT PLACEMENT;  Surgeon: Renda Glance, MD;  Location: [facility] OR[facility];  Service: Urology;  Laterality: Left;   EXTRACORPOREAL SHOCK WAVE LITHOTRIPSY Left 05/31/2023   Procedure: EXTRACORPOREAL SHOCK WAVE LITHOTRIPSY (ESWL);  Surgeon: Shona Layman BROCKS, MD;  Location: [facility];  Service: Urology;  Laterality: Left;   LUMBAR LAMINECTOMY/ DECOMPRESSION WITH MET-RX Left 10/19/2022   Procedure: Left Lumbar five-Sacral one Microlaminectomy and discectomy;  Surgeon: Cheryle Debby LABOR, MD;  Location: [facility] OR;  Service: Neurosurgery;  Laterality: Left;   LUMBAR LAMINECTOMY/ DECOMPRESSION WITH MET-RX Left 04/27/2023   Procedure: Revision Left Minimally Invasive Lumbar Five-Sacral One Discectomy;  Surgeon: Cheryle Debby LABOR, MD;  Location: [facility] OR;  Service:  Neurosurgery;  Laterality: Left;  3C   Patient Active Problem List   Diagnosis Date Noted   Left ureteral stone 05/03/2023   Intractable pain 05/03/2023   Hypokalemia 05/03/2023   AKI (acute kidney injury) (HCC) 05/02/2023   Herniation of lumbar intervertebral disc with radiculopathy 04/27/2023   GERD (gastroesophageal reflux disease) 01/09/2016   Hypertension, benign 01/09/2016    PCP: Sim Emery CROME, MD  REFERRING PROVIDER: Debby Dorn MATSU, MD  REFERRING DIAG: M51.16 (ICD-10-CM) - Intervertebral disc disorders with radiculopathy, lumbar region   Rationale for Evaluation and Treatment: Rehabilitation  THERAPY DIAG:  Other low back pain  Muscle weakness (generalized)  Other abnormalities of gait and mobility  ONSET DATE: Chronic  SUBJECTIVE:  SUBJECTIVE STATEMENT: Pt attended today's session with reports of high levels of pain however would not give a numerical value, just stated that she's taken quite a bit of her medication, feeling a little bit loopy today. Couldn't even get out of bed yesterday to eat because of the pain.   EVAL: Pt presents to PT with reports of chronic LBP with referral into bilateral LE, L>R. Also confirms N/T down to lateral L foot. Previous lumbar laminectomy and debridement with no change in status. They discussed lumbar fusion as next step. Denies bowel/bladder changes or saddle anesthesia.   PERTINENT HISTORY:  DM II, HTN, Lumbar surgeries   PAIN:  Are you having pain?  Yes: NPRS scale: 7/10 Worst: 10/10 Pain location: low back, posterior LE L>R Pain description: sharp, N/T Aggravating factors: prolonged standing/sitting, walking Relieving factors: medication   PRECAUTIONS: None  RED FLAGS: None   WEIGHT BEARING RESTRICTIONS: No  FALLS:  Has  patient fallen in last 6 months? Yes. Number of falls one - fell off deck leading to increased back pain  LIVING ENVIRONMENT: Lives with: lives with their family Lives in: House/apartment Stairs: at back deck Has following equipment at home: None  OCCUPATION: No   PLOF: Independent  PATIENT GOALS: decrease back pain, improve comfort and functional mobility   OBJECTIVE:  Note: Objective measures were completed at Evaluation unless otherwise noted.  DIAGNOSTIC FINDINGS:  See imaging   PATIENT SURVEYS:  ODI: 37/50  COGNITION: Overall cognitive status: Within functional limits for tasks assessed     SENSATION: Light touch: Impaired L LE  MUSCLE LENGTH: Thomas test: Right (+); Left (+)  POSTURE: rounded shoulders, forward head, and increased lumbar lordosis  PALPATION: TTP to L gluteals, L lumbar paraspinals  LUMBAR ROM:   AROM eval  Flexion 25%  Extension 25%  Right lateral flexion   Left lateral flexion   Right rotation 50%  Left rotation 50%   (Blank rows = not tested)  LOWER EXTREMITY MMT:    MMT Right eval Left eval  Hip flexion 3+ 3+  Hip extension    Hip abduction 3+ 3+  Hip adduction    Hip internal rotation    Hip external rotation    Knee flexion 4 3+  Knee extension 4 3+  Ankle dorsiflexion    Ankle plantarflexion    Ankle inversion    Ankle eversion     (Blank rows = not tested)  LUMBAR SPECIAL TESTS:  Straight leg raise test: Negative and Slump test: Negative  FUNCTIONAL TESTS:  30 Second Sit to Stand: 2 reps  GAIT: Distance walked: 4ft Assistive device utilized: None Level of assistance: SBA Comments: antalgic gait L, flexed trunk  TREATMENT: [facility] Adult PT Treatment:                                                DATE: 02/10/2024 Neuromuscular re-ed: Core bracing practice with PPT and cough cue 2x10, hold for 1 full breath ea. Figure 4 w/ sciatic glide 2x12 B, stretch strap for assistance Therapeutic Activity: NuStep  5' Small range bridge 2x12, hold 3s Supine hip fall out, GTB 2x10, Cue for core brace Modalities: MHP during supine exercises    [facility] Adult PT Treatment:  DATE: 02/10/2024 NuStep UE/LE lvl 3 x 3 for functional activity tolerance Supine PPT x 10 - 5 hold Supine PPT with ball 2x10 LTR x 10  Hooklying clamshell 2x10 GTB Bridge (small range) 2x10 Supine fig 4 push/pull 2x30 L Seated fig 4 stretch 2x30 L Standing hip ext 2x5 ea Manual Therapy:  Supine lumbar traction using physioball Manual stretching to bilateral hamstrings with sciatic nerve glide 2x10 each Modalities:  MHP to lumbar spine during supine exercises   [facility] Adult PT Treatment:                                                DATE: 02/02/2024 NuStep UE/LE lvl 3 x 3 for functional activity tolerance Supine PPT x 10 - 5 hold Supine PPT with ball 2x10 LTR x 10  Hooklying clamshell 2x10 GTB Bridge (small range) 2x10 Supine fig 4 push/pull 2x30 L Seated fig 4 stretch 2x30 L Standing hip ext 2x5 ea Manual Therapy:  Supine lumbar traction using physioball Manual stretching to bilateral hamstrings with sciatic nerve glide 2x10 each Modalities:  MHP to lumbar spine during supine exercises   PATIENT EDUCATION:  Education details: eval findings, ODI, HEP, POC Person educated: Patient Education method: Explanation, Demonstration, and Handouts Education comprehension: verbalized understanding and returned demonstration  HOME EXERCISE PROGRAM: Access Code: [email or web address] URL: [email or web address] Date: 02/03/2024 Prepared by: Alm Kingdom  Exercises - Supine Sciatic Nerve Glide  - 1 x daily - 7 x weekly - 3 sets - 10 reps - Supine Posterior Pelvic Tilt  - 1 x daily - 7 x weekly - 2 sets - 10 reps - 5 sec hold - Hooklying Clamshell with Resistance  - 1 x daily - 7 x weekly - 3 sets - 10 reps - green band hold - Beginner Bridge  - 1 x daily - 7 x weekly - 2  sets - 10 reps - Seated Figure 4 Piriformis Stretch  - 1 x daily - 7 x weekly - 2 sets - 30 sec hold  ASSESSMENT:  CLINICAL IMPRESSION: Pt attended physical therapy session for continuation of treatment regarding LBP w/radicular symptoms. Today's treatment focused on improvement of  core activation patterns, lumbar stability, global hip strength/gluteal recruitment, and sciatic nerve motility. Pt is demonstrating improved pain reported symptoms by the end of today's session, based on subjective reports and physical observation, it may be worth assessing tolerance with an extension based program in future sessions. Pt showed good tolerance to administered treatment with no adverse effects by the end of session. Skilled intervention was utilized via activity modification for pt tolerance with task completion, functional progression/regression promoting best outcomes inline with current rehab goals, as well as moderate verbal/tactile cuing alongside no physical assistance for safe and appropriate performance of today's activities.    EVAL: Patient is a 48 y.o. F who was seen today for physical therapy evaluation and treatment for chronic LBP with referral into bilateral LE L>R. Physical findings are consistent with MD impression as pt has significant weakness and functional mobility deficits and can not sit comfortably without considerable weight shift. ODI score shows severe disability in performance of home ADLs and community activities. Pt would benefit from skilled PT, will continue to progress as able per POC.    OBJECTIVE IMPAIRMENTS: Abnormal gait, decreased activity tolerance, decreased mobility, difficulty walking, decreased ROM, decreased strength, postural dysfunction, and  pain  ACTIVITY LIMITATIONS: carrying, lifting, standing, squatting, sleeping, stairs, transfers, reach over head, hygiene/grooming, and locomotion level  PARTICIPATION LIMITATIONS: meal prep, cleaning, driving, shopping,  community activity, occupation, and yard work  PERSONAL FACTORS: Time since onset of injury/illness/exacerbation and 3+ comorbidities: DM II, HTN, Lumbar surgeries  are also affecting patient's functional outcome.   REHAB POTENTIAL: Fair - chronicity of condition and lumbar surgeries may affect prognosis  CLINICAL DECISION MAKING: Evolving/moderate complexity  EVALUATION COMPLEXITY: Moderate   GOALS: Goals reviewed with patient? No  SHORT TERM GOALS: Target date: 02/01/2024   Pt will be compliant and knowledgeable with initial HEP for improved comfort and carryover Baseline: initial HEP given  Goal status: INITIAL  2.  Pt will self report back and LE pain no greater than 6/10 for improved comfort and functional ability Baseline: 10/10 at worst Goal status: INITIAL   LONG TERM GOALS: Target date: 03/08/2024  Pt will be decrease ODI disability score to no greater than 60% (30/50) as proxy for functional improvement Baseline: 74% disability (37/50) Goal status: INITIAL  2.  Pt will self report back and LE pain no greater than 3/10 for improved comfort and functional ability Baseline: 10/10 at worst Goal status: INITIAL   3.  Pt will increase 30 Second Sit to Stand rep count to no less than 5 reps for improved balance, strength, and functional mobility Baseline: 2 reps with UE Goal status: INITIAL   4.  Pt will improve bilateral hip abd/flex MMT to at least 4/5 for improving functional mobility and decreasing pain Baseline: see MMT chart Goal status: INITIAL   PLAN:  PT FREQUENCY: 1-2x/week  PT DURATION: 8 weeks  PLANNED INTERVENTIONS: 97164- PT Re-evaluation, 97110-Therapeutic exercises, 97530- Therapeutic activity, [id number]- Neuromuscular re-education, 97535- Self Care, 02859- Manual therapy, [id number]- Gait training, H9716- Electrical stimulation (unattended), [id number]- Electrical stimulation (manual), 97016- Vasopneumatic device, 20560 (1-2 muscles), 20561 (3+ muscles)- Dry  Needling, Cryotherapy, and Moist heat.  PLAN FOR NEXT SESSION: assess HEP response, core/hip strengthening    Mabel Kiang, PT, DPT 02/10/2024, 4:59 PM

## 2024-02-14 ENCOUNTER — Emergency Department (HOSPITAL_COMMUNITY)

## 2024-02-14 ENCOUNTER — Emergency Department (HOSPITAL_COMMUNITY)
Admission: EM | Admit: 2024-02-14 | Discharge: 2024-02-14 | Disposition: A | Discharge status: Home / Self Care | Attending: Emergency Medicine | Admitting: Emergency Medicine

## 2024-02-14 ENCOUNTER — Encounter (HOSPITAL_COMMUNITY): Payer: Self-pay

## 2024-02-14 ENCOUNTER — Other Ambulatory Visit: Payer: Self-pay

## 2024-02-14 DIAGNOSIS — M5431 Sciatica, right side: Secondary | ICD-10-CM

## 2024-02-14 DIAGNOSIS — Z7984 Long term (current) use of oral hypoglycemic drugs: Secondary | ICD-10-CM | POA: Insufficient documentation

## 2024-02-14 DIAGNOSIS — I10 Essential (primary) hypertension: Secondary | ICD-10-CM | POA: Diagnosis not present

## 2024-02-14 DIAGNOSIS — M545 Low back pain, unspecified: Secondary | ICD-10-CM | POA: Insufficient documentation

## 2024-02-14 DIAGNOSIS — Z91040 Latex allergy status: Secondary | ICD-10-CM | POA: Diagnosis not present

## 2024-02-14 DIAGNOSIS — M79662 Pain in left lower leg: Secondary | ICD-10-CM | POA: Diagnosis not present

## 2024-02-14 DIAGNOSIS — M5116 Intervertebral disc disorders with radiculopathy, lumbar region: Secondary | ICD-10-CM

## 2024-02-14 DIAGNOSIS — M549 Dorsalgia, unspecified: Secondary | ICD-10-CM | POA: Diagnosis present

## 2024-02-14 DIAGNOSIS — Z79899 Other long term (current) drug therapy: Secondary | ICD-10-CM | POA: Diagnosis not present

## 2024-02-14 LAB — CBC WITH DIFFERENTIAL/PLATELET
Abs Immature Granulocytes: 0.03 K/uL (ref 0.00–0.07)
Basophils Absolute: 0 K/uL (ref 0.0–0.1)
Basophils Relative: 0 %
Eosinophils Absolute: 0.2 K/uL (ref 0.0–0.5)
Eosinophils Relative: 2 %
HCT: 35.8 % — ABNORMAL LOW (ref 36.0–46.0)
Hemoglobin: 11.9 g/dL — ABNORMAL LOW (ref 12.0–15.0)
Immature Granulocytes: 0 %
Lymphocytes Relative: 36 %
Lymphs Abs: 3 K/uL (ref 0.7–4.0)
MCH: 32.6 pg (ref 26.0–34.0)
MCHC: 33.2 g/dL (ref 30.0–36.0)
MCV: 98.1 fL (ref 80.0–100.0)
Monocytes Absolute: 0.4 K/uL (ref 0.1–1.0)
Monocytes Relative: 5 %
Neutro Abs: 4.7 K/uL (ref 1.7–7.7)
Neutrophils Relative %: 57 %
Platelets: 303 K/uL (ref 150–400)
RBC: 3.65 MIL/uL — ABNORMAL LOW (ref 3.87–5.11)
RDW: 12.2 % (ref 11.5–15.5)
WBC: 8.3 K/uL (ref 4.0–10.5)
nRBC: 0 % (ref 0.0–0.2)

## 2024-02-14 LAB — BASIC METABOLIC PANEL WITH GFR
Anion gap: 7 (ref 5–15)
BUN: 10 mg/dL (ref 6–20)
CO2: 25 mmol/L (ref 22–32)
Calcium: 9.2 mg/dL (ref 8.9–10.3)
Chloride: 107 mmol/L (ref 98–111)
Creatinine, Ser: 0.89 mg/dL (ref 0.44–1.00)
GFR, Estimated: >60 mL/min (ref >60)
Glucose, Bld: 103 mg/dL — ABNORMAL HIGH (ref 70–99)
Potassium: 3.3 mmol/L — ABNORMAL LOW (ref 3.5–5.1)
Sodium: 139 mmol/L (ref 135–145)

## 2024-02-14 LAB — HCG, SERUM, QUALITATIVE: Preg, Serum: NEGATIVE

## 2024-02-14 LAB — HCG, QUANTITATIVE, PREGNANCY: hCG, Beta Chain, Quant, S: 2 m[IU]/mL (ref <5)

## 2024-02-14 MED ORDER — AMLODIPINE BESYLATE 5 MG PO TABS
10.0000 mg | ORAL_TABLET | Freq: Once | ORAL | Status: AC
  Administered 2024-02-14: 10 mg via ORAL
  Filled 2024-02-14: qty 2

## 2024-02-14 MED ORDER — HYDROMORPHONE HCL 1 MG/ML IJ SOLN
1.0000 mg | Freq: Once | INTRAMUSCULAR | Status: AC
  Administered 2024-02-14: 1 mg via INTRAVENOUS
  Filled 2024-02-14: qty 1

## 2024-02-14 MED ORDER — MORPHINE SULFATE (PF) 4 MG/ML IV SOLN
4.0000 mg | Freq: Once | INTRAVENOUS | Status: AC
  Administered 2024-02-14: 4 mg via INTRAVENOUS
  Filled 2024-02-14: qty 1

## 2024-02-14 MED ORDER — GABAPENTIN 300 MG PO CAPS: 300.0000 mg | ORAL_CAPSULE | Freq: Three times a day (TID) | ORAL | 0 refills | Status: DC

## 2024-02-14 MED ORDER — PREDNISONE 10 MG (21) PO TBPK: ORAL_TABLET | Freq: Every day | ORAL | 0 refills | Status: AC

## 2024-02-14 MED ORDER — OXYCODONE-ACETAMINOPHEN 5-325 MG PO TABS
2.0000 | ORAL_TABLET | Freq: Once | ORAL | Status: AC
  Administered 2024-02-14: 2 via ORAL
  Filled 2024-02-14: qty 2

## 2024-02-14 MED ORDER — OXYCODONE-ACETAMINOPHEN 5-325 MG PO TABS: 1.0000 | ORAL_TABLET | Freq: Four times a day (QID) | ORAL | 0 refills | Status: DC | PRN

## 2024-02-14 MED ORDER — ONDANSETRON 4 MG PO TBDP
8.0000 mg | ORAL_TABLET | Freq: Once | ORAL | Status: AC
  Administered 2024-02-14: 8 mg via ORAL
  Filled 2024-02-14: qty 2

## 2024-02-14 MED ORDER — GABAPENTIN 100 MG PO CAPS
200.0000 mg | ORAL_CAPSULE | Freq: Once | ORAL | Status: AC
  Administered 2024-02-14: 200 mg via ORAL
  Filled 2024-02-14: qty 2

## 2024-02-14 NOTE — ED Triage Notes (Signed)
 Pt from home for lower back pain. Hx of herniated disc and sciatica. Pt takes gabapentin . Axox4. Denies urinary issues.

## 2024-02-14 NOTE — ED Notes (Signed)
Assisted patient onto bed side commode

## 2024-02-14 NOTE — ED Provider Notes (Signed)
 [place] EMERGENCY DEPARTMENT AT [facility] Provider Note   CSN: [id number] Arrival date & time: 02/14/24  9186     Patient presents with: Sciatica   Suzanne [name] is a 48 y.o. female who presents to the ED today with history of chronic back pain that has been present since a herniated lumbar disc.  She has had multiple surgeries, most recently had spinal fusion in October 2024, note from 28 April 2019 for showed that she had a repeat L5-S1 discectomy.  Has been taking 300 mg of gabapentin  since then, but was recently decreased to 100 mg 3 times daily.  As this is, by patient report, not controlling her pain; she has been taking a dose of above what has been prescribed and has therefore run out of her gabapentin .  She states that with 300 mg of gabapentin  she has adequate pain control.  Pain originates in the left gluteus and radiates distally down the left lateral leg.  Is being worked up by neurosurgery with physical therapy, pending reimaging and possible further surgical procedures after reimaging of the lumbar spine.  No recent injuries or falls.  Last known injury was on Mother's Day 2025 where she had a fall and known reinjury of the previous surgical site.  She further has a history of hypertension for which she takes amlodipine , has not been able to take her medication for the last 2 days as she has had reduced p.o. intake secondary to pain.  HPI     Prior to Admission medications   Medication Sig Start Date End Date Taking? Authorizing Provider  acetaminophen  (TYLENOL ) 500 MG tablet Take 500 mg by mouth as needed for moderate pain.    [provider]  amLODipine  (NORVASC ) 10 MG tablet Take 1 tablet (10 mg total) by mouth daily. Patient taking differently: Take 10 mg by mouth at bedtime. 08/18/17   Wenzel, Julie N, PA-C  hydrALAZINE  (APRESOLINE ) 10 MG tablet Take 1 tablet (10 mg total) by mouth in the morning and at bedtime. 06/26/22 10/13/23  Cottie Donnice PARAS,  MD  metFORMIN  (GLUCOPHAGE ) 500 MG tablet Take 1 tablet (500 mg total) by mouth 2 (two) times daily with a meal. 05/04/23 07/03/23  Rojelio Nest, DO  omeprazole  (PRILOSEC) 20 MG capsule Take 1 capsule (20 mg total) by mouth in the morning. 05/04/23   Rojelio Nest, DO  oxyCODONE  (ROXICODONE ) 5 MG immediate release tablet Take 1 tablet (5 mg total) by mouth every 4 (four) hours as needed for severe pain (pain score 7-10). 05/31/23   Shona Layman BROCKS, MD    Allergies: Coconut flavoring agent (non-screening), Latex, Pineapple, and Tomato    Review of Systems  Musculoskeletal:  Positive for back pain.  All other systems reviewed and are negative.   Updated Vital Signs BP (!) 168/106   Pulse 73   Temp 97.9 F (36.6 C) (Oral)   Resp 17   Ht 5' 5 (1.651 m)   Wt 85.3 kg   SpO2 100%   BMI 31.28 kg/m   Physical Exam Vitals and nursing note reviewed.  Constitutional:      General: She is not in acute distress.    Appearance: Normal appearance.  HENT:     Head: Normocephalic and atraumatic.     Mouth/Throat:     Mouth: Mucous membranes are moist.     Pharynx: Oropharynx is clear.  Eyes:     Extraocular Movements: Extraocular movements intact.     Conjunctiva/sclera: Conjunctivae normal.  Pupils: Pupils are equal, round, and reactive to light.  Cardiovascular:     Rate and Rhythm: Normal rate and regular rhythm.     Pulses: Normal pulses.     Heart sounds: Normal heart sounds. No murmur heard.    No friction rub. No gallop.  Pulmonary:     Effort: Pulmonary effort is normal.     Breath sounds: Normal breath sounds.  Abdominal:     General: Abdomen is flat. Bowel sounds are normal.     Palpations: Abdomen is soft.  Musculoskeletal:     Cervical back: Normal, normal range of motion and neck supple.     Thoracic back: Normal.     Lumbar back: Positive left straight leg raise test.     Right lower leg: No edema.     Left lower leg: No edema.     Comments: No pain with  palpation of the left greater trochanter.  Skin:    General: Skin is warm and dry.     Capillary Refill: Capillary refill takes less than 2 seconds.  Neurological:     General: No focal deficit present.     Mental Status: She is alert. Mental status is at baseline.  Psychiatric:        Mood and Affect: Mood normal.     (all labs ordered are listed, but only abnormal results are displayed) Labs Reviewed - No data to display  EKG: None  Radiology: No results found.   Procedures   Medications Ordered in the ED  oxyCODONE -acetaminophen  (PERCOCET/ROXICET) 5-325 MG per tablet 2 tablet (has no administration in time range)  ondansetron  (ZOFRAN -ODT) disintegrating tablet 8 mg (has no administration in time range)  amLODipine  (NORVASC ) tablet 10 mg (has no administration in time range)    Clinical Course as of 02/14/24 1516  Mon Feb 14, 2024  1038 After oxycodone  and gabapentin , attempted to ambulate patient.  She was not able to tolerate standing posture without excessive pain.  At this time we will order CT imaging of the lumbar spine along with basic labs and reassess. [JG]    Clinical Course User Index [JG] [name], PA                                 Medical Decision Making Amount and/or Complexity of Data Reviewed Labs: ordered. Radiology: ordered.  Risk Prescription drug management.   Medical Decision Making:   Suzanne [name] is a 48 y.o. female who presented to the ED today with radicular left lower extremity pain/back pain detailed above.     Complete initial physical exam performed, notably the patient  was alert and oriented in no apparent distress though apparently uncomfortable.  Physical exam as noted.    Reviewed and confirmed nursing documentation for past medical history, family history, social history.    Initial Assessment:   With the patient's presentation of back pain/radicular left lower extremity pain, most likely diagnosis is continued  sciatica secondary to herniated lumbar disc.    Initial Plan:  Provide oxycodone  for pain management Provide amlodipine  for blood pressure control Objective evaluation as below reviewed   Initial Study Results:   Laboratory  All laboratory results reviewed without evidence of clinically relevant pathology.   Exceptions include: Hemoglobin is 11.9 (stable compared to previous hemoglobins over the last year   Radiology:  Results of CT lumbar pending at time of care handoff.  Reassessment and Plan:  After assessing this patient, she has been given the substantial amount of pain medication, however there are no objective findings of present suggesting any acute changes in her condition.  At time of care handoff, awaiting CT lumbar read to evaluate for any acute changes in her L-spine.  Disposition at this time is pending this read, if normal would anticipate discharge with outpatient prescription for gabapentin  as noted, and follow-up with her neurosurgeon.  Care handed off to A. Dombroski, DO.       Final diagnoses:  None    ED Discharge Orders     None          [name], [place] 02/14/24 1518    Yolande Lamar BROCKS, MD 02/19/24 2109

## 2024-02-14 NOTE — ED Provider Notes (Signed)
  Physical Exam  BP (!) 151/93   Pulse 79   Temp 98.8 F (37.1 C) (Temporal)   Resp 16   Ht 5' 5 (1.651 m)   Wt 85.3 kg   SpO2 100%   BMI 31.28 kg/m   On my evaluation, patient seen resting comfortably in bed in no acute distress.   ED Course / MDM   Clinical Course as of 02/14/24 1801  Mon Feb 14, 2024  1038 After oxycodone  and gabapentin , attempted to ambulate patient.  She was not able to tolerate standing posture without excessive pain.  At this time we will order CT imaging of the lumbar spine along with basic labs and reassess. [JG]  1551 S. Multiple prior L-spine surgeries, remote fall with known herniated disc 2 mo ago, out of her home meds.  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 Left nephrolithiasis.   [AD]    Clinical Course User Index [AD] Raoul Rake, MD [JG] [name], PA   Medical Decision Making As above in ED course, patient presented to the ED today with worsening lower back pain and difficulty walking d/t the pain, with long-standing history of lower back issues s/p two prior surgeries on L5-S1 region. At time of handoff, patient was pending results of CT L-spine, which resulted as above.  Neurosurgery was consulted given her complicated history with 2 prior surgeries for similar findings in the same location previously, and they recommended discharge with pain control and short course of prednisone  until patient's previously scheduled close outpatient NSGY f/u to further discuss surgery plan. Patient updated on results above and is amenable to this plan. Patient was given prescriptions for short course of pain meds and prednisone  taper, and was discharged in overall stable condition.  Amount and/or Complexity of  Data Reviewed Labs: ordered. Radiology: ordered. Decision-making details documented in ED Course.  Risk Prescription drug management.      Raoul Rake, MD 02/15/24 9960    Bernard Drivers, MD 02/28/24 1517

## 2024-02-14 NOTE — Discharge Instructions (Addendum)
 Please follow-up with your neurosurgeon as listed in your referral information within the next several days for continued follow-up.  Prescriptions for continue gabapentin  as well as for oxycodone  have been sent into your pharmacy to continue to use until you are able to see your regular physician. You have also been prescribed a prednisone  taper for your symptoms, please take as advised on the bottle.

## 2024-02-15 ENCOUNTER — Ambulatory Visit: Admitting: Physical Therapy

## 2024-03-07 ENCOUNTER — Ambulatory Visit: Attending: Neurosurgery

## 2024-03-07 ENCOUNTER — Other Ambulatory Visit: Payer: Self-pay

## 2024-03-07 DIAGNOSIS — R2689 Other abnormalities of gait and mobility: Secondary | ICD-10-CM | POA: Diagnosis present

## 2024-03-07 DIAGNOSIS — M5459 Other low back pain: Secondary | ICD-10-CM | POA: Diagnosis present

## 2024-03-07 DIAGNOSIS — M6281 Muscle weakness (generalized): Secondary | ICD-10-CM | POA: Insufficient documentation

## 2024-03-07 NOTE — Therapy (Signed)
 OUTPATIENT PHYSICAL THERAPY THORACOLUMBAR EVALUATION   Patient Name: Suzanne [name] MRN: [id number] DOB:Mar 10, 1976, 48 y.o., female Today's Date: 03/08/2024  END OF SESSION:  PT End of Session - 03/07/24 1440     Visit Number 1    Number of Visits 17    Date for PT Re-Evaluation 05/02/24    Authorization Type [employer] MCD    PT Start Time 1442    PT Stop Time 1522    PT Time Calculation (min) 40 min    Activity Tolerance Patient limited by pain    Behavior During Therapy WFL for tasks assessed/performed          Past Medical History:  Diagnosis Date   Anemia    Diabetes mellitus without complication (HCC)    GERD (gastroesophageal reflux disease)    Headache    History of kidney stones    Hypertension    Sciatica    Past Surgical History:  Procedure Laterality Date   CERVICAL BIOPSY     CESAREAN SECTION N/A 05/15/2016   Procedure: CESAREAN SECTION;  Surgeon: Norleen LULLA Server, MD;  Location: [facility];  Service: Obstetrics;  Laterality: N/A;   COLPOSCOPY     CYSTOSCOPY WITH RETROGRADE PYELOGRAM, URETEROSCOPY AND STENT PLACEMENT Left 05/03/2023   Procedure: CYSTOSCOPY WITH RETROGRADE PYELOGRAM, URETEROSCOPY AND STENT PLACEMENT;  Surgeon: Renda Glance, MD;  Location: [facility];  Service: Urology;  Laterality: Left;   EXTRACORPOREAL SHOCK WAVE LITHOTRIPSY Left 05/31/2023   Procedure: EXTRACORPOREAL SHOCK WAVE LITHOTRIPSY (ESWL);  Surgeon: Shona Layman BROCKS, MD;  Location: [facility];  Service: Urology;  Laterality: Left;   LUMBAR LAMINECTOMY/ DECOMPRESSION WITH MET-RX Left 10/19/2022   Procedure: Left Lumbar five-Sacral one Microlaminectomy and discectomy;  Surgeon: Cheryle Debby LABOR, MD;  Location: [facility];  Service: Neurosurgery;  Laterality: Left;   LUMBAR LAMINECTOMY/ DECOMPRESSION WITH MET-RX Left 04/27/2023   Procedure: Revision Left Minimally Invasive Lumbar Five-Sacral One Discectomy;  Surgeon: Cheryle Debby LABOR, MD;  Location: [facility];  Service:  Neurosurgery;  Laterality: Left;  3C   Patient Active Problem List   Diagnosis Date Noted   Left ureteral stone 05/03/2023   Intractable pain 05/03/2023   Hypokalemia 05/03/2023   AKI (acute kidney injury) (HCC) 05/02/2023   Herniation of lumbar intervertebral disc with radiculopathy 04/27/2023   GERD (gastroesophageal reflux disease) 01/09/2016   Hypertension, benign 01/09/2016    PCP: Sim Emery CROME, MD  REFERRING PROVIDER: Debby Dorn MATSU, MD  REFERRING DIAG: M51.16 (ICD-10-CM) - Intervertebral disc disorders with radiculopathy, lumbar region   Rationale for Evaluation and Treatment: Rehabilitation  THERAPY DIAG:  Other low back pain  Muscle weakness (generalized)  Other abnormalities of gait and mobility  ONSET DATE: chronic  SUBJECTIVE:  SUBJECTIVE STATEMENT:  Pt reports to physical therapy for evaluation and treatment of left sided LBP with L radicular pain that started April with no clear activity that lead to the onset. Pt states it can come and go, she has experienced similar pain in the past which has lead to multiple back surgeries at the L5-S1 region. Received short term relief from surgery but it did not last long. Pt takes gabapentin  and tylenol  and gets some relief. Most mornings she wakes up in a lot of pain and has to take medication. Can walk longer distances when on medication. Pt reports she has to sleep on R side but still finds little comfort, not been sleeping well. She has a 46 year old child that she cares for  PERTINENT HISTORY:  HTN DM L5-S1 discectomy and laminectomy (2024)  PAIN:  Are you having pain?  Yes: NPRS scale: 5/10 current, 10/10 worst - takes ~ 20 min to come to baseline Pain location: back of thigh back, back of calf, 3-5 toes are numb Pain  description: knife-like pain, deep, down back of leg Aggravating factors: sitting for long periods, bending over, standing for more than 15 minutes  Relieving factors: meds and rest (sometimes)  PRECAUTIONS: None  RED FLAGS: None   WEIGHT BEARING RESTRICTIONS: No  FALLS:  Has patient fallen in last 6 months? Yes. Number of falls 1, new shoes fell back wards   LIVING ENVIRONMENT: Lives with: lives with their family Lives in: House/apartment Stairs:  Has following equipment at home: Single point cane  OCCUPATION:   PLOF: Independent  PATIENT GOALS: to improve pain and participate more in functional activities  NEXT MD VISIT: Not scheduled  OBJECTIVE:  Note: Objective measures were completed at Evaluation unless otherwise noted.  DIAGNOSTIC FINDINGS:  See imaging  PATIENT SURVEYS:  Modified ODI: 35/50  COGNITION: Overall cognitive status: Within functional limits for tasks assessed     SENSATION: Diminished @ L5-S1  MUSCLE LENGTH: NT  POSTURE: rounded shoulders and forward head  PALPATION: NT  LUMBAR ROM:   AROM eval  Flexion 25%  Extension 25%  Right lateral flexion 50%  Left lateral flexion 25%  Right rotation   Left rotation    (Blank rows = not tested)  LOWER EXTREMITY ROM:     Active  Right eval Left eval  Hip flexion    Hip extension    Hip abduction    Hip adduction    Hip internal rotation    Hip external rotation    Knee flexion    Knee extension    Ankle dorsiflexion    Ankle plantarflexion    Ankle inversion    Ankle eversion     (Blank rows = not tested)  LOWER EXTREMITY MMT:    MMT Right eval Left eval  Hip flexion 4 3+  Hip extension    Hip abduction    Hip adduction    Hip internal rotation    Hip external rotation    Knee flexion 5 5  Knee extension 5 5  Ankle dorsiflexion 5 4  Ankle plantarflexion 5 5  Ankle inversion    Ankle eversion     (Blank rows = not tested)  LUMBAR SPECIAL TESTS:  Straight leg  raise test: Positive  FUNCTIONAL TESTS:  30 seconds chair stand test: 3  GAIT: Distance walked: 43ft Assistive device utilized: Single point cane Level of assistance: Modified independence Comments: R lateral shift, slow and antalgic  TREATMENT DATE: TREATMENT: [facility]  PT Treatment:                                                DATE: 03/07/24 Therapeutic Exercise: Seated nerve glides LTRs x 10 PPTs x 10  Seated marches x 5 ea                                                                                                                                   PATIENT EDUCATION:  Education details: Educated on eval findings, Performance of HEP, POC Person educated: Patient Education method: Programmer, multimedia, Facilities manager, and Handouts Education comprehension: verbalized understanding  HOME EXERCISE PROGRAM: Access Code: [email or web address] URL: [email or web address] Date: 03/07/2024 Prepared by: Alm Kingdom Exercises   Seated Sciatic Nerve Glide With Cervical Motion  - 1 x daily - 7 x weekly - 2-3 sets - 10 reps   Supine Lower Trunk Rotation  - 1 x daily - 7 x weekly - 2 sets - 10 reps   Supine Posterior Pelvic Tilt  - 1 x daily - 7 x weekly - 2 sets - 10 reps - 5 sec hold   Supine March  - 1 x daily - 7 x weekly - 2 sets - 10 reps   ASSESSMENT:  CLINICAL IMPRESSION: Patient is a 48 y.o. Female who was seen today for physical therapy evaluation and treatment for low back pain with left sided radicular pain. Pt was in high level pain coming in today, ambulating slowly with SPC. Evaluation findings are consistent with MD diagnosis and impression from imaging findings. Pt having significant numbness and tingling sensation down into L foot with lumbar movements, a positive SLR, and diminished sensation of L5 and S1 dermatomes.  Patient-reported outcomes indicate high level of disability with required activities of the pt and 30 sec STS score of only 3 reveal large impairment in  functional tolerance, strength, and safety. The pt will benefit from skilled physical therapy for a several weeks to address deficits and assess response to therapy in an effort to reduce her symptoms  OBJECTIVE IMPAIRMENTS: Abnormal gait, decreased activity tolerance, decreased mobility, decreased strength, and pain.   ACTIVITY LIMITATIONS: carrying, lifting, bending, sitting, standing, squatting, sleeping, and stairs  PARTICIPATION LIMITATIONS: cleaning, laundry, driving, shopping, and community activity  PERSONAL FACTORS: Fitness, Past/current experiences, and 1-2 comorbidities: HTN and history of past lumbar surgeries are also affecting patient's functional outcome.   REHAB POTENTIAL: Fair chronicity and severity of symptoms, past history of back surgeries  CLINICAL DECISION MAKING: Stable/uncomplicated  EVALUATION COMPLEXITY: Low   GOALS: Goals reviewed with patient? No  SHORT TERM GOALS: Target date: 03/29/2024  Patient will be independent with their HEP to promote self-management of their condition and support progression toward functional goals. Baseline: HEP given at eval  Goal status: INITIAL  2.  Patient  will report a decrease in worst pain to 7/10 on the 0-10 scale to allow for improved participation in functional activities  Baseline: 10/10 Goal status: INITIAL   LONG TERM GOALS: Target date: 05/02/2024  Pt will tolerate standing and seated activities for no less than 30 minutes for improved comfort and participation in daily activities  Baseline: Goal status: INITIAL  2.  Patient will report a decrease in worst pain to 5/10 and baseline to no more than 3/10 on the 0-10 scale to allow for improved participation in functional activities  Baseline:  Goal status: INITIAL  3.  Pt will improve all lumbar ROM to at least 75% of WNL for improved functional mobility and comfort with daily activities Baseline: see ROM chart Goal status: INITIAL  4.  Patient will  improve performance on 30 sec STS from baseline of 3 to 8 to reflect improved functional mobility and show clinically significant change in status. Baseline: 3 Goal status: INITIAL  5.  Pt will improve score on Modified ODI from baseline of 35 to 22 to reflect significant change in perceived ability to perform functional tasks and demonstrate progress towards improving functional abilities Baseline: 35/50 Goal status: INITIAL    PLAN:  PT FREQUENCY: 2x/week  PT DURATION: 8 weeks  PLANNED INTERVENTIONS: 97110-Therapeutic exercises, 97530- Therapeutic activity, 97112- Neuromuscular re-education, 97535- Self Care, and 02859- Manual therapy.  PLAN FOR NEXT SESSION: Plan to assess response to HEP, review POC, continue and add exercises focused on lumbar mobility, continue with neural mobilizations.   Patricia Perales, SPT 03/08/24 8:50 AM

## 2024-03-08 NOTE — Addendum Note (Signed)
 Addended by: JOHNA ALM BROCKS on: 03/08/2024 01:45 PM   Modules accepted: Orders

## 2024-03-15 ENCOUNTER — Ambulatory Visit

## 2024-03-15 DIAGNOSIS — R2689 Other abnormalities of gait and mobility: Secondary | ICD-10-CM

## 2024-03-15 DIAGNOSIS — M5459 Other low back pain: Secondary | ICD-10-CM | POA: Diagnosis not present

## 2024-03-15 DIAGNOSIS — M6281 Muscle weakness (generalized): Secondary | ICD-10-CM

## 2024-03-15 NOTE — Therapy (Signed)
 OUTPATIENT PHYSICAL THERAPY TREATMENT   Patient Name: Suzanne [name] MRN: [id number] DOB:04/21/1976, 48 y.o., female Today's Date: 03/16/2024  END OF SESSION:  PT End of Session - 03/15/24 1400     Visit Number 2    Number of Visits 17    Date for PT Re-Evaluation 05/02/24    Authorization Type [employer] MCD    PT Start Time 1400    PT Stop Time 1440    PT Time Calculation (min) 40 min    Activity Tolerance Patient limited by pain    Behavior During Therapy WFL for tasks assessed/performed           Past Medical History:  Diagnosis Date   Anemia    Diabetes mellitus without complication (HCC)    GERD (gastroesophageal reflux disease)    Headache    History of kidney stones    Hypertension    Sciatica    Past Surgical History:  Procedure Laterality Date   CERVICAL BIOPSY     CESAREAN SECTION N/A 05/15/2016   Procedure: CESAREAN SECTION;  Surgeon: Norleen LULLA Server, MD;  Location: [facility];  Service: Obstetrics;  Laterality: N/A;   COLPOSCOPY     CYSTOSCOPY WITH RETROGRADE PYELOGRAM, URETEROSCOPY AND STENT PLACEMENT Left 05/03/2023   Procedure: CYSTOSCOPY WITH RETROGRADE PYELOGRAM, URETEROSCOPY AND STENT PLACEMENT;  Surgeon: Renda Glance, MD;  Location: [facility];  Service: Urology;  Laterality: Left;   EXTRACORPOREAL SHOCK WAVE LITHOTRIPSY Left 05/31/2023   Procedure: EXTRACORPOREAL SHOCK WAVE LITHOTRIPSY (ESWL);  Surgeon: Shona Layman BROCKS, MD;  Location: [facility];  Service: Urology;  Laterality: Left;   LUMBAR LAMINECTOMY/ DECOMPRESSION WITH MET-RX Left 10/19/2022   Procedure: Left Lumbar five-Sacral one Microlaminectomy and discectomy;  Surgeon: Cheryle Debby LABOR, MD;  Location: [facility];  Service: Neurosurgery;  Laterality: Left;   LUMBAR LAMINECTOMY/ DECOMPRESSION WITH MET-RX Left 04/27/2023   Procedure: Revision Left Minimally Invasive Lumbar Five-Sacral One Discectomy;  Surgeon: Cheryle Debby LABOR, MD;  Location: [facility];  Service: Neurosurgery;   Laterality: Left;  3C   Patient Active Problem List   Diagnosis Date Noted   Left ureteral stone 05/03/2023   Intractable pain 05/03/2023   Hypokalemia 05/03/2023   AKI (acute kidney injury) (HCC) 05/02/2023   Herniation of lumbar intervertebral disc with radiculopathy 04/27/2023   GERD (gastroesophageal reflux disease) 01/09/2016   Hypertension, benign 01/09/2016    PCP: Sim Emery CROME, MD  REFERRING PROVIDER: Sim Emery CROME, MD  REFERRING DIAG: M51.16 (ICD-10-CM) - Intervertebral disc disorders with radiculopathy, lumbar region   Rationale for Evaluation and Treatment: Rehabilitation  THERAPY DIAG:  Other low back pain  Muscle weakness (generalized)  Other abnormalities of gait and mobility  ONSET DATE: chronic  SUBJECTIVE:  SUBJECTIVE STATEMENT: Pt presents to PT with reports of 5/10 lower back and L LE pain. Has been fairly compliant with HEP, still feels like medication is helping.    EVAL: Pt reports to physical therapy for evaluation and treatment of left sided LBP with L radicular pain that started April with no clear activity that lead to the onset. Pt states it can come and go, she has experienced similar pain in the past which has lead to multiple back surgeries at the L5-S1 region. Received short term relief from surgery but it did not last long. Pt takes gabapentin  and tylenol  and gets some relief. Most mornings she wakes up in a lot of pain and has to take medication. Can walk longer distances when on medication. Pt reports she has to sleep on R side but still finds little comfort, not been sleeping well. She has a 67 year old child that she cares for  PERTINENT HISTORY:  HTN DM L5-S1 discectomy and laminectomy (2024)  PAIN:  Are you having pain?  Yes: NPRS scale: 5/10  current, 10/10 worst - takes ~ 20 min to come to baseline Pain location: back of thigh back, back of calf, 3-5 toes are numb Pain description: knife-like pain, deep, down back of leg Aggravating factors: sitting for long periods, bending over, standing for more than 15 minutes  Relieving factors: meds and rest (sometimes)  PRECAUTIONS: None  RED FLAGS: None   WEIGHT BEARING RESTRICTIONS: No  FALLS:  Has patient fallen in last 6 months? Yes. Number of falls 1, new shoes fell back wards   LIVING ENVIRONMENT: Lives with: lives with their family Lives in: House/apartment Stairs:  Has following equipment at home: Single point cane  OCCUPATION:   PLOF: Independent  PATIENT GOALS: to improve pain and participate more in functional activities  NEXT MD VISIT: Not scheduled  OBJECTIVE:  Note: Objective measures were completed at Evaluation unless otherwise noted.  DIAGNOSTIC FINDINGS:  See imaging  PATIENT SURVEYS:  Modified ODI: 35/50  COGNITION: Overall cognitive status: Within functional limits for tasks assessed     SENSATION: Diminished @ L5-S1  MUSCLE LENGTH: NT  POSTURE: rounded shoulders and forward head  PALPATION: NT  LUMBAR ROM:   AROM eval  Flexion 25%  Extension 25%  Right lateral flexion 50%  Left lateral flexion 25%  Right rotation   Left rotation    (Blank rows = not tested)  LOWER EXTREMITY ROM:     Active  Right eval Left eval  Hip flexion    Hip extension    Hip abduction    Hip adduction    Hip internal rotation    Hip external rotation    Knee flexion    Knee extension    Ankle dorsiflexion    Ankle plantarflexion    Ankle inversion    Ankle eversion     (Blank rows = not tested)  LOWER EXTREMITY MMT:    MMT Right eval Left eval  Hip flexion 4 3+  Hip extension    Hip abduction    Hip adduction    Hip internal rotation    Hip external rotation    Knee flexion 5 5  Knee extension 5 5  Ankle dorsiflexion 5 4   Ankle plantarflexion 5 5  Ankle inversion    Ankle eversion     (Blank rows = not tested)  LUMBAR SPECIAL TESTS:  Straight leg raise test: Positive  FUNCTIONAL TESTS:  30 seconds chair stand test: 3  GAIT: Distance walked: 63ft Assistive device utilized: Single point cane Level of assistance: Modified independence Comments: R lateral shift, slow and antalgic  TREATMENT DATE: [facility] Adult PT Treatment:                                                DATE: 03/15/24 NuStep lvl 4 UE/LE x 4 min for functional activity tolerance Hooklying PPT x 10 - 5 hold Hooklying clamshell 2x10 GTB LTR x 5 Hooklying ball squeeze 2x10 SKTC 3x30  Bridge (small range) 2x5 Piriformis stretch x 30 ea  [facility] Adult PT Treatment:                                                DATE: 03/07/24 Therapeutic Exercise: Seated nerve glides LTRs x 10 PPTs x 10  Seated marches x 5 ea                              PATIENT EDUCATION:  Education details: Educated on eval findings, Performance of HEP, POC Person educated: Patient Education method: Programmer, multimedia, Facilities manager, and Handouts Education comprehension: verbalized understanding  HOME EXERCISE PROGRAM: Access Code: [email or web address] URL: [email or web address] Date: 03/07/2024 Prepared by: Alm Kingdom Exercises   Seated Sciatic Nerve Glide With Cervical Motion  - 1 x daily - 7 x weekly - 2-3 sets - 10 reps   Supine Lower Trunk Rotation  - 1 x daily - 7 x weekly - 2 sets - 10 reps   Supine Posterior Pelvic Tilt  - 1 x daily - 7 x weekly - 2 sets - 10 reps - 5 sec hold   Supine March  - 1 x daily - 7 x weekly - 2 sets - 10 reps   ASSESSMENT:  CLINICAL IMPRESSION: Pt tolerated treatment fair but continued to be limited by severe pain. Treatment focused core and LE strength as well as decreasing neural tension and improving muscle length. She is tolerating treatment better overall compared to July. Will continue to progress as able per POC.    EVAL: Patient is a 48 y.o. Female who was seen today for physical therapy evaluation and treatment for low back pain with left sided radicular pain. Pt was in high level pain coming in today, ambulating slowly with SPC. Evaluation findings are consistent with MD diagnosis and impression from imaging findings. Pt having significant numbness and tingling sensation down into L foot with lumbar movements, a positive SLR, and diminished sensation of L5 and S1 dermatomes.  Patient-reported outcomes indicate high level of disability with required activities of the pt and 30 sec STS score of only 3 reveal large impairment in functional tolerance, strength, and safety. The pt will benefit from skilled physical therapy for a several weeks to address deficits and assess response to therapy in an effort to reduce her symptoms  OBJECTIVE IMPAIRMENTS: Abnormal gait, decreased activity tolerance, decreased mobility, decreased strength, and pain.   ACTIVITY LIMITATIONS: carrying, lifting, bending, sitting, standing, squatting, sleeping, and stairs  PARTICIPATION LIMITATIONS: cleaning, laundry, driving, shopping, and community activity  PERSONAL FACTORS: Fitness, Past/current experiences, and 1-2 comorbidities: HTN and history of past lumbar surgeries are also affecting patient's functional outcome.   REHAB  POTENTIAL: Fair chronicity and severity of symptoms, past history of back surgeries  CLINICAL DECISION MAKING: Stable/uncomplicated  EVALUATION COMPLEXITY: Low   GOALS: Goals reviewed with patient? No  SHORT TERM GOALS: Target date: 03/29/2024  Patient will be independent with their HEP to promote self-management of their condition and support progression toward functional goals. Baseline: HEP given at eval  Goal status: INITIAL  2.  Patient will report a decrease in worst pain to 7/10 on the 0-10 scale to allow for improved participation in functional activities  Baseline: 10/10 Goal status:  INITIAL   LONG TERM GOALS: Target date: 05/02/2024  Pt will tolerate standing and seated activities for no less than 30 minutes for improved comfort and participation in daily activities  Baseline: Goal status: INITIAL  2.  Patient will report a decrease in worst pain to 5/10 and baseline to no more than 3/10 on the 0-10 scale to allow for improved participation in functional activities  Baseline:  Goal status: INITIAL  3.  Pt will improve all lumbar ROM to at least 75% of WNL for improved functional mobility and comfort with daily activities Baseline: see ROM chart Goal status: INITIAL  4.  Patient will improve performance on 30 sec STS from baseline of 3 to 8 to reflect improved functional mobility and show clinically significant change in status. Baseline: 3 Goal status: INITIAL  5.  Pt will improve score on Modified ODI from baseline of 35 to 22 to reflect significant change in perceived ability to perform functional tasks and demonstrate progress towards improving functional abilities Baseline: 35/50 Goal status: INITIAL    PLAN:  PT FREQUENCY: 2x/week  PT DURATION: 8 weeks  PLANNED INTERVENTIONS: 97110-Therapeutic exercises, 97530- Therapeutic activity, 97112- Neuromuscular re-education, 97535- Self Care, and 02859- Manual therapy.  PLAN FOR NEXT SESSION: Plan to assess response to HEP, review POC, continue and add exercises focused on lumbar mobility, continue with neural mobilizations.   Alm JAYSON Kingdom PT  03/16/24 8:45 AM

## 2024-03-15 NOTE — Therapy (Incomplete)
 OUTPATIENT PHYSICAL THERAPY THORACOLUMBAR TREATMENT   Patient Name: Suzanne [name] MRN: [id number] DOB:[birth year], 48 y.o., female Today's Date: 03/15/2024  END OF SESSION:    Past Medical History:  Diagnosis Date   Anemia    Diabetes mellitus without complication (HCC)    GERD (gastroesophageal reflux disease)    Headache    History of kidney stones    Hypertension    Sciatica    Past Surgical History:  Procedure Laterality Date   CERVICAL BIOPSY     CESAREAN SECTION N/A 05/15/2016   Procedure: CESAREAN SECTION;  Surgeon: Norleen LULLA Server, MD;  Location: [facility]IRTHING SUIT[facility];  Service: Obstetrics;  Laterality: N/A;   COLPOSCOPY     CYSTOSCOPY WITH RETROGRADE PYELOGRAM, URETEROSCOPY AND STENT PLACEMENT Left 05/03/2023   Procedure: CYSTOSCOPY WITH RETROGRADE PYELOGRAM, URETEROSCOPY AND STENT PLACEMENT;  Surgeon: Renda Glance, MD;  Location: [facility];  Service: Urology;  Laterality: Left;   EXTRACORPOREAL SHOCK WAVE LITHOTRIPSY Left 05/31/2023   Procedure: EXTRACORPOREAL SHOCK WAVE LITHOTRIPSY (ESWL);  Surgeon: Shona Layman BROCKS, MD;  Location: [facility];  Service: Urology;  Laterality: Left;   LUMBAR LAMINECTOMY/ DECOMPRESSION WITH MET-RX Left 10/19/2022   Procedure: Left Lumbar five-Sacral one Microlaminectomy and discectomy;  Surgeon: Cheryle Debby LABOR, MD;  Location: [facility];  Service: Neurosurgery;  Laterality: Left;   LUMBAR LAMINECTOMY/ DECOMPRESSION WITH MET-RX Left 04/27/2023   Procedure: Revision Left Minimally Invasive Lumbar Five-Sacral One Discectomy;  Surgeon: Cheryle Debby LABOR, MD;  Location: [facility];  Service: Neurosurgery;  Laterality: Left;  3C   Patient Active Problem List   Diagnosis Date Noted   Left ureteral stone 05/03/2023   Intractable pain 05/03/2023   Hypokalemia 05/03/2023   AKI (acute kidney injury) (HCC) 05/02/2023   Herniation of lumbar intervertebral disc with radiculopathy 04/27/2023   GERD (gastroesophageal reflux disease)  01/09/2016   Hypertension, benign 01/09/2016    PCP: Sim Emery CROME, MD  REFERRING PROVIDER: Sim Emery CROME, MD  REFERRING DIAG: M51.16 (ICD-10-CM) - Intervertebral disc disorders with radiculopathy, lumbar region   Rationale for Evaluation and Treatment: Rehabilitation  THERAPY DIAG:  No diagnosis found.  ONSET DATE: chronic  SUBJECTIVE:                                                                                                                                                                                           SUBJECTIVE STATEMENT:  Pt reports to physical therapy for evaluation and treatment of left sided LBP with L radicular pain that started April with no clear activity that lead to the onset. Pt states it can come and go, she has experienced similar pain  in the past which has lead to multiple back surgeries at the L5-S1 region. Received short term relief from surgery but it did not last long. Pt takes gabapentin  and tylenol  and gets some relief. Most mornings she wakes up in a lot of pain and has to take medication. Can walk longer distances when on medication. Pt reports she has to sleep on R side but still finds little comfort, not been sleeping well. She has a 65 year old child that she cares for  PERTINENT HISTORY:  HTN DM L5-S1 discectomy and laminectomy (2024)  PAIN:  Are you having pain?  Yes: NPRS scale: 5/10 current, 10/10 worst - takes ~ 20 min to come to baseline Pain location: back of thigh back, back of calf, 3-5 toes are numb Pain description: knife-like pain, deep, down back of leg Aggravating factors: sitting for long periods, bending over, standing for more than 15 minutes  Relieving factors: meds and rest (sometimes)  PRECAUTIONS: None  RED FLAGS: None   WEIGHT BEARING RESTRICTIONS: No  FALLS:  Has patient fallen in last 6 months? Yes. Number of falls 1, new shoes fell back wards   LIVING ENVIRONMENT: Lives with: lives with their  family Lives in: House/apartment Stairs:  Has following equipment at home: Single point cane  OCCUPATION:   PLOF: Independent  PATIENT GOALS: to improve pain and participate more in functional activities  NEXT MD VISIT: Not scheduled  OBJECTIVE:  Note: Objective measures were completed at Evaluation unless otherwise noted.  DIAGNOSTIC FINDINGS:  See imaging  PATIENT SURVEYS:  Modified ODI: 35/50  COGNITION: Overall cognitive status: Within functional limits for tasks assessed     SENSATION: Diminished @ L5-S1  MUSCLE LENGTH: NT  POSTURE: rounded shoulders and forward head  PALPATION: NT  LUMBAR ROM:   AROM eval  Flexion 25%  Extension 25%  Right lateral flexion 50%  Left lateral flexion 25%  Right rotation   Left rotation    (Blank rows = not tested)  LOWER EXTREMITY ROM:     Active  Right eval Left eval  Hip flexion    Hip extension    Hip abduction    Hip adduction    Hip internal rotation    Hip external rotation    Knee flexion    Knee extension    Ankle dorsiflexion    Ankle plantarflexion    Ankle inversion    Ankle eversion     (Blank rows = not tested)  LOWER EXTREMITY MMT:    MMT Right eval Left eval  Hip flexion 4 3+  Hip extension    Hip abduction    Hip adduction    Hip internal rotation    Hip external rotation    Knee flexion 5 5  Knee extension 5 5  Ankle dorsiflexion 5 4  Ankle plantarflexion 5 5  Ankle inversion    Ankle eversion     (Blank rows = not tested)  LUMBAR SPECIAL TESTS:  Straight leg raise test: Positive  FUNCTIONAL TESTS:  30 seconds chair stand test: 3  GAIT: Distance walked: 65ft Assistive device utilized: Single point cane Level of assistance: Modified independence Comments: R lateral shift, slow and antalgic  TREATMENT DATE:  [facility] Adult PT Treatment:  DATE: 03/15/24 Therapeutic Exercise: Seated nerve glides LTRs x 10 PPTs x 10  Seated  marches x 5 ea  [facility] Adult PT Treatment:                                                DATE: 03/07/24 Therapeutic Exercise: Seated nerve glides LTRs x 10 PPTs x 10  Seated marches x 5 ea                                                                                                                                   PATIENT EDUCATION:  Education details: Educated on eval findings, Performance of HEP, POC Person educated: Patient Education method: Programmer, multimedia, Facilities manager, and Handouts Education comprehension: verbalized understanding  HOME EXERCISE PROGRAM: Access Code: [email or web address] URL: [email or web address] Date: 03/07/2024 Prepared by: Alm Kingdom Exercises   Seated Sciatic Nerve Glide With Cervical Motion  - 1 x daily - 7 x weekly - 2-3 sets - 10 reps   Supine Lower Trunk Rotation  - 1 x daily - 7 x weekly - 2 sets - 10 reps   Supine Posterior Pelvic Tilt  - 1 x daily - 7 x weekly - 2 sets - 10 reps - 5 sec hold   Supine March  - 1 x daily - 7 x weekly - 2 sets - 10 reps   ASSESSMENT:  CLINICAL IMPRESSION: ***  Patient is a 48 y.o. Female who was seen today for physical therapy evaluation and treatment for low back pain with left sided radicular pain. Pt was in high level pain coming in today, ambulating slowly with SPC. Evaluation findings are consistent with MD diagnosis and impression from imaging findings. Pt having significant numbness and tingling sensation down into L foot with lumbar movements, a positive SLR, and diminished sensation of L5 and S1 dermatomes.  Patient-reported outcomes indicate high level of disability with required activities of the pt and 30 sec STS score of only 3 reveal large impairment in functional tolerance, strength, and safety. The pt will benefit from skilled physical therapy for a several weeks to address deficits and assess response to therapy in an effort to reduce her symptoms  OBJECTIVE IMPAIRMENTS: Abnormal gait, decreased  activity tolerance, decreased mobility, decreased strength, and pain.   ACTIVITY LIMITATIONS: carrying, lifting, bending, sitting, standing, squatting, sleeping, and stairs  PARTICIPATION LIMITATIONS: cleaning, laundry, driving, shopping, and community activity  PERSONAL FACTORS: Fitness, Past/current experiences, and 1-2 comorbidities: HTN and history of past lumbar surgeries are also affecting patient's functional outcome.   REHAB POTENTIAL: Fair chronicity and severity of symptoms, past history of back surgeries  CLINICAL DECISION MAKING: Stable/uncomplicated  EVALUATION COMPLEXITY: Low   GOALS: Goals reviewed with patient? No  SHORT TERM GOALS: Target date: 03/29/2024  Patient will be independent with their HEP  to promote self-management of their condition and support progression toward functional goals. Baseline: HEP given at eval  Goal status: INITIAL  2.  Patient will report a decrease in worst pain to 7/10 on the 0-10 scale to allow for improved participation in functional activities  Baseline: 10/10 Goal status: INITIAL   LONG TERM GOALS: Target date: 05/02/2024  Pt will tolerate standing and seated activities for no less than 30 minutes for improved comfort and participation in daily activities  Baseline: Goal status: INITIAL  2.  Patient will report a decrease in worst pain to 5/10 and baseline to no more than 3/10 on the 0-10 scale to allow for improved participation in functional activities  Baseline:  Goal status: INITIAL  3.  Pt will improve all lumbar ROM to at least 75% of WNL for improved functional mobility and comfort with daily activities Baseline: see ROM chart Goal status: INITIAL  4.  Patient will improve performance on 30 sec STS from baseline of 3 to 8 to reflect improved functional mobility and show clinically significant change in status. Baseline: 3 Goal status: INITIAL  5.  Pt will improve score on Modified ODI from baseline of 35 to 22  to reflect significant change in perceived ability to perform functional tasks and demonstrate progress towards improving functional abilities Baseline: 35/50 Goal status: INITIAL    PLAN:  PT FREQUENCY: 2x/week  PT DURATION: 8 weeks  PLANNED INTERVENTIONS: 97110-Therapeutic exercises, 97530- Therapeutic activity, 97112- Neuromuscular re-education, 97535- Self Care, and 02859- Manual therapy.  PLAN FOR NEXT SESSION: Plan to assess response to HEP, review POC, continue and add exercises focused on lumbar mobility, continue with neural mobilizations.   ***

## 2024-03-20 ENCOUNTER — Ambulatory Visit

## 2024-03-20 NOTE — Therapy (Incomplete)
 OUTPATIENT PHYSICAL THERAPY TREATMENT   Patient Name: Suzanne [name] MRN: [id number] DOB:1976-07-02, 48 y.o., female Today's Date: 03/20/2024  END OF SESSION:     Past Medical History:  Diagnosis Date   Anemia    Diabetes mellitus without complication (HCC)    GERD (gastroesophageal reflux disease)    Headache    History of kidney stones    Hypertension    Sciatica    Past Surgical History:  Procedure Laterality Date   CERVICAL BIOPSY     CESAREAN SECTION N/A 05/15/2016   Procedure: CESAREAN SECTION;  Surgeon: Norleen LULLA Server, MD;  Location: [facility] BIRTHING SUITES;  Service: Obstetrics;  Laterality: N/A;   COLPOSCOPY     CYSTOSCOPY WITH RETROGRADE PYELOGRAM, URETEROSCOPY AND STENT PLACEMENT Left 05/03/2023   Procedure: CYSTOSCOPY WITH RETROGRADE PYELOGRAM, URETEROSCOPY AND STENT PLACEMENT;  Surgeon: Renda Glance, MD;  Location: [facility];  Service: Urology;  Laterality: Left;   EXTRACORPOREAL SHOCK WAVE LITHOTRIPSY Left 05/31/2023   Procedure: EXTRACORPOREAL SHOCK WAVE LITHOTRIPSY (ESWL);  Surgeon: Shona Layman BROCKS, MD;  Location: [facility];  Service: Urology;  Laterality: Left;   LUMBAR LAMINECTOMY/ DECOMPRESSION WITH MET-RX Left 10/19/2022   Procedure: Left Lumbar five-Sacral one Microlaminectomy and discectomy;  Surgeon: Cheryle Debby LABOR, MD;  Location: [facility];  Service: Neurosurgery;  Laterality: Left;   LUMBAR LAMINECTOMY/ DECOMPRESSION WITH MET-RX Left 04/27/2023   Procedure: Revision Left Minimally Invasive Lumbar Five-Sacral One Discectomy;  Surgeon: Cheryle Debby LABOR, MD;  Location: [facility];  Service: Neurosurgery;  Laterality: Left;  3C   Patient Active Problem List   Diagnosis Date Noted   Left ureteral stone 05/03/2023   Intractable pain 05/03/2023   Hypokalemia 05/03/2023   AKI (acute kidney injury) (HCC) 05/02/2023   Herniation of lumbar intervertebral disc with radiculopathy 04/27/2023   GERD (gastroesophageal reflux disease) 01/09/2016    Hypertension, benign 01/09/2016    PCP: Sim Emery CROME, MD  REFERRING PROVIDER: Sim Emery CROME, MD  REFERRING DIAG: M51.16 (ICD-10-CM) - Intervertebral disc disorders with radiculopathy, lumbar region   Rationale for Evaluation and Treatment: Rehabilitation  THERAPY DIAG:  No diagnosis found.  ONSET DATE: chronic  SUBJECTIVE:                                                                                                                                                                                           SUBJECTIVE STATEMENT: ***  EVAL: Pt reports to physical therapy for evaluation and treatment of left sided LBP with L radicular pain that started April with no clear activity that lead to the onset. Pt states it can come and go, she has experienced  similar pain in the past which has lead to multiple back surgeries at the L5-S1 region. Received short term relief from surgery but it did not last long. Pt takes gabapentin  and tylenol  and gets some relief. Most mornings she wakes up in a lot of pain and has to take medication. Can walk longer distances when on medication. Pt reports she has to sleep on R side but still finds little comfort, not been sleeping well. She has a 2 year old child that she cares for  PERTINENT HISTORY:  HTN DM L5-S1 discectomy and laminectomy (2024)  PAIN:  Are you having pain?  Yes: NPRS scale: 5/10 current, 10/10 worst - takes ~ 20 min to come to baseline Pain location: back of thigh back, back of calf, 3-5 toes are numb Pain description: knife-like pain, deep, down back of leg Aggravating factors: sitting for long periods, bending over, standing for more than 15 minutes  Relieving factors: meds and rest (sometimes)  PRECAUTIONS: None  RED FLAGS: None   WEIGHT BEARING RESTRICTIONS: No  FALLS:  Has patient fallen in last 6 months? Yes. Number of falls 1, new shoes fell back wards   LIVING ENVIRONMENT: Lives with: lives with their  family Lives in: House/apartment Stairs:  Has following equipment at home: Single point cane  OCCUPATION:   PLOF: Independent  PATIENT GOALS: to improve pain and participate more in functional activities  NEXT MD VISIT: Not scheduled  OBJECTIVE:  Note: Objective measures were completed at Evaluation unless otherwise noted.  DIAGNOSTIC FINDINGS:  See imaging  PATIENT SURVEYS:  Modified ODI: 35/50  COGNITION: Overall cognitive status: Within functional limits for tasks assessed     SENSATION: Diminished @ L5-S1  MUSCLE LENGTH: NT  POSTURE: rounded shoulders and forward head  PALPATION: NT  LUMBAR ROM:   AROM eval  Flexion 25%  Extension 25%  Right lateral flexion 50%  Left lateral flexion 25%  Right rotation   Left rotation    (Blank rows = not tested)  LOWER EXTREMITY ROM:     Active  Right eval Left eval  Hip flexion    Hip extension    Hip abduction    Hip adduction    Hip internal rotation    Hip external rotation    Knee flexion    Knee extension    Ankle dorsiflexion    Ankle plantarflexion    Ankle inversion    Ankle eversion     (Blank rows = not tested)  LOWER EXTREMITY MMT:    MMT Right eval Left eval  Hip flexion 4 3+  Hip extension    Hip abduction    Hip adduction    Hip internal rotation    Hip external rotation    Knee flexion 5 5  Knee extension 5 5  Ankle dorsiflexion 5 4  Ankle plantarflexion 5 5  Ankle inversion    Ankle eversion     (Blank rows = not tested)  LUMBAR SPECIAL TESTS:  Straight leg raise test: Positive  FUNCTIONAL TESTS:  30 seconds chair stand test: 3  GAIT: Distance walked: 41ft Assistive device utilized: Single point cane Level of assistance: Modified independence Comments: R lateral shift, slow and antalgic  TREATMENT DATE: [facility] Adult PT Treatment:  DATE: 03/20/24 NuStep lvl 4 UE/LE x 4 min for functional activity tolerance Hooklying PPT x  10 - 5 hold Hooklying clamshell 2x10 GTB LTR x 5 Hooklying ball squeeze 2x10 SKTC 3x30  Bridge (small range) 2x5 Piriformis stretch x 30 ea  [facility] Adult PT Treatment:                                                DATE: 03/15/24 NuStep lvl 4 UE/LE x 4 min for functional activity tolerance Hooklying PPT x 10 - 5 hold Hooklying clamshell 2x10 GTB LTR x 5 Hooklying ball squeeze 2x10 SKTC 3x30  Bridge (small range) 2x5 Piriformis stretch x 30 ea  [facility] Adult PT Treatment:                                                DATE: 03/07/24 Therapeutic Exercise: Seated nerve glides LTRs x 10 PPTs x 10  Seated marches x 5 ea                              PATIENT EDUCATION:  Education details: Educated on eval findings, Performance of HEP, POC Person educated: Patient Education method: Programmer, multimedia, Facilities manager, and Handouts Education comprehension: verbalized understanding  HOME EXERCISE PROGRAM: Access Code: [email or web address] URL: [email or web address] Date: 03/07/2024 Prepared by: Alm Kingdom Exercises   Seated Sciatic Nerve Glide With Cervical Motion  - 1 x daily - 7 x weekly - 2-3 sets - 10 reps   Supine Lower Trunk Rotation  - 1 x daily - 7 x weekly - 2 sets - 10 reps   Supine Posterior Pelvic Tilt  - 1 x daily - 7 x weekly - 2 sets - 10 reps - 5 sec hold   Supine March  - 1 x daily - 7 x weekly - 2 sets - 10 reps   ASSESSMENT:  CLINICAL IMPRESSION: ***  EVAL: Patient is a 48 y.o. Female who was seen today for physical therapy evaluation and treatment for low back pain with left sided radicular pain. Pt was in high level pain coming in today, ambulating slowly with SPC. Evaluation findings are consistent with MD diagnosis and impression from imaging findings. Pt having significant numbness and tingling sensation down into L foot with lumbar movements, a positive SLR, and diminished sensation of L5 and S1 dermatomes.  Patient-reported outcomes indicate high level of  disability with required activities of the pt and 30 sec STS score of only 3 reveal large impairment in functional tolerance, strength, and safety. The pt will benefit from skilled physical therapy for a several weeks to address deficits and assess response to therapy in an effort to reduce her symptoms  OBJECTIVE IMPAIRMENTS: Abnormal gait, decreased activity tolerance, decreased mobility, decreased strength, and pain.   ACTIVITY LIMITATIONS: carrying, lifting, bending, sitting, standing, squatting, sleeping, and stairs  PARTICIPATION LIMITATIONS: cleaning, laundry, driving, shopping, and community activity  PERSONAL FACTORS: Fitness, Past/current experiences, and 1-2 comorbidities: HTN and history of past lumbar surgeries are also affecting patient's functional outcome.   REHAB POTENTIAL: Fair chronicity and severity of symptoms, past history of back surgeries  CLINICAL DECISION MAKING: Stable/uncomplicated  EVALUATION COMPLEXITY: Low   GOALS: Goals  reviewed with patient? No  SHORT TERM GOALS: Target date: 03/29/2024  Patient will be independent with their HEP to promote self-management of their condition and support progression toward functional goals. Baseline: HEP given at eval  Goal status: INITIAL  2.  Patient will report a decrease in worst pain to 7/10 on the 0-10 scale to allow for improved participation in functional activities  Baseline: 10/10 Goal status: INITIAL   LONG TERM GOALS: Target date: 05/02/2024  Pt will tolerate standing and seated activities for no less than 30 minutes for improved comfort and participation in daily activities  Baseline: Goal status: INITIAL  2.  Patient will report a decrease in worst pain to 5/10 and baseline to no more than 3/10 on the 0-10 scale to allow for improved participation in functional activities  Baseline:  Goal status: INITIAL  3.  Pt will improve all lumbar ROM to at least 75% of WNL for improved functional mobility  and comfort with daily activities Baseline: see ROM chart Goal status: INITIAL  4.  Patient will improve performance on 30 sec STS from baseline of 3 to 8 to reflect improved functional mobility and show clinically significant change in status. Baseline: 3 Goal status: INITIAL  5.  Pt will improve score on Modified ODI from baseline of 35 to 22 to reflect significant change in perceived ability to perform functional tasks and demonstrate progress towards improving functional abilities Baseline: 35/50 Goal status: INITIAL    PLAN:  PT FREQUENCY: 2x/week  PT DURATION: 8 weeks  PLANNED INTERVENTIONS: 97110-Therapeutic exercises, 97530- Therapeutic activity, 97112- Neuromuscular re-education, 97535- Self Care, and 02859- Manual therapy.  PLAN FOR NEXT SESSION: Plan to assess response to HEP, review POC, continue and add exercises focused on lumbar mobility, continue with neural mobilizations.   Alm JAYSON Kingdom PT  03/20/24 2:17 PM

## 2024-03-22 ENCOUNTER — Ambulatory Visit

## 2024-03-22 DIAGNOSIS — M5459 Other low back pain: Secondary | ICD-10-CM | POA: Diagnosis not present

## 2024-03-22 DIAGNOSIS — M6281 Muscle weakness (generalized): Secondary | ICD-10-CM

## 2024-03-22 DIAGNOSIS — R2689 Other abnormalities of gait and mobility: Secondary | ICD-10-CM

## 2024-03-22 NOTE — Therapy (Signed)
 OUTPATIENT PHYSICAL THERAPY TREATMENT   Patient Name: Suzanne [name] MRN: [id number] DOB:1975-09-03, 48 y.o., female Today's Date: 03/23/2024  END OF SESSION:  PT End of Session - 03/22/24 1510     Visit Number 3    Number of Visits 17    Date for PT Re-Evaluation 05/02/24    Authorization Type [employer] MCD    PT Start Time 1525    PT Stop Time 1605    PT Time Calculation (min) 40 min    Activity Tolerance Patient limited by pain    Behavior During Therapy WFL for tasks assessed/performed            Past Medical History:  Diagnosis Date   Anemia    Diabetes mellitus without complication (HCC)    GERD (gastroesophageal reflux disease)    Headache    History of kidney stones    Hypertension    Sciatica    Past Surgical History:  Procedure Laterality Date   CERVICAL BIOPSY     CESAREAN SECTION N/A 05/15/2016   Procedure: CESAREAN SECTION;  Surgeon: Norleen LULLA Server, MD;  Location: [facility];  Service: Obstetrics;  Laterality: N/A;   COLPOSCOPY     CYSTOSCOPY WITH RETROGRADE PYELOGRAM, URETEROSCOPY AND STENT PLACEMENT Left 05/03/2023   Procedure: CYSTOSCOPY WITH RETROGRADE PYELOGRAM, URETEROSCOPY AND STENT PLACEMENT;  Surgeon: Renda Glance, MD;  Location: [facility];  Service: Urology;  Laterality: Left;   EXTRACORPOREAL SHOCK WAVE LITHOTRIPSY Left 05/31/2023   Procedure: EXTRACORPOREAL SHOCK WAVE LITHOTRIPSY (ESWL);  Surgeon: Shona Layman BROCKS, MD;  Location: [facility];  Service: Urology;  Laterality: Left;   LUMBAR LAMINECTOMY/ DECOMPRESSION WITH MET-RX Left 10/19/2022   Procedure: Left Lumbar five-Sacral one Microlaminectomy and discectomy;  Surgeon: Cheryle Debby LABOR, MD;  Location: [facility];  Service: Neurosurgery;  Laterality: Left;   LUMBAR LAMINECTOMY/ DECOMPRESSION WITH MET-RX Left 04/27/2023   Procedure: Revision Left Minimally Invasive Lumbar Five-Sacral One Discectomy;  Surgeon: Cheryle Debby LABOR, MD;  Location: [facility];  Service:  Neurosurgery;  Laterality: Left;  3C   Patient Active Problem List   Diagnosis Date Noted   Left ureteral stone 05/03/2023   Intractable pain 05/03/2023   Hypokalemia 05/03/2023   AKI (acute kidney injury) (HCC) 05/02/2023   Herniation of lumbar intervertebral disc with radiculopathy 04/27/2023   GERD (gastroesophageal reflux disease) 01/09/2016   Hypertension, benign 01/09/2016    PCP: Sim Emery CROME, MD  REFERRING PROVIDER: Sim Emery CROME, MD  REFERRING DIAG: M51.16 (ICD-10-CM) - Intervertebral disc disorders with radiculopathy, lumbar region   Rationale for Evaluation and Treatment: Rehabilitation  THERAPY DIAG:  Other low back pain  Muscle weakness (generalized)  Other abnormalities of gait and mobility  ONSET DATE: chronic  SUBJECTIVE:  SUBJECTIVE STATEMENT: Pt presents to PT with reports of 5/10 pain. Has been compliant with HEP.   EVAL: Pt reports to physical therapy for evaluation and treatment of left sided LBP with L radicular pain that started April with no clear activity that lead to the onset. Pt states it can come and go, she has experienced similar pain in the past which has lead to multiple back surgeries at the L5-S1 region. Received short term relief from surgery but it did not last long. Pt takes gabapentin  and tylenol  and gets some relief. Most mornings she wakes up in a lot of pain and has to take medication. Can walk longer distances when on medication. Pt reports she has to sleep on R side but still finds little comfort, not been sleeping well. She has a 75 year old child that she cares for  PERTINENT HISTORY:  HTN DM L5-S1 discectomy and laminectomy (2024)  PAIN:  Are you having pain?  Yes: NPRS scale: 5/10 current, 10/10 worst - takes ~ 20 min to come to  baseline Pain location: back of thigh back, back of calf, 3-5 toes are numb Pain description: knife-like pain, deep, down back of leg Aggravating factors: sitting for long periods, bending over, standing for more than 15 minutes  Relieving factors: meds and rest (sometimes)  PRECAUTIONS: None  RED FLAGS: None   WEIGHT BEARING RESTRICTIONS: No  FALLS:  Has patient fallen in last 6 months? Yes. Number of falls 1, new shoes fell back wards   LIVING ENVIRONMENT: Lives with: lives with their family Lives in: House/apartment Stairs:  Has following equipment at home: Single point cane  OCCUPATION:   PLOF: Independent  PATIENT GOALS: to improve pain and participate more in functional activities  NEXT MD VISIT: Not scheduled  OBJECTIVE:  Note: Objective measures were completed at Evaluation unless otherwise noted.  DIAGNOSTIC FINDINGS:  See imaging  PATIENT SURVEYS:  Modified ODI: 35/50  COGNITION: Overall cognitive status: Within functional limits for tasks assessed     SENSATION: Diminished @ L5-S1  MUSCLE LENGTH: NT  POSTURE: rounded shoulders and forward head  PALPATION: NT  LUMBAR ROM:   AROM eval  Flexion 25%  Extension 25%  Right lateral flexion 50%  Left lateral flexion 25%  Right rotation   Left rotation    (Blank rows = not tested)  LOWER EXTREMITY ROM:     Active  Right eval Left eval  Hip flexion    Hip extension    Hip abduction    Hip adduction    Hip internal rotation    Hip external rotation    Knee flexion    Knee extension    Ankle dorsiflexion    Ankle plantarflexion    Ankle inversion    Ankle eversion     (Blank rows = not tested)  LOWER EXTREMITY MMT:    MMT Right eval Left eval  Hip flexion 4 3+  Hip extension    Hip abduction    Hip adduction    Hip internal rotation    Hip external rotation    Knee flexion 5 5  Knee extension 5 5  Ankle dorsiflexion 5 4  Ankle plantarflexion 5 5  Ankle inversion     Ankle eversion     (Blank rows = not tested)  LUMBAR SPECIAL TESTS:  Straight leg raise test: Positive  FUNCTIONAL TESTS:  30 seconds chair stand test: 3  GAIT: Distance walked: 25ft Assistive device utilized: Single point cane Level of  assistance: Modified independence Comments: R lateral shift, slow and antalgic  TREATMENT DATE: [facility] Adult PT Treatment:                                                DATE: 03/22/24 NuStep lvl 4 UE/LE x 4 min for functional activity tolerance Hooklying PPT x 10 - 5 hold Hooklying PPT with ball 2x10 Hooklying clamshell 2x10 GTB - increased pain LTR x 5 - R only  Manual Therapy:  Lumbar traction on physioball  TPR to L piriformis   [facility] Adult PT Treatment:                                                DATE: 03/15/24 NuStep lvl 4 UE/LE x 4 min for functional activity tolerance Hooklying PPT x 10 - 5 hold Hooklying clamshell 2x10 GTB LTR x 5 Hooklying ball squeeze 2x10 SKTC 3x30  Bridge (small range) 2x5 Piriformis stretch x 30 ea  [facility] Adult PT Treatment:                                                DATE: 03/07/24 Therapeutic Exercise: Seated nerve glides LTRs x 10 PPTs x 10  Seated marches x 5 ea                              PATIENT EDUCATION:  Education details: Educated on eval findings, Performance of HEP, POC Person educated: Patient Education method: Programmer, multimedia, Facilities manager, and Handouts Education comprehension: verbalized understanding  HOME EXERCISE PROGRAM: Access Code: [email or web address] URL: [email or web address] Date: 03/07/2024 Prepared by: Alm Kingdom Exercises   Seated Sciatic Nerve Glide With Cervical Motion  - 1 x daily - 7 x weekly - 2-3 sets - 10 reps   Supine Lower Trunk Rotation  - 1 x daily - 7 x weekly - 2 sets - 10 reps   Supine Posterior Pelvic Tilt  - 1 x daily - 7 x weekly - 2 sets - 10 reps - 5 sec hold   Supine March  - 1 x daily - 7 x weekly - 2 sets - 10 reps    ASSESSMENT:  CLINICAL IMPRESSION: Pt tolerated treatment fair but was limited due to increased pain. Did respond well to manual treatment and noted slight decrease in discomfort post session.  We will continue to see how she progresses and tolerates treatment with core and hip strengthening.   EVAL: Patient is a 48 y.o. Female who was seen today for physical therapy evaluation and treatment for low back pain with left sided radicular pain. Pt was in high level pain coming in today, ambulating slowly with SPC. Evaluation findings are consistent with MD diagnosis and impression from imaging findings. Pt having significant numbness and tingling sensation down into L foot with lumbar movements, a positive SLR, and diminished sensation of L5 and S1 dermatomes.  Patient-reported outcomes indicate high level of disability with required activities of the pt and 30 sec STS score of only 3 reveal large impairment in functional tolerance, strength, and safety.  The pt will benefit from skilled physical therapy for a several weeks to address deficits and assess response to therapy in an effort to reduce her symptoms  OBJECTIVE IMPAIRMENTS: Abnormal gait, decreased activity tolerance, decreased mobility, decreased strength, and pain.   ACTIVITY LIMITATIONS: carrying, lifting, bending, sitting, standing, squatting, sleeping, and stairs  PARTICIPATION LIMITATIONS: cleaning, laundry, driving, shopping, and community activity  PERSONAL FACTORS: Fitness, Past/current experiences, and 1-2 comorbidities: HTN and history of past lumbar surgeries are also affecting patient's functional outcome.   REHAB POTENTIAL: Fair chronicity and severity of symptoms, past history of back surgeries  CLINICAL DECISION MAKING: Stable/uncomplicated  EVALUATION COMPLEXITY: Low   GOALS: Goals reviewed with patient? No  SHORT TERM GOALS: Target date: 03/29/2024  Patient will be independent with their HEP to promote  self-management of their condition and support progression toward functional goals. Baseline: HEP given at eval  Goal status: INITIAL  2.  Patient will report a decrease in worst pain to 7/10 on the 0-10 scale to allow for improved participation in functional activities  Baseline: 10/10 Goal status: INITIAL   LONG TERM GOALS: Target date: 05/02/2024  Pt will tolerate standing and seated activities for no less than 30 minutes for improved comfort and participation in daily activities  Baseline: Goal status: INITIAL  2.  Patient will report a decrease in worst pain to 5/10 and baseline to no more than 3/10 on the 0-10 scale to allow for improved participation in functional activities  Baseline:  Goal status: INITIAL  3.  Pt will improve all lumbar ROM to at least 75% of WNL for improved functional mobility and comfort with daily activities Baseline: see ROM chart Goal status: INITIAL  4.  Patient will improve performance on 30 sec STS from baseline of 3 to 8 to reflect improved functional mobility and show clinically significant change in status. Baseline: 3 Goal status: INITIAL  5.  Pt will improve score on Modified ODI from baseline of 35 to 22 to reflect significant change in perceived ability to perform functional tasks and demonstrate progress towards improving functional abilities Baseline: 35/50 Goal status: INITIAL    PLAN:  PT FREQUENCY: 2x/week  PT DURATION: 8 weeks  PLANNED INTERVENTIONS: 97110-Therapeutic exercises, 97530- Therapeutic activity, 97112- Neuromuscular re-education, 97535- Self Care, and 02859- Manual therapy.  PLAN FOR NEXT SESSION: Plan to assess response to HEP, review POC, continue and add exercises focused on lumbar mobility, continue with neural mobilizations.   Alm JAYSON Kingdom PT  03/23/24 10:45 AM

## 2024-03-29 ENCOUNTER — Ambulatory Visit: Admitting: Physical Therapy

## 2024-03-29 ENCOUNTER — Ambulatory Visit

## 2024-04-03 ENCOUNTER — Encounter

## 2024-04-05 ENCOUNTER — Encounter

## 2024-04-05 ENCOUNTER — Encounter: Payer: Self-pay | Admitting: Physical Therapy

## 2024-04-05 ENCOUNTER — Ambulatory Visit: Attending: Neurosurgery | Admitting: Physical Therapy

## 2024-04-05 DIAGNOSIS — M5459 Other low back pain: Secondary | ICD-10-CM | POA: Diagnosis present

## 2024-04-05 DIAGNOSIS — M6281 Muscle weakness (generalized): Secondary | ICD-10-CM | POA: Insufficient documentation

## 2024-04-05 DIAGNOSIS — R2689 Other abnormalities of gait and mobility: Secondary | ICD-10-CM | POA: Insufficient documentation

## 2024-04-05 NOTE — Therapy (Signed)
 OUTPATIENT PHYSICAL THERAPY TREATMENT   Patient Name: Suzanne [name] MRN: [id number] DOB:10-17-1975, 48 y.o., female Today's Date: 04/05/2024  END OF SESSION:  PT End of Session - 04/05/24 1819     Visit Number 4    Number of Visits 17    Date for PT Re-Evaluation 05/02/24    Authorization Type [employer] MCD    PT Start Time 1745    PT Stop Time 1823    PT Time Calculation (min) 38 min    Activity Tolerance Patient limited by pain    Behavior During Therapy WFL for tasks assessed/performed             Past Medical History:  Diagnosis Date   Anemia    Diabetes mellitus without complication (HCC)    GERD (gastroesophageal reflux disease)    Headache    History of kidney stones    Hypertension    Sciatica    Past Surgical History:  Procedure Laterality Date   CERVICAL BIOPSY     CESAREAN SECTION N/A 05/15/2016   Procedure: CESAREAN SECTION;  Surgeon: Norleen LULLA Server, MD;  Location: [facility];  Service: Obstetrics;  Laterality: N/A;   COLPOSCOPY     CYSTOSCOPY WITH RETROGRADE PYELOGRAM, URETEROSCOPY AND STENT PLACEMENT Left 05/03/2023   Procedure: CYSTOSCOPY WITH RETROGRADE PYELOGRAM, URETEROSCOPY AND STENT PLACEMENT;  Surgeon: Renda Glance, MD;  Location: [facility];  Service: Urology;  Laterality: Left;   EXTRACORPOREAL SHOCK WAVE LITHOTRIPSY Left 05/31/2023   Procedure: EXTRACORPOREAL SHOCK WAVE LITHOTRIPSY (ESWL);  Surgeon: Shona Layman BROCKS, MD;  Location: [facility];  Service: Urology;  Laterality: Left;   LUMBAR LAMINECTOMY/ DECOMPRESSION WITH MET-RX Left 10/19/2022   Procedure: Left Lumbar five-Sacral one Microlaminectomy and discectomy;  Surgeon: Cheryle Debby LABOR, MD;  Location: [facility];  Service: Neurosurgery;  Laterality: Left;   LUMBAR LAMINECTOMY/ DECOMPRESSION WITH MET-RX Left 04/27/2023   Procedure: Revision Left Minimally Invasive Lumbar Five-Sacral One Discectomy;  Surgeon: Cheryle Debby LABOR, MD;  Location: [facility];  Service:  Neurosurgery;  Laterality: Left;  3C   Patient Active Problem List   Diagnosis Date Noted   Left ureteral stone 05/03/2023   Intractable pain 05/03/2023   Hypokalemia 05/03/2023   AKI (acute kidney injury) (HCC) 05/02/2023   Herniation of lumbar intervertebral disc with radiculopathy 04/27/2023   GERD (gastroesophageal reflux disease) 01/09/2016   Hypertension, benign 01/09/2016    PCP: Sim Emery CROME, MD  REFERRING PROVIDER: Sim Emery CROME, MD  REFERRING DIAG: M51.16 (ICD-10-CM) - Intervertebral disc disorders with radiculopathy, lumbar region   Rationale for Evaluation and Treatment: Rehabilitation  THERAPY DIAG:  Other low back pain  Muscle weakness (generalized)  Other abnormalities of gait and mobility  ONSET DATE: chronic  SUBJECTIVE:  SUBJECTIVE STATEMENT: Pt attended today's session with reports of 10/10 pain. Pt stated that they have maintained good compliance with current HEP.  Hasn't been able to take pain medications because she's been responsible for driving family members around   EVAL: Pt reports to physical therapy for evaluation and treatment of left sided LBP with L radicular pain that started April with no clear activity that lead to the onset. Pt states it can come and go, she has experienced similar pain in the past which has lead to multiple back surgeries at the L5-S1 region. Received short term relief from surgery but it did not last long. Pt takes gabapentin  and tylenol  and gets some relief. Most mornings she wakes up in a lot of pain and has to take medication. Can walk longer distances when on medication. Pt reports she has to sleep on R side but still finds little comfort, not been sleeping well. She has a 68 year old child that she cares for  PERTINENT HISTORY:   HTN DM L5-S1 discectomy and laminectomy (2024)  PAIN:  Are you having pain?  Yes: NPRS scale: 5/10 current, 10/10 worst - takes ~ 20 min to come to baseline Pain location: back of thigh back, back of calf, 3-5 toes are numb Pain description: knife-like pain, deep, down back of leg Aggravating factors: sitting for long periods, bending over, standing for more than 15 minutes  Relieving factors: meds and rest (sometimes)  PRECAUTIONS: None  RED FLAGS: None   WEIGHT BEARING RESTRICTIONS: No  FALLS:  Has patient fallen in last 6 months? Yes. Number of falls 1, new shoes fell back wards   LIVING ENVIRONMENT: Lives with: lives with their family Lives in: House/apartment Stairs:  Has following equipment at home: Single point cane  OCCUPATION:   PLOF: Independent  PATIENT GOALS: to improve pain and participate more in functional activities  NEXT MD VISIT: Not scheduled  OBJECTIVE:  Note: Objective measures were completed at Evaluation unless otherwise noted.  DIAGNOSTIC FINDINGS:  See imaging  PATIENT SURVEYS:  Modified ODI: 35/50  COGNITION: Overall cognitive status: Within functional limits for tasks assessed     SENSATION: Diminished @ L5-S1  MUSCLE LENGTH: NT  POSTURE: rounded shoulders and forward head  PALPATION: NT  LUMBAR ROM:   AROM eval  Flexion 25%  Extension 25%  Right lateral flexion 50%  Left lateral flexion 25%  Right rotation   Left rotation    (Blank rows = not tested)  LOWER EXTREMITY ROM:     Active  Right eval Left eval  Hip flexion    Hip extension    Hip abduction    Hip adduction    Hip internal rotation    Hip external rotation    Knee flexion    Knee extension    Ankle dorsiflexion    Ankle plantarflexion    Ankle inversion    Ankle eversion     (Blank rows = not tested)  LOWER EXTREMITY MMT:    MMT Right eval Left eval  Hip flexion 4 3+  Hip extension    Hip abduction    Hip adduction    Hip internal  rotation    Hip external rotation    Knee flexion 5 5  Knee extension 5 5  Ankle dorsiflexion 5 4  Ankle plantarflexion 5 5  Ankle inversion    Ankle eversion     (Blank rows = not tested)  LUMBAR SPECIAL TESTS:  Straight leg raise test: Positive  FUNCTIONAL TESTS:  30 seconds chair stand test: 3  GAIT: Distance walked: 84ft Assistive device utilized: Single point cane Level of assistance: Modified independence Comments: R lateral shift, slow and antalgic  TREATMENT DATE: [facility] Adult PT Treatment:                                                DATE: 04/05/2024  Therapeutic Exercise: Supine SI join mobilization with dowel rod 2x10, 5s hold Passive IR/ER of L hip Gr I-IV L hip distraction Neuromuscular re-ed: Small range supine bridge 2x12, hold 2s Supine Sciatic glide 2x15   [facility] Adult PT Treatment:                                                DATE: 03/22/24 NuStep lvl 4 UE/LE x 4 min for functional activity tolerance Hooklying PPT x 10 - 5 hold Hooklying PPT with ball 2x10 Hooklying clamshell 2x10 GTB - increased pain LTR x 5 - R only  Manual Therapy:  Lumbar traction on physioball  TPR to L piriformis   [facility] Adult PT Treatment:                                                DATE: 03/15/24 NuStep lvl 4 UE/LE x 4 min for functional activity tolerance Hooklying PPT x 10 - 5 hold Hooklying clamshell 2x10 GTB LTR x 5 Hooklying ball squeeze 2x10 SKTC 3x30  Bridge (small range) 2x5 Piriformis stretch x 30 ea                               PATIENT EDUCATION:  Education details: Educated on eval findings, Performance of HEP, POC Person educated: Patient Education method: Programmer, multimedia, Facilities manager, and Handouts Education comprehension: verbalized understanding  HOME EXERCISE PROGRAM: Access Code: [email or web address] URL: [email or web address] Date: 03/07/2024 Prepared by: Alm Kingdom Exercises   Seated Sciatic Nerve Glide With Cervical Motion  - 1 x  daily - 7 x weekly - 2-3 sets - 10 reps   Supine Lower Trunk Rotation  - 1 x daily - 7 x weekly - 2 sets - 10 reps   Supine Posterior Pelvic Tilt  - 1 x daily - 7 x weekly - 2 sets - 10 reps - 5 sec hold   Supine March  - 1 x daily - 7 x weekly - 2 sets - 10 reps   ASSESSMENT:  CLINICAL IMPRESSION: Pt attended physical therapy session for continuation of treatment regarding LBP. Today's treatment focused on improvement of  SI joint stability, proximal hip strengthening and pain management. Pt has beared responsibility for driivng family members around which limits how often she can take pain meidcation, presenting with 10/10 pain today. Pt showed fair tolerance to administered treatment with no adverse effects by the end of session. Skilled intervention was utilized via activity modification for pt tolerance with task completion, functional progression/regression promoting best outcomes inline with current rehab goals, as well as minimal verbal/tactile cuing alongside no physical assistance for safe and appropriate performance of today's activities. Pt  continues to present with SI J dysfunction and neurological changes along l5/s1 dermatomal pattern.   EVAL: Patient is a 48 y.o. Female who was seen today for physical therapy evaluation and treatment for low back pain with left sided radicular pain. Pt was in high level pain coming in today, ambulating slowly with SPC. Evaluation findings are consistent with MD diagnosis and impression from imaging findings. Pt having significant numbness and tingling sensation down into L foot with lumbar movements, a positive SLR, and diminished sensation of L5 and S1 dermatomes.  Patient-reported outcomes indicate high level of disability with required activities of the pt and 30 sec STS score of only 3 reveal large impairment in functional tolerance, strength, and safety. The pt will benefit from skilled physical therapy for a several weeks to address deficits and  assess response to therapy in an effort to reduce her symptoms  OBJECTIVE IMPAIRMENTS: Abnormal gait, decreased activity tolerance, decreased mobility, decreased strength, and pain.   ACTIVITY LIMITATIONS: carrying, lifting, bending, sitting, standing, squatting, sleeping, and stairs  PARTICIPATION LIMITATIONS: cleaning, laundry, driving, shopping, and community activity  PERSONAL FACTORS: Fitness, Past/current experiences, and 1-2 comorbidities: HTN and history of past lumbar surgeries are also affecting patient's functional outcome.   REHAB POTENTIAL: Fair chronicity and severity of symptoms, past history of back surgeries  CLINICAL DECISION MAKING: Stable/uncomplicated  EVALUATION COMPLEXITY: Low   GOALS: Goals reviewed with patient? No  SHORT TERM GOALS: Target date: 03/29/2024  Patient will be independent with their HEP to promote self-management of their condition and support progression toward functional goals. Baseline: HEP given at eval  Goal status: INITIAL  2.  Patient will report a decrease in worst pain to 7/10 on the 0-10 scale to allow for improved participation in functional activities  Baseline: 10/10 Goal status: INITIAL   LONG TERM GOALS: Target date: 05/02/2024  Pt will tolerate standing and seated activities for no less than 30 minutes for improved comfort and participation in daily activities  Baseline: Goal status: INITIAL  2.  Patient will report a decrease in worst pain to 5/10 and baseline to no more than 3/10 on the 0-10 scale to allow for improved participation in functional activities  Baseline:  Goal status: INITIAL  3.  Pt will improve all lumbar ROM to at least 75% of WNL for improved functional mobility and comfort with daily activities Baseline: see ROM chart Goal status: INITIAL  4.  Patient will improve performance on 30 sec STS from baseline of 3 to 8 to reflect improved functional mobility and show clinically significant change in  status. Baseline: 3 Goal status: INITIAL  5.  Pt will improve score on Modified ODI from baseline of 35 to 22 to reflect significant change in perceived ability to perform functional tasks and demonstrate progress towards improving functional abilities Baseline: 35/50 Goal status: INITIAL    PLAN:  PT FREQUENCY: 2x/week  PT DURATION: 8 weeks  PLANNED INTERVENTIONS: 97110-Therapeutic exercises, 97530- Therapeutic activity, 97112- Neuromuscular re-education, 97535- Self Care, and 02859- Manual therapy.  PLAN FOR NEXT SESSION: Plan to assess response to HEP, review POC, continue and add exercises focused on lumbar mobility, continue with neural mobilizations.   Mabel JONELLE Kiang PT  04/05/24 6:23 PM

## 2024-04-12 ENCOUNTER — Ambulatory Visit: Admitting: Physical Therapy

## 2024-04-18 ENCOUNTER — Ambulatory Visit

## 2024-04-18 DIAGNOSIS — M6281 Muscle weakness (generalized): Secondary | ICD-10-CM

## 2024-04-18 DIAGNOSIS — R2689 Other abnormalities of gait and mobility: Secondary | ICD-10-CM

## 2024-04-18 DIAGNOSIS — M5459 Other low back pain: Secondary | ICD-10-CM | POA: Diagnosis not present

## 2024-04-18 NOTE — Therapy (Signed)
 OUTPATIENT PHYSICAL THERAPY TREATMENT   Patient Name: Suzanne [name] MRN: [id number] DOB:[birth year], 48 y.o., female Today's Date: 04/18/2024  END OF SESSION:  PT End of Session - 04/18/24 1123     Visit Number 5    Number of Visits 17    Date for Recertification  05/02/24    Authorization Type [employer] MCD    PT Start Time 1130    PT Stop Time 1208    PT Time Calculation (min) 38 min    Activity Tolerance Patient limited by pain    Behavior During Therapy WFL for tasks assessed/performed              Past Medical History:  Diagnosis Date   Anemia    Diabetes mellitus without complication (HCC)    GERD (gastroesophageal reflux disease)    Headache    History of kidney stones    Hypertension    Sciatica    Past Surgical History:  Procedure Laterality Date   CERVICAL BIOPSY     CESAREAN SECTION N/A 05/15/2016   Procedure: CESAREAN SECTION;  Surgeon: Norleen LULLA Server, MD;  Location: [facility];  Service: Obstetrics;  Laterality: N/A;   COLPOSCOPY     CYSTOSCOPY WITH RETROGRADE PYELOGRAM, URETEROSCOPY AND STENT PLACEMENT Left 05/03/2023   Procedure: CYSTOSCOPY WITH RETROGRADE PYELOGRAM, URETEROSCOPY AND STENT PLACEMENT;  Surgeon: Renda Glance, MD;  Location: [facility];  Service: Urology;  Laterality: Left;   EXTRACORPOREAL SHOCK WAVE LITHOTRIPSY Left 05/31/2023   Procedure: EXTRACORPOREAL SHOCK WAVE LITHOTRIPSY (ESWL);  Surgeon: Shona Layman BROCKS, MD;  Location: [facility];  Service: Urology;  Laterality: Left;   LUMBAR LAMINECTOMY/ DECOMPRESSION WITH MET-RX Left 10/19/2022   Procedure: Left Lumbar five-Sacral one Microlaminectomy and discectomy;  Surgeon: Cheryle Debby LABOR, MD;  Location: [facility];  Service: Neurosurgery;  Laterality: Left;   LUMBAR LAMINECTOMY/ DECOMPRESSION WITH MET-RX Left 04/27/2023   Procedure: Revision Left Minimally Invasive Lumbar Five-Sacral One Discectomy;  Surgeon: Cheryle Debby LABOR, MD;  Location: [facility];  Service:  Neurosurgery;  Laterality: Left;  3C   Patient Active Problem List   Diagnosis Date Noted   Left ureteral stone 05/03/2023   Intractable pain 05/03/2023   Hypokalemia 05/03/2023   AKI (acute kidney injury) 05/02/2023   Herniation of lumbar intervertebral disc with radiculopathy 04/27/2023   GERD (gastroesophageal reflux disease) 01/09/2016   Hypertension, benign 01/09/2016    PCP: Sim Emery CROME, MD  REFERRING PROVIDER: Debby Dorn MATSU, MD  REFERRING DIAG: M51.16 (ICD-10-CM) - Intervertebral disc disorders with radiculopathy, lumbar region   Rationale for Evaluation and Treatment: Rehabilitation  THERAPY DIAG:  Other low back pain  Muscle weakness (generalized)  Other abnormalities of gait and mobility  ONSET DATE: chronic  SUBJECTIVE:  SUBJECTIVE STATEMENT: Pt presents to PT with reports of continued L lower back pain that refers into L LE. Will see MD office on 04/28/24.  EVAL: Pt reports to physical therapy for evaluation and treatment of left sided LBP with L radicular pain that started April with no clear activity that lead to the onset. Pt states it can come and go, she has experienced similar pain in the past which has lead to multiple back surgeries at the L5-S1 region. Received short term relief from surgery but it did not last long. Pt takes gabapentin  and tylenol  and gets some relief. Most mornings she wakes up in a lot of pain and has to take medication. Can walk longer distances when on medication. Pt reports she has to sleep on R side but still finds little comfort, not been sleeping well. She has a 45 year old child that she cares for  PERTINENT HISTORY:  HTN DM L5-S1 discectomy and laminectomy (2024)  PAIN:  Are you having pain?  Yes: NPRS scale: 5/10 current, 10/10 worst  - takes ~ 20 min to come to baseline Pain location: back of thigh back, back of calf, 3-5 toes are numb Pain description: knife-like pain, deep, down back of leg Aggravating factors: sitting for long periods, bending over, standing for more than 15 minutes  Relieving factors: meds and rest (sometimes)  PRECAUTIONS: None  RED FLAGS: None   WEIGHT BEARING RESTRICTIONS: No  FALLS:  Has patient fallen in last 6 months? Yes. Number of falls 1, new shoes fell back wards   LIVING ENVIRONMENT: Lives with: lives with their family Lives in: House/apartment Stairs:  Has following equipment at home: Single point cane  OCCUPATION:   PLOF: Independent  PATIENT GOALS: to improve pain and participate more in functional activities  NEXT MD VISIT: Not scheduled  OBJECTIVE:  Note: Objective measures were completed at Evaluation unless otherwise noted.  DIAGNOSTIC FINDINGS:  See imaging  PATIENT SURVEYS:  Modified ODI: 35/50 04/18/2024: 34/50  COGNITION: Overall cognitive status: Within functional limits for tasks assessed     SENSATION: Diminished @ L5-S1  MUSCLE LENGTH: NT  POSTURE: rounded shoulders and forward head  PALPATION: NT  LUMBAR ROM:   AROM eval  Flexion 25%  Extension 25%  Right lateral flexion 50%  Left lateral flexion 25%  Right rotation   Left rotation    (Blank rows = not tested)  LOWER EXTREMITY ROM:     Active  Right eval Left eval  Hip flexion    Hip extension    Hip abduction    Hip adduction    Hip internal rotation    Hip external rotation    Knee flexion    Knee extension    Ankle dorsiflexion    Ankle plantarflexion    Ankle inversion    Ankle eversion     (Blank rows = not tested)  LOWER EXTREMITY MMT:    MMT Right eval Left eval  Hip flexion 4 3+  Hip extension    Hip abduction    Hip adduction    Hip internal rotation    Hip external rotation    Knee flexion 5 5  Knee extension 5 5  Ankle dorsiflexion 5 4  Ankle  plantarflexion 5 5  Ankle inversion    Ankle eversion     (Blank rows = not tested)  LUMBAR SPECIAL TESTS:  Straight leg raise test: Positive  FUNCTIONAL TESTS:  30 seconds chair stand test: 3  GAIT: Distance  walked: 69ft Assistive device utilized: Single point cane Level of assistance: Modified independence Comments: R lateral shift, slow and antalgic  TREATMENT DATE: [facility] Adult PT Treatment:                                                DATE: 04/18/2024 Therapeutic Exercise: Hooklying PPT x 10 - 5 hold Hooklying clamshell 2x15 GTB DKTC 2x30 Bridge (small range) 2x10 Therapeutic Activity: Assessment of tests/measures, goals, and outcomes  [facility] Adult PT Treatment:                                                DATE: 04/05/2024 Therapeutic Exercise: Supine SI join mobilization with dowel rod 2x10, 5s hold Passive IR/ER of L hip Gr I-IV L hip distraction Neuromuscular re-ed: Small range supine bridge 2x12, hold 2s Supine Sciatic glide 2x15  [facility] Adult PT Treatment:                                                DATE: 03/22/24 NuStep lvl 4 UE/LE x 4 min for functional activity tolerance Hooklying PPT x 10 - 5 hold Hooklying PPT with ball 2x10 Hooklying clamshell 2x10 GTB - increased pain LTR x 5 - R only  Manual Therapy:  Lumbar traction on physioball  TPR to L piriformis   [facility] Adult PT Treatment:                                                DATE: 03/15/24 NuStep lvl 4 UE/LE x 4 min for functional activity tolerance Hooklying PPT x 10 - 5 hold Hooklying clamshell 2x10 GTB LTR x 5 Hooklying ball squeeze 2x10 SKTC 3x30  Bridge (small range) 2x5 Piriformis stretch x 30 ea                               PATIENT EDUCATION:  Education details: Educated on eval findings, Performance of HEP, POC Person educated: Patient Education method: Programmer, multimedia, Facilities manager, and Handouts Education comprehension: verbalized understanding  HOME EXERCISE  PROGRAM: Access Code: [email or web address] URL: [email or web address] Date: 04/18/2024 Prepared by: Alm Kingdom  Exercises - Seated Sciatic Nerve Glide With Cervical Motion  - 1 x daily - 7 x weekly - 2-3 sets - 10 reps - Supine Lower Trunk Rotation  - 1 x daily - 7 x weekly - 2 sets - 10 reps - Supine Posterior Pelvic Tilt  - 1 x daily - 7 x weekly - 2 sets - 10 reps - 5 sec hold - Supine Double Knee to Chest  - 1 x daily - 7 x weekly - 2-3 sets - 30 sec hold - Hooklying Clamshell with Resistance  - 1 x daily - 7 x weekly - 3 sets - 15 reps - green band hold - Beginner Bridge  - 1 x daily - 7 x weekly - 2-3 sets - 10 reps  ASSESSMENT:  CLINICAL  IMPRESSION: Pt tolerated treatment fair and was able to complete prescribed exercises. Over the course of PT thus far she has not been able to progress very well, continuing to have severe pain and limited subjective function assessed via ODI. PT will await upcoming MD visit for patient on 10/3 in order to determine appropriate next steps in POC.   EVAL: Patient is a 48 y.o. Female who was seen today for physical therapy evaluation and treatment for low back pain with left sided radicular pain. Pt was in high level pain coming in today, ambulating slowly with SPC. Evaluation findings are consistent with MD diagnosis and impression from imaging findings. Pt having significant numbness and tingling sensation down into L foot with lumbar movements, a positive SLR, and diminished sensation of L5 and S1 dermatomes.  Patient-reported outcomes indicate high level of disability with required activities of the pt and 30 sec STS score of only 3 reveal large impairment in functional tolerance, strength, and safety. The pt will benefit from skilled physical therapy for a several weeks to address deficits and assess response to therapy in an effort to reduce her symptoms  OBJECTIVE IMPAIRMENTS: Abnormal gait, decreased activity tolerance, decreased mobility,  decreased strength, and pain.   ACTIVITY LIMITATIONS: carrying, lifting, bending, sitting, standing, squatting, sleeping, and stairs  PARTICIPATION LIMITATIONS: cleaning, laundry, driving, shopping, and community activity  PERSONAL FACTORS: Fitness, Past/current experiences, and 1-2 comorbidities: HTN and history of past lumbar surgeries are also affecting patient's functional outcome.   REHAB POTENTIAL: Fair chronicity and severity of symptoms, past history of back surgeries  CLINICAL DECISION MAKING: Stable/uncomplicated  EVALUATION COMPLEXITY: Low   GOALS: Goals reviewed with patient? No  SHORT TERM GOALS: Target date: 03/29/2024  Patient will be independent with their HEP to promote self-management of their condition and support progression toward functional goals. Baseline: HEP given at eval  Goal status: MET  2.  Patient will report a decrease in worst pain to 7/10 on the 0-10 scale to allow for improved participation in functional activities  Baseline: 10/10 Goal status: IN PROGRESS   LONG TERM GOALS: Target date: 05/02/2024  Pt will tolerate standing and seated activities for no less than 30 minutes for improved comfort and participation in daily activities  Baseline: 15 min 04/18/2024: 15 min Goal status: IN PROGRESS  2.  Patient will report a decrease in worst pain to 5/10 and baseline to no more than 3/10 on the 0-10 scale to allow for improved participation in functional activities  Baseline:  Goal status: IN PROGRESS  3.  Pt will improve all lumbar ROM to at least 75% of WNL for improved functional mobility and comfort with daily activities Baseline: see ROM chart Goal status: IN PROGRESS  4.  Patient will improve performance on 30 sec STS from baseline of 3 to 8 to reflect improved functional mobility and show clinically significant change in status. Baseline: 3 04/18/2024: 4 Goal status: IN PROGRESS  5.  Pt will improve score on Modified ODI from baseline of  35 to 22 to reflect significant change in perceived ability to perform functional tasks and demonstrate progress towards improving functional abilities Baseline: 35/50 04/18/2024: 34/50 Goal status: IN PROGRESS    PLAN:  PT FREQUENCY: 2x/week  PT DURATION: 8 weeks  PLANNED INTERVENTIONS: 97110-Therapeutic exercises, 97530- Therapeutic activity, 97112- Neuromuscular re-education, 97535- Self Care, and 02859- Manual therapy.  PLAN FOR NEXT SESSION: Plan to assess response to HEP, review POC, continue and add exercises focused on lumbar mobility, continue  with neural mobilizations.   Alm JAYSON Kingdom PT  04/18/24 1:00 PM

## 2024-05-04 ENCOUNTER — Emergency Department (HOSPITAL_BASED_OUTPATIENT_CLINIC_OR_DEPARTMENT_OTHER)
Admission: EM | Admit: 2024-05-04 | Discharge: 2024-05-04 | Disposition: A | Discharge status: Home / Self Care | Attending: Emergency Medicine | Admitting: Emergency Medicine

## 2024-05-04 ENCOUNTER — Emergency Department (HOSPITAL_BASED_OUTPATIENT_CLINIC_OR_DEPARTMENT_OTHER)

## 2024-05-04 ENCOUNTER — Emergency Department (HOSPITAL_BASED_OUTPATIENT_CLINIC_OR_DEPARTMENT_OTHER): Admitting: Radiology

## 2024-05-04 ENCOUNTER — Encounter (HOSPITAL_BASED_OUTPATIENT_CLINIC_OR_DEPARTMENT_OTHER): Payer: Self-pay

## 2024-05-04 ENCOUNTER — Other Ambulatory Visit: Payer: Self-pay

## 2024-05-04 DIAGNOSIS — M62838 Other muscle spasm: Secondary | ICD-10-CM | POA: Diagnosis not present

## 2024-05-04 DIAGNOSIS — Z91040 Latex allergy status: Secondary | ICD-10-CM | POA: Insufficient documentation

## 2024-05-04 DIAGNOSIS — M7918 Myalgia, other site: Secondary | ICD-10-CM

## 2024-05-04 DIAGNOSIS — Y92410 Unspecified street and highway as the place of occurrence of the external cause: Secondary | ICD-10-CM | POA: Insufficient documentation

## 2024-05-04 DIAGNOSIS — Z79899 Other long term (current) drug therapy: Secondary | ICD-10-CM | POA: Diagnosis not present

## 2024-05-04 DIAGNOSIS — Z7984 Long term (current) use of oral hypoglycemic drugs: Secondary | ICD-10-CM | POA: Insufficient documentation

## 2024-05-04 DIAGNOSIS — R519 Headache, unspecified: Secondary | ICD-10-CM | POA: Insufficient documentation

## 2024-05-04 DIAGNOSIS — M791 Myalgia, unspecified site: Secondary | ICD-10-CM | POA: Diagnosis not present

## 2024-05-04 DIAGNOSIS — M545 Low back pain, unspecified: Secondary | ICD-10-CM | POA: Diagnosis present

## 2024-05-04 MED ORDER — IBUPROFEN 600 MG PO TABS: 600.0000 mg | ORAL_TABLET | Freq: Four times a day (QID) | ORAL | 0 refills | Status: AC | PRN

## 2024-05-04 MED ORDER — OXYCODONE-ACETAMINOPHEN 5-325 MG PO TABS: 1.0000 | ORAL_TABLET | ORAL | 0 refills | Status: DC | PRN

## 2024-05-04 MED ORDER — METHOCARBAMOL 500 MG PO TABS
1000.0000 mg | ORAL_TABLET | Freq: Once | ORAL | Status: AC
  Administered 2024-05-04: 1000 mg via ORAL
  Filled 2024-05-04: qty 2

## 2024-05-04 MED ORDER — METHOCARBAMOL 500 MG PO TABS: 1000.0000 mg | ORAL_TABLET | Freq: Four times a day (QID) | ORAL | 0 refills | Status: DC

## 2024-05-04 MED ORDER — IBUPROFEN 400 MG PO TABS
600.0000 mg | ORAL_TABLET | Freq: Once | ORAL | Status: AC
  Administered 2024-05-04: 600 mg via ORAL
  Filled 2024-05-04: qty 1

## 2024-05-04 MED ORDER — OXYCODONE-ACETAMINOPHEN 5-325 MG PO TABS
2.0000 | ORAL_TABLET | Freq: Once | ORAL | Status: AC
  Administered 2024-05-04: 2 via ORAL
  Filled 2024-05-04: qty 2

## 2024-05-04 NOTE — ED Notes (Signed)
 Reviewed AVS/discharge instruction with patient. Time allotted for and all questions answered. Patient is agreeable for d/c and escorted to ed exit by staff.

## 2024-05-04 NOTE — Discharge Instructions (Signed)
 Take the medications as prescribed. Keep the already scheduled appointment with your neurosurgeon for Monday of next week for recheck of back pain.   Return to the ED as needed.
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
## 2024-05-18 ENCOUNTER — Ambulatory Visit: Admitting: Physical Therapy

## 2024-05-25 ENCOUNTER — Ambulatory Visit: Attending: Surgery

## 2024-05-25 DIAGNOSIS — M5459 Other low back pain: Secondary | ICD-10-CM | POA: Diagnosis present

## 2024-05-25 DIAGNOSIS — M6281 Muscle weakness (generalized): Secondary | ICD-10-CM | POA: Insufficient documentation

## 2024-05-25 DIAGNOSIS — R2689 Other abnormalities of gait and mobility: Secondary | ICD-10-CM | POA: Diagnosis present

## 2024-05-25 NOTE — Therapy (Unsigned)
 OUTPATIENT PHYSICAL THERAPY TREATMENT   Patient Name: Suzanne [name] MRN: [id number] DOB:07/11/1976, 48 y.o., female Today's Date: 05/25/2024  END OF SESSION:        Past Medical History:  Diagnosis Date   Anemia    Diabetes mellitus without complication (HCC)    GERD (gastroesophageal reflux disease)    Headache    History of kidney stones    Hypertension    Sciatica    Past Surgical History:  Procedure Laterality Date   CERVICAL BIOPSY     CESAREAN SECTION N/A 05/15/2016   Procedure: CESAREAN SECTION;  Surgeon: Norleen LULLA Server, MD;  Location: [facility];  Service: Obstetrics;  Laterality: N/A;   COLPOSCOPY     CYSTOSCOPY WITH RETROGRADE PYELOGRAM, URETEROSCOPY AND STENT PLACEMENT Left 05/03/2023   Procedure: CYSTOSCOPY WITH RETROGRADE PYELOGRAM, URETEROSCOPY AND STENT PLACEMENT;  Surgeon: Renda Glance, MD;  Location: [facility];  Service: Urology;  Laterality: Left;   EXTRACORPOREAL SHOCK WAVE LITHOTRIPSY Left 05/31/2023   Procedure: EXTRACORPOREAL SHOCK WAVE LITHOTRIPSY (ESWL);  Surgeon: Shona Layman BROCKS, MD;  Location: [facility];  Service: Urology;  Laterality: Left;   LUMBAR LAMINECTOMY/ DECOMPRESSION WITH MET-RX Left 10/19/2022   Procedure: Left Lumbar five-Sacral one Microlaminectomy and discectomy;  Surgeon: Cheryle Debby LABOR, MD;  Location: [facility] OR;  Service: Neurosurgery;  Laterality: Left;   LUMBAR LAMINECTOMY/ DECOMPRESSION WITH MET-RX Left 04/27/2023   Procedure: Revision Left Minimally Invasive Lumbar Five-Sacral One Discectomy;  Surgeon: Cheryle Debby LABOR, MD;  Location: [facility] OR;  Service: Neurosurgery;  Laterality: Left;  3C   Patient Active Problem List   Diagnosis Date Noted   Left ureteral stone 05/03/2023   Intractable pain 05/03/2023   Hypokalemia 05/03/2023   AKI (acute kidney injury) 05/02/2023   Herniation of lumbar intervertebral disc with radiculopathy 04/27/2023   GERD (gastroesophageal reflux disease) 01/09/2016    Hypertension, benign 01/09/2016    PCP: Sim Emery CROME, MD  REFERRING PROVIDER: Johnanna Camie Mates,*  REFERRING DIAG: M51.16 (ICD-10-CM) - Intervertebral disc disorders with radiculopathy, lumbar region   Rationale for Evaluation and Treatment: Rehabilitation  THERAPY DIAG:  Other low back pain  Muscle weakness (generalized)  Other abnormalities of gait and mobility  ONSET DATE: chronic  SUBJECTIVE:                                                                                                                                                                                           SUBJECTIVE STATEMENT: ***  EVAL: Pt reports to physical therapy for evaluation and treatment of left sided LBP with L radicular pain that started April with no clear activity that lead  to the onset. Pt states it can come and go, she has experienced similar pain in the past which has lead to multiple back surgeries at the L5-S1 region. Received short term relief from surgery but it did not last long. Pt takes gabapentin  and tylenol  and gets some relief. Most mornings she wakes up in a lot of pain and has to take medication. Can walk longer distances when on medication. Pt reports she has to sleep on R side but still finds little comfort, not been sleeping well. She has a 5 year old child that she cares for.  PERTINENT HISTORY:  HTN, DM II, L5-S1 discectomy and laminectomy (2024)  PAIN:  Are you having pain? Yes: 7/10 Worst: 10/10 Pain location: low back, L LE posterior  Pain description: deep ache, sharp Aggravating factors: sitting for long periods, bending over, standing for more than 15 minutes  Relieving factors: meds and rest (sometimes)  PRECAUTIONS: None  RED FLAGS: None   WEIGHT BEARING RESTRICTIONS: No  FALLS:  Has patient fallen in last 6 months? Yes. Number of falls 1, new shoes fell back wards   LIVING ENVIRONMENT: Lives with: lives with their family Lives in:  House/apartment Stairs:  Has following equipment at home: Single point cane  OCCUPATION:   PLOF: Independent  PATIENT GOALS: to improve pain and participate more in functional activities  NEXT MD VISIT: 08/07/2024  OBJECTIVE:  Note: Objective measures were completed at Evaluation unless otherwise noted.  DIAGNOSTIC FINDINGS:  See imaging  PATIENT SURVEYS:  Modified ODI: 35/50 04/18/2024: 34/50 05/25/2024: 35/50  COGNITION: Overall cognitive status: Within functional limits for tasks assessed     SENSATION: Diminished @ L5-S1  MUSCLE LENGTH: NT  POSTURE: rounded shoulders and forward head  PALPATION: NT  LUMBAR ROM:   AROM eval  Flexion 25%  Extension 25%  Right lateral flexion 50%  Left lateral flexion 25%  Right rotation   Left rotation    (Blank rows = not tested)  LOWER EXTREMITY ROM:     Active  Right eval Left eval  Hip flexion    Hip extension    Hip abduction    Hip adduction    Hip internal rotation    Hip external rotation    Knee flexion    Knee extension    Ankle dorsiflexion    Ankle plantarflexion    Ankle inversion    Ankle eversion     (Blank rows = not tested)  LOWER EXTREMITY MMT:    MMT Right eval Left eval  Hip flexion 4 3+  Hip extension    Hip abduction    Hip adduction    Hip internal rotation    Hip external rotation    Knee flexion 5 5  Knee extension 5 5  Ankle dorsiflexion 5 4  Ankle plantarflexion 5 5  Ankle inversion    Ankle eversion     (Blank rows = not tested)  LUMBAR SPECIAL TESTS:  Straight leg raise test: Positive  FUNCTIONAL TESTS:  30 seconds chair stand test: 3  GAIT: Distance walked: 19ft Assistive device utilized: Single point cane Level of assistance: Modified independence Comments: R lateral shift, slow and antalgic  TREATMENT DATE: [facility] Adult PT Treatment:  DATE: 05/25/2024 Therapeutic Exercise: Hooklying PPT x 10 - 5  hold Hooklying clamshell 2x15 GTB DKTC 2x30 Bridge (small range) 2x10 Therapeutic Activity: Assessment of tests/measures, goals, and outcomes  [facility] Adult PT Treatment:                                                DATE: 04/18/2024 Therapeutic Exercise: Hooklying PPT x 10 - 5 hold Hooklying clamshell 2x15 GTB DKTC 2x30 Bridge (small range) 2x10 Therapeutic Activity: Assessment of tests/measures, goals, and outcomes  [facility] Adult PT Treatment:                                                DATE: 04/05/2024 Therapeutic Exercise: Supine SI join mobilization with dowel rod 2x10, 5s hold Passive IR/ER of L hip Gr I-IV L hip distraction Neuromuscular re-ed: Small range supine bridge 2x12, hold 2s Supine Sciatic glide 2x15  [facility] Adult PT Treatment:                                                DATE: 03/22/24 NuStep lvl 4 UE/LE x 4 min for functional activity tolerance Hooklying PPT x 10 - 5 hold Hooklying PPT with ball 2x10 Hooklying clamshell 2x10 GTB - increased pain LTR x 5 - R only  Manual Therapy:  Lumbar traction on physioball  TPR to L piriformis   [facility] Adult PT Treatment:                                                DATE: 03/15/24 NuStep lvl 4 UE/LE x 4 min for functional activity tolerance Hooklying PPT x 10 - 5 hold Hooklying clamshell 2x10 GTB LTR x 5 Hooklying ball squeeze 2x10 SKTC 3x30  Bridge (small range) 2x5 Piriformis stretch x 30 ea                               PATIENT EDUCATION:  Education details: Educated on eval findings, Performance of HEP, POC Person educated: Patient Education method: Programmer, Multimedia, Facilities Manager, and Handouts Education comprehension: verbalized understanding  HOME EXERCISE PROGRAM: Access Code: [email or web address] URL: [email or web address] Date: 05/25/2024 Prepared by: Alm Kingdom  Exercises - Supine Posterior Pelvic Tilt  - 1 x daily - 7 x weekly - 2 sets - 10 reps - 5 sec hold - Supine Double Knee to Chest   - 1 x daily - 7 x weekly - 2-3 sets - 30 sec hold - Hooklying Clamshell with Resistance  - 1 x daily - 7 x weekly - 3 sets - 15 reps - green band hold - Hooklying Single Knee to Chest Stretch  - 1 x daily - 7 x weekly - 2-3 reps - 30 sec hold  ASSESSMENT:  CLINICAL IMPRESSION: ***  EVAL: Patient is a 48 y.o. Female who was seen today for physical therapy evaluation and treatment for low back pain with left sided radicular pain. Pt  was in high level pain coming in today, ambulating slowly with SPC. Evaluation findings are consistent with MD diagnosis and impression from imaging findings. Pt having significant numbness and tingling sensation down into L foot with lumbar movements, a positive SLR, and diminished sensation of L5 and S1 dermatomes.  Patient-reported outcomes indicate high level of disability with required activities of the pt and 30 sec STS score of only 3 reveal large impairment in functional tolerance, strength, and safety. The pt will benefit from skilled physical therapy for a several weeks to address deficits and assess response to therapy in an effort to reduce her symptoms  OBJECTIVE IMPAIRMENTS: Abnormal gait, decreased activity tolerance, decreased mobility, decreased strength, and pain.   ACTIVITY LIMITATIONS: carrying, lifting, bending, sitting, standing, squatting, sleeping, and stairs  PARTICIPATION LIMITATIONS: cleaning, laundry, driving, shopping, and community activity  PERSONAL FACTORS: Fitness, Past/current experiences, and 1-2 comorbidities: HTN and history of past lumbar surgeries are also affecting patient's functional outcome.   REHAB POTENTIAL: Fair chronicity and severity of symptoms, past history of back surgeries  CLINICAL DECISION MAKING: Stable/uncomplicated  EVALUATION COMPLEXITY: Low   GOALS: Goals reviewed with patient? No  SHORT TERM GOALS: Target date: 03/29/2024  Patient will be independent with their HEP to promote self-management of their  condition and support progression toward functional goals. Baseline: HEP given at eval  Goal status: MET  2.  Patient will report a decrease in worst pain to 7/10 on the 0-10 scale to allow for improved participation in functional activities  Baseline: 10/10 Goal status: IN PROGRESS   LONG TERM GOALS: Target date: 05/02/2024  Pt will tolerate standing and seated activities for no less than 30 minutes for improved comfort and participation in daily activities  Baseline: 15 min 04/18/2024: 15 min Goal status: IN PROGRESS  2.  Patient will report a decrease in worst pain to 5/10 and baseline to no more than 3/10 on the 0-10 scale to allow for improved participation in functional activities  Baseline:  Goal status: IN PROGRESS  3.  Pt will improve all lumbar ROM to at least 75% of WNL for improved functional mobility and comfort with daily activities Baseline: see ROM chart Goal status: IN PROGRESS  4.  Patient will improve performance on 30 sec STS from baseline of 3 to 8 to reflect improved functional mobility and show clinically significant change in status. Baseline: 3 04/18/2024: 4 Goal status: IN PROGRESS  5.  Pt will improve score on Modified ODI from baseline of 35 to 22 to reflect significant change in perceived ability to perform functional tasks and demonstrate progress towards improving functional abilities Baseline: 35/50 04/18/2024: 34/50 Goal status: IN PROGRESS    PLAN:  PT FREQUENCY: 2x/week  PT DURATION: 8 weeks  PLANNED INTERVENTIONS: 97110-Therapeutic exercises, 97530- Therapeutic activity, 97112- Neuromuscular re-education, 97535- Self Care, and [id number]- Manual therapy.  PLAN FOR NEXT SESSION: Plan to assess response to HEP, review POC, continue and add exercises focused on lumbar mobility, continue with neural mobilizations.   Alm JAYSON Kingdom PT  05/25/24 12:57 PM

## 2024-05-29 ENCOUNTER — Ambulatory Visit: Admitting: Physical Therapy

## 2024-06-02 ENCOUNTER — Telehealth: Payer: Self-pay

## 2024-06-02 ENCOUNTER — Ambulatory Visit: Attending: Surgery

## 2024-06-02 DIAGNOSIS — M5459 Other low back pain: Secondary | ICD-10-CM | POA: Insufficient documentation

## 2024-06-02 DIAGNOSIS — R2689 Other abnormalities of gait and mobility: Secondary | ICD-10-CM | POA: Insufficient documentation

## 2024-06-02 DIAGNOSIS — M6281 Muscle weakness (generalized): Secondary | ICD-10-CM | POA: Insufficient documentation
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
## 2024-06-02 NOTE — Telephone Encounter (Signed)
 Spoke to patient regarding missed visit, she had a family emergency and forgot to cancel the appointment.  1st no-show  Suzanne [name], [place] 06/02/24 9:59 AM

## 2024-06-05 ENCOUNTER — Encounter: Payer: Self-pay | Admitting: Physical Therapy

## 2024-06-05 ENCOUNTER — Ambulatory Visit: Admitting: Physical Therapy

## 2024-06-05 DIAGNOSIS — M5459 Other low back pain: Secondary | ICD-10-CM | POA: Diagnosis present

## 2024-06-05 DIAGNOSIS — M6281 Muscle weakness (generalized): Secondary | ICD-10-CM | POA: Diagnosis present

## 2024-06-05 DIAGNOSIS — R2689 Other abnormalities of gait and mobility: Secondary | ICD-10-CM
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
## 2024-06-05 NOTE — Patient Instructions (Signed)
 Aquatic Therapy at [facility]-  What to Expect!  Where:   [facility] [address] [place], [place] [address] Rehab phone [phone]  NOTE:  You will receive an automated phone message reminding you of your appt and it will say the appointment is at the [address] [facility] clinic.          How to Prepare: Please make sure you drink 8 ounces of water about one hour prior to your pool session A caregiver may attend if needed with the patient to help assist as needed. A caregiver can sit in the pool room on chair. Please arrive IN YOUR SUIT and 15 minutes prior to your appointment - this helps to avoid delays in starting your session. Please make sure to attend to any toileting needs prior to entering the pool [place] rooms for changing are provided.   There is direct access to the pool deck form the locker room.  You can lock your belongings in a locker with lock provided. Once on the pool deck your therapist will ask if you have signed the Patient  Consent and Assignment of Benefits form before beginning treatment Your therapist may take your blood pressure prior to, during and after your session if indicated We usually try and create a home exercise program based on activities we do in the pool.  Please be thinking about who might be able to assist you in the pool should you need to participate in an aquatic home exercise program at the time of discharge if you need assistance.  Some patients do not want to or do not have the ability to participate in an aquatic home program - this is not a barrier in any way to you participating in aquatic therapy as part of your current therapy plan! After Discharge from PT, you can continue using home program at  the [facility], there is a drop-in fee for $5 ($45 a month)or for 60 years  or older $4.00 ($40 a month for seniors ) or any local [employer] pool.  Memberships for purchase are  available for gym/pool at [facility]  IT IS VERY IMPORTANT THAT YOUR LAST VISIT BE IN THE CLINIC AT [facility] [address] AFTER YOUR LAST AQUATIC VISIT.  PLEASE MAKE SURE THAT YOU HAVE A [address]  APPOINTMENT SCHEDULED.   About the pool: Pool is located approximately 500 FT from the entrance of the building.  Please bring a support person if you need assistance traveling this      distance.   Your therapist will assist you in entering the water; there are two ways to           enter: stairs with railings, and a mechanical lift. Your therapist will determine the most appropriate way for you.  Water temperature is usually between 88-90 degrees  There may be up to 2 other swimmers in the pool at the same time  The pool deck is tile, please wear shoes with good traction if you prefer not to be barefoot.    Contact Info:  For appointment scheduling and cancellations:         Please call the [facility]  PH:[phone]              Aquatic Therapy  Outpatient Rehabilitation @ [facility]       All sessions are 45 minutes

## 2024-06-09 ENCOUNTER — Ambulatory Visit
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
## 2024-06-12 ENCOUNTER — Ambulatory Visit: Admitting: Physical Therapy

## 2024-06-19 ENCOUNTER — Encounter: Payer: Self-pay | Admitting: Physical Therapy

## 2024-06-19 ENCOUNTER — Ambulatory Visit: Admitting: Physical Therapy

## 2024-06-19 DIAGNOSIS — M5459 Other low back pain: Secondary | ICD-10-CM

## 2024-06-19 DIAGNOSIS — M6281 Muscle weakness (generalized): Secondary | ICD-10-CM

## 2024-06-19 DIAGNOSIS — R2689 Other abnormalities of gait and mobility: Secondary | ICD-10-CM
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
## 2024-07-18 ENCOUNTER — Other Ambulatory Visit: Payer: Self-pay

## 2024-07-18 ENCOUNTER — Emergency Department (HOSPITAL_BASED_OUTPATIENT_CLINIC_OR_DEPARTMENT_OTHER)
Admission: EM | Admit: 2024-07-18 | Discharge: 2024-07-18 | Disposition: A | Discharge status: Home / Self Care | Attending: Emergency Medicine | Admitting: Emergency Medicine

## 2024-07-18 ENCOUNTER — Emergency Department (HOSPITAL_BASED_OUTPATIENT_CLINIC_OR_DEPARTMENT_OTHER)

## 2024-07-18 DIAGNOSIS — G8929 Other chronic pain: Secondary | ICD-10-CM | POA: Diagnosis not present

## 2024-07-18 DIAGNOSIS — Z91040 Latex allergy status: Secondary | ICD-10-CM | POA: Insufficient documentation

## 2024-07-18 DIAGNOSIS — I10 Essential (primary) hypertension: Secondary | ICD-10-CM | POA: Insufficient documentation

## 2024-07-18 DIAGNOSIS — Z7984 Long term (current) use of oral hypoglycemic drugs: Secondary | ICD-10-CM | POA: Diagnosis not present

## 2024-07-18 DIAGNOSIS — E119 Type 2 diabetes mellitus without complications: Secondary | ICD-10-CM | POA: Insufficient documentation

## 2024-07-18 DIAGNOSIS — Z79899 Other long term (current) drug therapy: Secondary | ICD-10-CM | POA: Diagnosis not present

## 2024-07-18 DIAGNOSIS — M5442 Lumbago with sciatica, left side: Secondary | ICD-10-CM | POA: Diagnosis present

## 2024-07-18 DIAGNOSIS — M25572 Pain in left ankle and joints of left foot: Secondary | ICD-10-CM | POA: Insufficient documentation

## 2024-07-18 MED ORDER — OXYCODONE HCL 5 MG PO TABS
5.0000 mg | ORAL_TABLET | Freq: Once | ORAL | Status: AC
  Administered 2024-07-18: 5 mg via ORAL
  Filled 2024-07-18: qty 1

## 2024-07-18 MED ORDER — IBUPROFEN 800 MG PO TABS
800.0000 mg | ORAL_TABLET | Freq: Once | ORAL | Status: AC
  Administered 2024-07-18: 800 mg via ORAL
  Filled 2024-07-18: qty 1

## 2024-07-18 MED ORDER — KETOROLAC TROMETHAMINE 15 MG/ML IJ SOLN: 15.0000 mg | Freq: Once | INTRAMUSCULAR | Status: DC

## 2024-07-18 MED ORDER — GABAPENTIN 300 MG PO CAPS: 300.0000 mg | ORAL_CAPSULE | Freq: Three times a day (TID) | ORAL | 0 refills | Status: AC

## 2024-07-18 MED ORDER — OXYCODONE HCL 5 MG PO TABS: 5.0000 mg | ORAL_TABLET | Freq: Four times a day (QID) | ORAL | 0 refills | Status: AC | PRN

## 2024-07-18 MED ORDER — METHOCARBAMOL 500 MG PO TABS: 500.0000 mg | ORAL_TABLET | Freq: Two times a day (BID) | ORAL | 0 refills | Status: AC | PRN

## 2024-07-18 MED ORDER — METHOCARBAMOL 500 MG PO TABS
500.0000 mg | ORAL_TABLET | Freq: Once | ORAL | Status: AC
  Administered 2024-07-18: 500 mg via ORAL
  Filled 2024-07-18: qty 1

## 2024-07-18 MED ORDER — LIDOCAINE 5 % EX PTCH
1.0000 | MEDICATED_PATCH | CUTANEOUS | Status: DC
  Administered 2024-07-18: 1 via TRANSDERMAL
  Filled 2024-07-18: qty 1

## 2024-07-18 MED ORDER — KETOROLAC TROMETHAMINE 15 MG/ML IJ SOLN
15.0000 mg | Freq: Once | INTRAMUSCULAR | Status: DC
  Filled 2024-07-18: qty 1

## 2024-07-18 NOTE — ED Notes (Signed)
 Pt back pain with pain that runs down left leg.
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
## 2024-07-18 NOTE — Discharge Instructions (Addendum)
 It was a pleasure taking care of you today.  Based on your history and physical exam as well as imaging I feel you are safe for discharge.  Your physical exam today is consistent with sciatica.  Because of this I have sent in a few prescriptions to the pharmacy.  Recommend over-the-counter Tylenol  and ibuprofen  to help with your symptoms.  I have sent in a refill of your gabapentin  home medication and have also sent in a few pills of oxycodone  which is a narcotic pain medication. I have also sent in robaxin  which is a muscle relaxant medication. Please only take the oxycodone  for breakthrough pain and do not drive after taking this medication or the muscle relaxant (Robaxin ) as they may may you drowsy.  Recommend that you follow-up with your primary care provider as well as your spine specialist.  Please make them aware of your visit and all findings today.  If you experience any of the following symptoms including but not limited to groin numbness/tingling, issues with bowel/bladder incontinence, inability to walk, weakness, severe pain, or other concerning symptom please return the emergency department or seek further medical care.  Recommend follow-up with your spine surgeon or primary care within the next 1 to 2 weeks.  Sooner if symptoms warrant.
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
